# Patient Record
Sex: Female | Born: 1950
Health system: Southern US, Community
[De-identification: ages and names within clinical notes are randomized; demographics above are authoritative.]

## PROBLEM LIST (undated history)

## (undated) DIAGNOSIS — K219 Gastro-esophageal reflux disease without esophagitis: Secondary | ICD-10-CM

## (undated) DIAGNOSIS — J029 Acute pharyngitis, unspecified: Secondary | ICD-10-CM

## (undated) DIAGNOSIS — R05 Cough: Secondary | ICD-10-CM

## (undated) DIAGNOSIS — H269 Unspecified cataract: Secondary | ICD-10-CM

## (undated) DIAGNOSIS — M549 Dorsalgia, unspecified: Secondary | ICD-10-CM

## (undated) DIAGNOSIS — T7840XA Allergy, unspecified, initial encounter: Secondary | ICD-10-CM

## (undated) DIAGNOSIS — C50919 Malignant neoplasm of unspecified site of unspecified female breast: Secondary | ICD-10-CM

## (undated) DIAGNOSIS — R140 Abdominal distension (gaseous): Secondary | ICD-10-CM

## (undated) DIAGNOSIS — M5440 Lumbago with sciatica, unspecified side: Secondary | ICD-10-CM

## (undated) DIAGNOSIS — F419 Anxiety disorder, unspecified: Secondary | ICD-10-CM

## (undated) DIAGNOSIS — R109 Unspecified abdominal pain: Secondary | ICD-10-CM

## (undated) DIAGNOSIS — M1990 Unspecified osteoarthritis, unspecified site: Secondary | ICD-10-CM

## (undated) DIAGNOSIS — R059 Cough, unspecified: Secondary | ICD-10-CM

## (undated) DIAGNOSIS — I499 Cardiac arrhythmia, unspecified: Secondary | ICD-10-CM

## (undated) DIAGNOSIS — R599 Enlarged lymph nodes, unspecified: Secondary | ICD-10-CM

## (undated) DIAGNOSIS — E039 Hypothyroidism, unspecified: Secondary | ICD-10-CM

## (undated) DIAGNOSIS — R002 Palpitations: Secondary | ICD-10-CM

## (undated) DIAGNOSIS — R569 Unspecified convulsions: Secondary | ICD-10-CM

## (undated) DIAGNOSIS — R0981 Nasal congestion: Secondary | ICD-10-CM

## (undated) DIAGNOSIS — C801 Malignant (primary) neoplasm, unspecified: Secondary | ICD-10-CM

## (undated) HISTORY — PX: SPINE SURGERY: SHX786

## (undated) HISTORY — DX: Abdominal distension (gaseous): R14.0

## (undated) HISTORY — DX: Malignant neoplasm of unspecified site of unspecified female breast: C50.919

## (undated) HISTORY — DX: Palpitations: R00.2

## (undated) HISTORY — DX: Unspecified abdominal pain: R10.9

## (undated) HISTORY — DX: Nasal congestion: R09.81

## (undated) HISTORY — DX: Anxiety disorder, unspecified: F41.9

## (undated) HISTORY — DX: Acute pharyngitis, unspecified: J02.9

## (undated) HISTORY — PX: BREAST SURGERY: SHX581

## (undated) HISTORY — DX: Enlarged lymph nodes, unspecified: R59.9

## (undated) HISTORY — DX: Cough: R05

## (undated) HISTORY — PX: WISDOM TOOTH EXTRACTION: SHX21

## (undated) HISTORY — PX: EYE SURGERY: SHX253

## (undated) HISTORY — DX: Allergy, unspecified, initial encounter: T78.40XA

## (undated) HISTORY — DX: Cough, unspecified: R05.9

## (undated) HISTORY — PX: BACK SURGERY: SHX140

## (undated) HISTORY — DX: Unspecified cataract: H26.9

---

## 2001-06-15 ENCOUNTER — Ambulatory Visit (HOSPITAL_COMMUNITY): Admission: RE | Admit: 2001-06-15 | Discharge: 2001-06-15 | Payer: Self-pay | Admitting: Gastroenterology

## 2002-06-22 ENCOUNTER — Ambulatory Visit (HOSPITAL_COMMUNITY): Admission: RE | Admit: 2002-06-22 | Discharge: 2002-06-22 | Payer: Self-pay | Admitting: Chiropractor

## 2002-06-22 ENCOUNTER — Encounter: Payer: Self-pay | Admitting: Chiropractor

## 2004-01-31 ENCOUNTER — Emergency Department (HOSPITAL_COMMUNITY): Admission: EM | Admit: 2004-01-31 | Discharge: 2004-01-31 | Payer: Self-pay | Admitting: Emergency Medicine

## 2005-02-24 ENCOUNTER — Other Ambulatory Visit: Admission: RE | Admit: 2005-02-24 | Discharge: 2005-02-24 | Payer: Self-pay | Admitting: Obstetrics and Gynecology

## 2005-04-28 ENCOUNTER — Encounter: Admission: RE | Admit: 2005-04-28 | Discharge: 2005-04-28 | Payer: Self-pay | Admitting: Gastroenterology

## 2005-08-19 ENCOUNTER — Encounter: Admission: RE | Admit: 2005-08-19 | Discharge: 2005-08-19 | Payer: Self-pay | Admitting: Gastroenterology

## 2010-03-14 ENCOUNTER — Ambulatory Visit (HOSPITAL_COMMUNITY)
Admission: RE | Admit: 2010-03-14 | Discharge: 2010-03-14 | Payer: Self-pay | Source: Home / Self Care | Attending: Physical Medicine and Rehabilitation | Admitting: Physical Medicine and Rehabilitation

## 2010-04-13 ENCOUNTER — Encounter: Payer: Self-pay | Admitting: Physical Medicine and Rehabilitation

## 2010-08-08 NOTE — Procedures (Signed)
[place]  Patient:    Virginia [name], Virginia [name] Visit Number: [id number] MRN: [id number]          Service Type: END Location: ENDO Attending Physician:  Charna Elizabeth Dictated by:   Anselmo Rod, M.D. Proc. Date: 06/15/01 Admit Date:  06/15/2001   CC:         Tammy R. Collins [place], M.D., [facility]   Procedure Report  DATE OF BIRTH:  1950-05-13  PROCEDURE PERFORMED:  Colonoscopy.  ENDOSCOPIST:  Anselmo Rod, M.D.  INSTRUMENT USED:  Olympus video colonoscope.  INDICATIONS:  Blood in stool and change in bowel habits in a 61 year old white female.  Rule out colonic polyps, masses, hemorrhoids, etc.  PREPROCEDURE PREPARATION:  Informed consent was procured from the patient. The patient was fasted for 8 hours prior to the procedure and prepped with a bottle of magnesium citrate and a gallon of NuLytely the night prior to the procedure.  PREPROCEDURE PHYSICAL:  Patient has stable vital signs.  NECK: Supple.  CHEST:  Clear to auscultation. S1, S2 regular.  ABDOMEN:  Soft with normal bowel sounds.  DESCRIPTION OF PROCEDURE:  The patient was placed in the left lateral decubitus position and sedated with 80 mg of fentanyl and 7 mg of Versed intravenously.  Once the patient was adequately sedated and maintained on low-flow oxygen and continuous cardiac monitoring, the Olympus video colonoscope was advanced from the rectum to the cecum with slight difficulty. The patient had discomfort with passage of the scope at the hepatic flexure. This may be secondary to adhesions.  No masses or polyps were seen.  The entire colonic mucosa seemed healthy with a normal vascular pattern.  There was no evidence of diverticulosis.  The appendiceal orifice and the ileocecal valve were clearly visualized and photographed.  Small internal hemorrhoids were appreciated on retroflexion in the rectum.  IMPRESSION: 1. Healthy-appearing colonic  mucosa up to the cecum except for small    nonbleeding, internal hemorrhoids. 2. Somewhat tortuous colon with slight difficulty in the passage of the scope    at the hepatic flexure, question adhesions.  RECOMMENDATIONS: 1. A high-fiber diet has been recommended for the patient. 2. Outpatient follow-up is advised in the next four weeks. 3. Repeat colorectal cancer screening was recommended in the next 5-10 years    unless the patient were develop any abnormal symptoms in the interim. Dictated by:   Anselmo Rod, M.D. Attending Physician:  Charna Elizabeth DD:  06/15/01 TD:  06/16/01 Job: [id number]

## 2011-10-03 ENCOUNTER — Encounter (HOSPITAL_COMMUNITY): Admission: EM | Disposition: A | Discharge status: Home / Self Care | Payer: Self-pay | Source: Home / Self Care

## 2011-10-03 ENCOUNTER — Encounter (HOSPITAL_COMMUNITY): Payer: Self-pay | Admitting: Anesthesiology

## 2011-10-03 ENCOUNTER — Emergency Department (HOSPITAL_COMMUNITY): Payer: Medicaid Other | Admitting: Anesthesiology

## 2011-10-03 ENCOUNTER — Emergency Department (HOSPITAL_COMMUNITY): Payer: Medicaid Other

## 2011-10-03 ENCOUNTER — Encounter (HOSPITAL_COMMUNITY): Payer: Self-pay | Admitting: Emergency Medicine

## 2011-10-03 ENCOUNTER — Ambulatory Visit (HOSPITAL_COMMUNITY)
Admission: EM | Admit: 2011-10-03 | Discharge: 2011-10-05 | Disposition: A | Discharge status: Home / Self Care | Payer: Medicaid Other | Attending: General Surgery | Admitting: General Surgery

## 2011-10-03 DIAGNOSIS — R1031 Right lower quadrant pain: Secondary | ICD-10-CM | POA: Insufficient documentation

## 2011-10-03 DIAGNOSIS — K3580 Unspecified acute appendicitis: Secondary | ICD-10-CM

## 2011-10-03 HISTORY — PX: APPENDECTOMY: SHX54

## 2011-10-03 HISTORY — DX: Lumbago with sciatica, unspecified side: M54.40

## 2011-10-03 HISTORY — PX: LAPAROSCOPIC APPENDECTOMY: SHX408

## 2011-10-03 HISTORY — DX: Dorsalgia, unspecified: M54.9

## 2011-10-03 LAB — CBC WITH DIFFERENTIAL/PLATELET
Basophils Relative: 0 % (ref 0–1)
Eosinophils Absolute: 0 10*3/uL (ref 0.0–0.7)
Eosinophils Relative: 0 % (ref 0–5)
HCT: 39.8 % (ref 36.0–46.0)
Hemoglobin: 13.7 g/dL (ref 12.0–15.0)
Lymphocytes Relative: 9 % — ABNORMAL LOW (ref 12–46)
Lymphs Abs: 1 10*3/uL (ref 0.7–4.0)
MCH: 31.1 pg (ref 26.0–34.0)
MCHC: 34.4 g/dL (ref 30.0–36.0)
MCV: 90.2 fL (ref 78.0–100.0)
Monocytes Relative: 6 % (ref 3–12)
Neutrophils Relative %: 85 % — ABNORMAL HIGH (ref 43–77)
RBC: 4.41 MIL/uL (ref 3.87–5.11)
RDW: 12.3 % (ref 11.5–15.5)
WBC: 10.9 10*3/uL — ABNORMAL HIGH (ref 4.0–10.5)

## 2011-10-03 LAB — COMPREHENSIVE METABOLIC PANEL
ALT: 24 U/L (ref 0–35)
Alkaline Phosphatase: 115 U/L (ref 39–117)
BUN: 8 mg/dL (ref 6–23)
CO2: 24 mEq/L (ref 19–32)
Calcium: 10.1 mg/dL (ref 8.4–10.5)
Creatinine, Ser: 0.55 mg/dL (ref 0.50–1.10)
GFR calc Af Amer: >90 mL/min (ref >90)
GFR calc non Af Amer: >90 mL/min (ref >90)
Glucose, Bld: 116 mg/dL — ABNORMAL HIGH (ref 70–99)
Potassium: 3.6 mEq/L (ref 3.5–5.1)
Sodium: 138 mEq/L (ref 135–145)
Total Bilirubin: 0.4 mg/dL (ref 0.3–1.2)
Total Protein: 7.3 g/dL (ref 6.0–8.3)

## 2011-10-03 LAB — LIPASE, BLOOD: Lipase: 26 U/L (ref 11–59)

## 2011-10-03 SURGERY — APPENDECTOMY, LAPAROSCOPIC
Anesthesia: General | Site: Abdomen | Wound class: Contaminated

## 2011-10-03 MED ORDER — VECURONIUM BROMIDE 10 MG IV SOLR
INTRAVENOUS | Status: DC | PRN
  Administered 2011-10-03: 5 mg via INTRAVENOUS

## 2011-10-03 MED ORDER — BUPIVACAINE-EPINEPHRINE 0.25% -1:200000 IJ SOLN
INTRAMUSCULAR | Status: DC | PRN
  Administered 2011-10-03: 15 mL

## 2011-10-03 MED ORDER — MORPHINE SULFATE 4 MG/ML IJ SOLN
4.0000 mg | Freq: Once | INTRAMUSCULAR | Status: AC
  Administered 2011-10-03: 4 mg via INTRAVENOUS
  Filled 2011-10-03: qty 1

## 2011-10-03 MED ORDER — ONDANSETRON HCL 4 MG/2ML IJ SOLN
4.0000 mg | Freq: Once | INTRAMUSCULAR | Status: AC
  Administered 2011-10-03: 4 mg via INTRAVENOUS
  Filled 2011-10-03: qty 2

## 2011-10-03 MED ORDER — MIDAZOLAM HCL 5 MG/5ML IJ SOLN
INTRAMUSCULAR | Status: DC | PRN
  Administered 2011-10-03: 2 mg via INTRAVENOUS

## 2011-10-03 MED ORDER — ONDANSETRON HCL 4 MG/2ML IJ SOLN
INTRAMUSCULAR | Status: DC | PRN
  Administered 2011-10-03: 4 mg via INTRAVENOUS

## 2011-10-03 MED ORDER — ONDANSETRON HCL 4 MG/2ML IJ SOLN
4.0000 mg | Freq: Once | INTRAMUSCULAR | Status: AC
  Administered 2011-10-03: 4 mg via INTRAVENOUS

## 2011-10-03 MED ORDER — ACETAMINOPHEN 10 MG/ML IV SOLN
INTRAVENOUS | Status: DC | PRN
  Administered 2011-10-03: 1000 mg via INTRAVENOUS

## 2011-10-03 MED ORDER — KCL IN DEXTROSE-NACL 20-5-0.45 MEQ/L-%-% IV SOLN
INTRAVENOUS | Status: AC
  Filled 2011-10-03: qty 1000

## 2011-10-03 MED ORDER — SUCCINYLCHOLINE CHLORIDE 20 MG/ML IJ SOLN
INTRAMUSCULAR | Status: DC | PRN
  Administered 2011-10-03: 140 mg via INTRAVENOUS

## 2011-10-03 MED ORDER — FENTANYL CITRATE 0.05 MG/ML IJ SOLN
INTRAMUSCULAR | Status: DC | PRN
  Administered 2011-10-03: 50 ug via INTRAVENOUS
  Administered 2011-10-03 (×3): 100 ug via INTRAVENOUS

## 2011-10-03 MED ORDER — DROPERIDOL 2.5 MG/ML IJ SOLN
INTRAMUSCULAR | Status: DC | PRN
  Administered 2011-10-03: 0.625 mg via INTRAVENOUS

## 2011-10-03 MED ORDER — PROPOFOL 10 MG/ML IV EMUL
INTRAVENOUS | Status: DC | PRN
  Administered 2011-10-03: 120 mg via INTRAVENOUS

## 2011-10-03 MED ORDER — SODIUM CHLORIDE 0.9 % IR SOLN
Status: DC | PRN
  Administered 2011-10-03: 1000 mL

## 2011-10-03 MED ORDER — SODIUM CHLORIDE 0.9 % IV SOLN
INTRAVENOUS | Status: DC | PRN
  Administered 2011-10-03: 18:00:00 via INTRAVENOUS

## 2011-10-03 MED ORDER — MORPHINE SULFATE 2 MG/ML IJ SOLN: 2.0000 mg | INTRAMUSCULAR | Status: DC | PRN

## 2011-10-03 MED ORDER — ZOLPIDEM TARTRATE 5 MG PO TABS
5.0000 mg | ORAL_TABLET | Freq: Every day | ORAL | Status: DC
  Administered 2011-10-04 (×2): 5 mg via ORAL
  Filled 2011-10-03 (×2): qty 1

## 2011-10-03 MED ORDER — MOXIFLOXACIN HCL IN NACL 400 MG/250ML IV SOLN
400.0000 mg | Freq: Once | INTRAVENOUS | Status: AC
  Administered 2011-10-03: 400 mg via INTRAVENOUS
  Filled 2011-10-03: qty 250

## 2011-10-03 MED ORDER — HYDROMORPHONE HCL PF 1 MG/ML IJ SOLN
0.2500 mg | INTRAMUSCULAR | Status: DC | PRN
  Administered 2011-10-03 (×2): 0.5 mg via INTRAVENOUS

## 2011-10-03 MED ORDER — METOCLOPRAMIDE HCL 5 MG/ML IJ SOLN
INTRAMUSCULAR | Status: DC | PRN
  Administered 2011-10-03: 10 mg via INTRAVENOUS

## 2011-10-03 MED ORDER — SODIUM CHLORIDE 0.9 % IV SOLN
Freq: Once | INTRAVENOUS | Status: AC
  Administered 2011-10-03: 1000 mL via INTRAVENOUS

## 2011-10-03 MED ORDER — LIDOCAINE HCL (CARDIAC) 20 MG/ML IV SOLN
INTRAVENOUS | Status: DC | PRN
  Administered 2011-10-03: 100 mg via INTRAVENOUS

## 2011-10-03 MED ORDER — ONDANSETRON HCL 4 MG/2ML IJ SOLN: 4.0000 mg | Freq: Once | INTRAMUSCULAR | Status: DC | PRN

## 2011-10-03 MED ORDER — OXYCODONE-ACETAMINOPHEN 5-325 MG PO TABS
1.0000 | ORAL_TABLET | ORAL | Status: DC | PRN
  Administered 2011-10-04: 1 via ORAL
  Administered 2011-10-04: 2 via ORAL
  Administered 2011-10-04: 1 via ORAL
  Administered 2011-10-04 (×2): 2 via ORAL
  Administered 2011-10-04: 1 via ORAL
  Administered 2011-10-05: 2 via ORAL
  Filled 2011-10-03 (×2): qty 2
  Filled 2011-10-03: qty 1
  Filled 2011-10-03 (×3): qty 2

## 2011-10-03 MED ORDER — ONDANSETRON HCL 4 MG/2ML IJ SOLN
INTRAMUSCULAR | Status: AC
  Filled 2011-10-03: qty 2

## 2011-10-03 MED ORDER — DEXAMETHASONE SODIUM PHOSPHATE 4 MG/ML IJ SOLN
INTRAMUSCULAR | Status: DC | PRN
  Administered 2011-10-03: 4 mg via INTRAVENOUS

## 2011-10-03 MED ORDER — IOHEXOL 300 MG/ML  SOLN
100.0000 mL | Freq: Once | INTRAMUSCULAR | Status: AC | PRN
  Administered 2011-10-03: 100 mL via INTRAVENOUS

## 2011-10-03 MED ORDER — IOHEXOL 300 MG/ML  SOLN: 20.0000 mL | INTRAMUSCULAR | Status: DC

## 2011-10-03 MED ORDER — KCL IN DEXTROSE-NACL 20-5-0.45 MEQ/L-%-% IV SOLN
INTRAVENOUS | Status: DC
  Administered 2011-10-03 – 2011-10-04 (×3): via INTRAVENOUS
  Filled 2011-10-03 (×5): qty 1000

## 2011-10-03 MED ORDER — GLYCOPYRROLATE 0.2 MG/ML IJ SOLN
INTRAMUSCULAR | Status: DC | PRN
  Administered 2011-10-03: .6 mg via INTRAVENOUS

## 2011-10-03 MED ORDER — ONDANSETRON HCL 4 MG/2ML IJ SOLN: 4.0000 mg | Freq: Four times a day (QID) | INTRAMUSCULAR | Status: DC | PRN

## 2011-10-03 MED ORDER — ACETAMINOPHEN 325 MG PO TABS: 650.0000 mg | ORAL_TABLET | ORAL | Status: DC | PRN

## 2011-10-03 MED ORDER — HEPARIN SODIUM (PORCINE) 5000 UNIT/ML IJ SOLN
5000.0000 [IU] | Freq: Three times a day (TID) | INTRAMUSCULAR | Status: DC
  Administered 2011-10-04 – 2011-10-05 (×4): 5000 [IU] via SUBCUTANEOUS
  Filled 2011-10-03 (×7): qty 1

## 2011-10-03 MED ORDER — LACTATED RINGERS IV SOLN
INTRAVENOUS | Status: DC | PRN
  Administered 2011-10-03: 19:00:00 via INTRAVENOUS

## 2011-10-03 MED ORDER — NEOSTIGMINE METHYLSULFATE 1 MG/ML IJ SOLN
INTRAMUSCULAR | Status: DC | PRN
  Administered 2011-10-03: 5 mg via INTRAVENOUS

## 2011-10-03 MED ORDER — ONDANSETRON HCL 4 MG PO TABS: 4.0000 mg | ORAL_TABLET | Freq: Four times a day (QID) | ORAL | Status: DC | PRN

## 2011-10-03 SURGICAL SUPPLY — 49 items
ADH SKN CLS APL DERMABOND .7 (GAUZE/BANDAGES/DRESSINGS) ×1
ADH SKN CLS LQ APL DERMABOND (GAUZE/BANDAGES/DRESSINGS) ×1
APPLIER CLIP ROT 10 11.4 M/L (STAPLE)
APR CLP MED LRG 11.4X10 (STAPLE)
BAG SPEC RTRVL LRG 6X4 10 (ENDOMECHANICALS) ×1
BLADE SURG ROTATE 9660 (MISCELLANEOUS) IMPLANT
CANISTER SUCTION 2500CC (MISCELLANEOUS) ×2 IMPLANT
CHLORAPREP W/TINT 26ML (MISCELLANEOUS) ×2 IMPLANT
CLIP APPLIE ROT 10 11.4 M/L (STAPLE) IMPLANT
CLOTH BEACON ORANGE TIMEOUT ST (SAFETY) ×2 IMPLANT
COVER SURGICAL LIGHT HANDLE (MISCELLANEOUS) ×2 IMPLANT
CUTTER LINEAR ENDO 35 ETS (STAPLE) IMPLANT
CUTTER LINEAR ENDO 35 ETS TH (STAPLE) ×2 IMPLANT
DECANTER SPIKE VIAL GLASS SM (MISCELLANEOUS) ×2 IMPLANT
DERMABOND ADHESIVE PROPEN (GAUZE/BANDAGES/DRESSINGS) ×1
DERMABOND ADVANCED (GAUZE/BANDAGES/DRESSINGS) ×1
DERMABOND ADVANCED .7 DNX12 (GAUZE/BANDAGES/DRESSINGS) ×1 IMPLANT
DERMABOND ADVANCED .7 DNX6 (GAUZE/BANDAGES/DRESSINGS) ×1 IMPLANT
DRAPE UTILITY 15X26 W/TAPE STR (DRAPE) ×4 IMPLANT
ELECT REM PT RETURN 9FT ADLT (ELECTROSURGICAL) ×2
ELECTRODE REM PT RTRN 9FT ADLT (ELECTROSURGICAL) ×1 IMPLANT
ENDOLOOP SUT PDS II  0 18 (SUTURE)
ENDOLOOP SUT PDS II 0 18 (SUTURE) IMPLANT
GLOVE BIO SURGEON STRL SZ8 (GLOVE) ×2 IMPLANT
GLOVE BIOGEL PI IND STRL 8 (GLOVE) ×1 IMPLANT
GLOVE BIOGEL PI INDICATOR 8 (GLOVE) ×1
GOWN PREVENTION PLUS XLARGE (GOWN DISPOSABLE) ×2 IMPLANT
GOWN STRL NON-REIN LRG LVL3 (GOWN DISPOSABLE) ×4 IMPLANT
KIT BASIN OR (CUSTOM PROCEDURE TRAY) ×2 IMPLANT
KIT ROOM TURNOVER OR (KITS) ×2 IMPLANT
NS IRRIG 1000ML POUR BTL (IV SOLUTION) ×2 IMPLANT
PAD ARMBOARD 7.5X6 YLW CONV (MISCELLANEOUS) ×4 IMPLANT
POUCH SPECIMEN RETRIEVAL 10MM (ENDOMECHANICALS) ×2 IMPLANT
RELOAD /EVU35 (ENDOMECHANICALS) IMPLANT
RELOAD CUTTER ETS 35MM STAND (ENDOMECHANICALS) IMPLANT
SCALPEL HARMONIC ACE (MISCELLANEOUS) ×2 IMPLANT
SET IRRIG TUBING LAPAROSCOPIC (IRRIGATION / IRRIGATOR) ×2 IMPLANT
SPECIMEN JAR SMALL (MISCELLANEOUS) ×2 IMPLANT
SUT VIC AB 4-0 PS2 27 (SUTURE) ×2 IMPLANT
SWAB COLLECTION DEVICE MRSA (MISCELLANEOUS) IMPLANT
TOWEL OR 17X24 6PK STRL BLUE (TOWEL DISPOSABLE) ×2 IMPLANT
TOWEL OR 17X26 10 PK STRL BLUE (TOWEL DISPOSABLE) ×2 IMPLANT
TRAY FOLEY CATH 14FR (SET/KITS/TRAYS/PACK) ×2 IMPLANT
TRAY LAPAROSCOPIC (CUSTOM PROCEDURE TRAY) ×2 IMPLANT
TROCAR HASSON GELL 12X100 (TROCAR) ×2 IMPLANT
TROCAR Z-THREAD FIOS 12X100MM (TROCAR) ×2 IMPLANT
TROCAR Z-THREAD FIOS 5X100MM (TROCAR) ×2 IMPLANT
TUBE ANAEROBIC SPECIMEN COL (MISCELLANEOUS) IMPLANT
WATER STERILE IRR 1000ML POUR (IV SOLUTION) ×2 IMPLANT

## 2011-10-03 NOTE — H&P (Signed)
Virginia [name] is an 61 y.o. female.   Chief Complaint: Abdominal pain HPI: Patient developed some vague lower abdominal pain a couple days ago. Last night, however, it worsened significantly. It was across her midabdomen. She had associated nausea and vomiting. She came to the emergency department for evaluation. Since that time it is more localized to the right lower quadrant. Evaluation in the emergency department included CT scan of the abdomen and pelvis which reveals an abnormal appendix with possible mucocele and appendicoliths present. No evidence of perforation.Of note, patient sees Dr. Valerie Roys for primary care and regarding her complex environmental allergies.  Past Medical History  Diagnosis Date  . Back pain with radiation   . Low back pain with sciatica     Past surgical history: Cesarean section  History reviewed. No pertinent family history. Social History:  reports that she has never smoked. She does not have any smokeless tobacco history on file. She reports that she drinks alcohol. She reports that she does not use illicit drugs.  Allergies:  Allergies  Allergen Reactions  . Phenergan (Promethazine) Other (See Comments)    Thinks it was a seizure     (Not in a hospital admission)  Results for orders placed during the hospital encounter of 10/03/11 (from the past 48 hour(s))  CBC WITH DIFFERENTIAL     Status: Abnormal   Collection Time   10/03/11 12:05 PM      Component Value Range Comment   WBC 10.9 (*) 4.0 - 10.5 K/uL    RBC 4.41  3.87 - 5.11 MIL/uL    Hemoglobin 13.7  12.0 - 15.0 g/dL    HCT 40.9  81.1 - 91.4 %    MCV 90.2  78.0 - 100.0 fL    MCH 31.1  26.0 - 34.0 pg    MCHC 34.4  30.0 - 36.0 g/dL    RDW 78.2  95.6 - 21.3 %    Platelets 248  150 - 400 K/uL    Neutrophils Relative 85 (*) 43 - 77 %    Neutro Abs 9.3 (*) 1.7 - 7.7 K/uL    Lymphocytes Relative 9 (*) 12 - 46 %    Lymphs Abs 1.0  0.7 - 4.0 K/uL    Monocytes Relative 6  3 - 12 %    Monocytes Absolute 0.6  0.1 - 1.0 K/uL    Eosinophils Relative 0  0 - 5 %    Eosinophils Absolute 0.0  0.0 - 0.7 K/uL    Basophils Relative 0  0 - 1 %    Basophils Absolute 0.0  0.0 - 0.1 K/uL   COMPREHENSIVE METABOLIC PANEL     Status: Abnormal   Collection Time   10/03/11 12:05 PM      Component Value Range Comment   Sodium 138  135 - 145 mEq/L    Potassium 3.6  3.5 - 5.1 mEq/L    Chloride 101  96 - 112 mEq/L    CO2 24  19 - 32 mEq/L    Glucose, Bld 116 (*) 70 - 99 mg/dL    BUN 8  6 - 23 mg/dL    Creatinine, Ser 0.86  0.50 - 1.10 mg/dL    Calcium 57.8  8.4 - 10.5 mg/dL    Total Protein 7.3  6.0 - 8.3 g/dL    Albumin 4.1  3.5 - 5.2 g/dL    AST 25  0 - 37 U/L    ALT 24  0 - 35  U/L    Alkaline Phosphatase 115  39 - 117 U/L    Total Bilirubin 0.4  0.3 - 1.2 mg/dL    GFR calc non Af Amer >90  >90 mL/min    GFR calc Af Amer >90  >90 mL/min   LIPASE, BLOOD     Status: Normal   Collection Time   10/03/11 12:05 PM      Component Value Range Comment   Lipase 26  11 - 59 U/L    Ct Abdomen Pelvis W Contrast  10/03/2011  *RADIOLOGY REPORT*  Clinical Data: Abdominal pain.  Nausea and vomiting.  Diarrhea.  CT ABDOMEN AND PELVIS WITH CONTRAST  Technique:  Multidetector CT imaging of the abdomen and pelvis was performed following the standard protocol during bolus administration of intravenous contrast.  Contrast: OMNIPAQUE IOHEXOL 300 MG/ML  SOLN  Comparison: None.  Findings: Mild periportal edema is seen, however no liver masses are identified.  No evidence of ascites or splenomegaly.  Mild gallbladder wall thickening is seen likely related to periportal edema.  There is no evidence of gallbladder dilatation or pericholecystic inflammatory changes.  The pancreas, adrenal glands, and kidneys are normal in appearance. No evidence of hydronephrosis.  No soft tissue masses or lymphadenopathy identified within the abdomen or pelvis.  Uterus and adnexa are unremarkable in appearance.  Small amount  of free fluid is noted in the pelvic cul-de-sac, which is nonspecific.  The appendix is enlarged and contains multiple appendicoliths.  The appendix measures up to 14 mm in diameter although there is no significant inflammatory changes seen within the periappendiceal fat.  No other inflammatory process or abscess identified.  No evidence of bowel wall thickening or dilatation.  IMPRESSION:  1. Enlarged appendix with multiple appendicoliths, but no significant periappendiceal inflammatory change. Differential diagnosis includes early acute appendicitis and mucocele. Recommend correlation with clinical exam and surgical consultation. 2.  Mild nonspecific periportal edema and small amount of pelvic ascites.  This may be seen with hepatocellular disease, and correlation with liver function test is recommended.  Original Report Authenticated By: Danae Orleans, M.D.    Review of Systems  Constitutional: Positive for malaise/fatigue.  HENT: Negative.   Eyes: Negative.   Respiratory: Negative.   Cardiovascular: Negative.   Gastrointestinal: Positive for nausea, vomiting and abdominal pain. Negative for constipation and blood in stool.  Genitourinary: Negative.   Musculoskeletal: Positive for back pain.       Back pain is chronic and lower back surgery is planned for the future with Dr. Yetta Barre from neurosurgery  Skin: Negative.   Neurological: Negative.   Endo/Heme/Allergies:       Significant environmental allergies    Blood pressure 142/79, pulse 61, temperature 98.1 F (36.7 C), temperature source Oral, resp. rate 18, SpO2 99.00%. Physical Exam  Constitutional: She is oriented to person, place, and time. She appears well-developed and well-nourished. No distress.  HENT:  Head: Normocephalic and atraumatic.  Mouth/Throat: No oropharyngeal exudate.  Eyes: EOM are normal. Pupils are equal, round, and reactive to light. No scleral icterus.  Neck: Normal range of motion. No tracheal deviation present.   Cardiovascular: Normal rate, regular rhythm, normal heart sounds and intact distal pulses.   No murmur heard. Respiratory: Effort normal and breath sounds normal. No stridor. No respiratory distress. She has no wheezes. She has no rales.  GI: Soft. She exhibits distension. There is tenderness. There is no rebound and no guarding.       Tenderness right lower quadrant  without guarding, no masses, mild distention, positive Rosvig's sign  Musculoskeletal: Normal range of motion.       Lower back discomfort  Neurological: She is alert and oriented to person, place, and time.       Speech fluent, mood appropriate  Skin: Skin is warm and dry.     Assessment/Plan Right lower quadrant abdominal pain with abnormal appendix on CT scan. Plan laparoscopic appendectomy. We'll give IV antibiotics. Procedure, risks, and benefits were discussed in detail with the patient and her husband. I also discussed the fact that this mucin collection may represent a tumor Of the appendix. We will check in pathology. I answered their questions.  Duy Lemming E 10/03/2011, 5:05 PM

## 2011-10-03 NOTE — Anesthesia Procedure Notes (Signed)
Procedure Name: Intubation Date/Time: 10/03/2011 6:12 PM Performed by: Wray Kearns A Pre-anesthesia Checklist: Patient identified, Timeout performed, Suction available, Emergency Drugs available and Patient being monitored Patient Re-evaluated:Patient Re-evaluated prior to inductionOxygen Delivery Method: Circle system utilized Preoxygenation: Pre-oxygenation with 100% oxygen Intubation Type: IV induction, Rapid sequence and Cricoid Pressure applied Laryngoscope Size: Mac and 3 Grade View: Grade I Tube type: Oral Tube size: 7.5 mm Number of attempts: 1 Airway Equipment and Method: Stylet Placement Confirmation: ETT inserted through vocal cords under direct vision,  breath sounds checked- equal and bilateral and positive ETCO2 Secured at: 22 cm Tube secured with: Tape Dental Injury: Teeth and Oropharynx as per pre-operative assessment

## 2011-10-03 NOTE — Preoperative (Signed)
Beta Blockers   Reason not to administer Beta Blockers:Not Applicable 

## 2011-10-03 NOTE — Transfer of Care (Signed)
Immediate Anesthesia Transfer of Care Note  Patient: Virginia [name]  Procedure(s) Performed: Procedure(s) (LRB): APPENDECTOMY LAPAROSCOPIC (N/A)  Patient Location: PACU  Anesthesia Type: General  Level of Consciousness: oriented, sedated, patient cooperative and responds to stimulation  Airway & Oxygen Therapy: Patient Spontanous Breathing and Patient connected to nasal cannula oxygen  Post-op Assessment: Report given to PACU RN, Post -op Vital signs reviewed and stable, Patient moving all extremities and Patient moving all extremities X 4  Post vital signs: Reviewed and stable  Complications: No apparent anesthesia complications

## 2011-10-03 NOTE — ED Notes (Signed)
Pt c/o upper abdominal pain onset 0030 with N/V. Pt reports loose stools x 6 within 24 hours.

## 2011-10-03 NOTE — Anesthesia Preprocedure Evaluation (Signed)
Anesthesia Evaluation  Patient identified by MRN, date of birth, ID band Patient awake    Reviewed: H&P , NPO status , Patient's Chart, lab work & pertinent test results  Airway Mallampati: I TM Distance: >3 FB Neck ROM: Full    Dental   Pulmonary          Cardiovascular     Neuro/Psych    GI/Hepatic   Endo/Other    Renal/GU      Musculoskeletal   Abdominal   Peds  Hematology   Anesthesia Other Findings   Reproductive/Obstetrics                           Anesthesia Physical Anesthesia Plan  ASA: II  Anesthesia Plan: General   Post-op Pain Management:    Induction: Intravenous, Rapid sequence and Cricoid pressure planned  Airway Management Planned: Oral ETT  Additional Equipment:   Intra-op Plan:   Post-operative Plan: Extubation in OR  Informed Consent: I have reviewed the patients History and Physical, chart, labs and discussed the procedure including the risks, benefits and alternatives for the proposed anesthesia with the patient or authorized representative who has indicated his/her understanding and acceptance.     Plan Discussed with: CRNA and Surgeon  Anesthesia Plan Comments:         Anesthesia Quick Evaluation

## 2011-10-03 NOTE — ED Provider Notes (Signed)
History     CSN: [id number]  Arrival date & time 10/03/11  1134   First MD Initiated Contact with Patient 10/03/11 1146      Chief Complaint  Patient presents with  . Abdominal Pain  . Emesis    (Consider location/radiation/quality/duration/timing/severity/associated sxs/prior treatment) HPI Comments: Started late last night with pain in the upper abdomen along with n/v and loose stools.  She believes she may have eaten "a bad piece of protein".  She has had similar episodes in the past but this seems to be much worse than what she experienced before.  Patient is a 61 y.o. female presenting with abdominal pain. The history is provided by the patient.  Abdominal Pain The primary symptoms of the illness include nausea. The primary symptoms of the illness do not include dysuria. The onset of the illness was sudden. The problem has been rapidly worsening.  The patient has had a change in bowel habit. Significant associated medical issues do not include gallstones.    History reviewed. No pertinent past medical history.  History reviewed. No pertinent past surgical history.  History reviewed. No pertinent family history.  History  Substance Use Topics  . Smoking status: Never Smoker   . Smokeless tobacco: Not on file  . Alcohol Use: Yes    OB History    Grav Para Term Preterm Abortions TAB SAB Ect Mult Living                  Review of Systems  Gastrointestinal: Positive for nausea.  Genitourinary: Negative for dysuria.  All other systems reviewed and are negative.    Allergies  Phenergan  Home Medications   Current Outpatient Rx  Name Route Sig Dispense Refill  . OXYCODONE-ACETAMINOPHEN 5-325 MG PO TABS Oral Take 0.5 tablets by mouth every 6 (six) hours as needed. pain    . THYROID 120 MG PO TABS Oral Take 120 mg by mouth daily.    Marland Kitchen ZOLPIDEM TARTRATE 10 MG PO TABS Oral Take 5 mg by mouth at bedtime. sleep      BP 147/83  Pulse 70  Temp 98.1 F (36.7 C)  (Oral)  Resp 18  SpO2 100%  Physical Exam  Nursing note and vitals reviewed. Constitutional: She is oriented to person, place, and time. She appears well-developed and well-nourished. No distress.  HENT:  Head: Normocephalic and atraumatic.  Neck: Normal range of motion. Neck supple.  Cardiovascular: Normal rate and regular rhythm.   No murmur heard. Pulmonary/Chest: Effort normal and breath sounds normal. No respiratory distress.  Abdominal: Soft.       There is ttp in the upper right and left abdomen and epigastric areas.  There is no rebound or guarding.  Bowel sounds are present.  Musculoskeletal: Normal range of motion. She exhibits no edema.  Lymphadenopathy:    She has no cervical adenopathy.  Neurological: She is alert and oriented to person, place, and time.  Skin: Skin is warm and dry. She is not diaphoretic.    ED Course  Procedures (including critical care time)   Labs Reviewed  CBC WITH DIFFERENTIAL  COMPREHENSIVE METABOLIC PANEL  LIPASE, BLOOD   No results found.   No diagnosis found.   Date: 10/03/2011  Rate: 80's  Rhythm: normal sinus rhythm  QRS Axis: normal  Intervals: normal  ST/T Wave abnormalities: normal  Conduction Disutrbances:none  Narrative Interpretation:   Old EKG Reviewed: unchanged    MDM  The patient presents with abd pain, vomiting.  The workup reveals a mildly elevated wbc but no fever.  She is ttp in the RLQ, but appears most tender in the RUQ.  The ct reveals an enlarged appendix but no periappendiceal inflammation.  The question of early appendicitis versus mucocele has been raised.  As such, I have consulted Dr. Janee Morn from surgery who will see the patient.          Geoffery Lyons, MD 10/03/11 [phone]

## 2011-10-03 NOTE — Op Note (Signed)
10/03/2011  7:01 PM  PATIENT:  Virginia [name]  61 y.o. female  PRE-OPERATIVE DIAGNOSIS:  acute appendicitis  POST-OPERATIVE DIAGNOSIS:  acute appendicitis  PROCEDURE:  Procedure(s): APPENDECTOMY LAPAROSCOPIC  SURGEON:  Surgeon(s): Liz Malady, MD  PHYSICIAN ASSISTANT:   ASSISTANTS: none   ANESTHESIA:   general  EBL:     BLOOD ADMINISTERED:none  DRAINS: none   SPECIMEN:  excision  DISPOSITION OF SPECIMEN:  PATHOLOGY  COUNTS:  YES  DICTATION: .Dragon DictationPatient presented to the emergency department with generalized abdominal pain that has gradually localized to the right lower quadrant. CT scan demonstrated abnormal appendix. She is brought for emergency appendectomy. She was identified in the preop holding area. She received intravenous antibiotics. She was brought to the operating room. General endotracheal anesthesia was administered by the anesthesia staff. Her abdomen was prepped and draped in sterile fashion after nursing staff placed a Foley catheter. Time out procedure was done. Infraumbilical region was infiltrated with quarter percent Marcaine with epinephrine. Infraumbilical incision was made. Subcutaneous tissues were dissected down revealing the anterior fascia. This was divided sharply along the midline. 0 Vicryl pursestring suture was placed. Hassan trocar was inserted into the abdomen. Abdomen was insufflated with carbon dioxide in standard fashion. Laparoscopic exploration revealed a tense and distended appendix. Under direct vision, a 12 mm left lower quadrant and a 5 mm right mid abdomen port were placed. Local was used at these port sites. The mesoappendix was divided with the harmonic scalpel achieving excellent hemostasis. The base of the appendix was not inflamed. It was divided with Endo GIA with vascular load. There was good closure of the staple line. Appendix was placed in an Endo Catch bag and removed from the abdomen via the left lower quadrant  port site. Abdomen was copiously irrigated. One tiny spot of bleeding along the staple line was cauterized. There was no further bleeding. Mesoappendix was dry as well. Staple line remained intact. Irrigation fluid returned clear. Ports were removed under direct vision. Pneumoperitoneum was released. Informed local fascia was closed by tying the pursestring suture with care not to trap the intraconal contents. All 3 wounds were copiously irrigated. Skin of each was closed with 4-0 Vicryl subcuticular stitch followed by Dermabond. All counts were correct. Patient tolerated procedure well without apparent complication and was taken recovery in stable condition.  PATIENT DISPOSITION:  PACU - hemodynamically stable.   Delay start of Pharmacological VTE agent (>24hrs) due to surgical blood loss or risk of bleeding:  no  Violeta Gelinas, MD, MPH, FACS Pager: [phone]  7/13/20137:01 PM

## 2011-10-03 NOTE — Plan of Care (Signed)
Problem: Phase I Progression Outcomes Goal: Sutures/staples intact Outcome: Completed/Met Date Met:  10/03/11 Incisions closed with dermabond surgical skin glue

## 2011-10-04 ENCOUNTER — Encounter (HOSPITAL_COMMUNITY): Payer: Self-pay | Admitting: Anesthesiology

## 2011-10-04 NOTE — Anesthesia Postprocedure Evaluation (Signed)
Anesthesia Post Note  Patient: Virginia [name]  Procedure(s) Performed: Procedure(s) (LRB): APPENDECTOMY LAPAROSCOPIC (N/A)  Anesthesia type: general  Patient location: PACU  Post pain: Pain level controlled  Post assessment: Patient's Cardiovascular Status Stable  Last Vitals:  Filed Vitals:   10/04/11 1036  BP: 104/69  Pulse: 61  Temp: 36.9 C  Resp: 16    Post vital signs: Reviewed and stable  Level of consciousness: sedated  Complications: No apparent anesthesia complications

## 2011-10-04 NOTE — Progress Notes (Signed)
Patient ID: Virginia [name], female   DOB: 01-23-1951, 61 y.o.   MRN: [id number]  General Surgery - [facility] - Progress Note  POD# 1  Subjective: Patient complains of distension.  No nausea.  Limited ambulation.  Objective: Vital signs in last 24 hours: Temp:  [97.3 F (36.3 C)-98.2 F (36.8 C)] 98.2 F (36.8 C) (07/14 0542) Pulse Rate:  [52-73] 58  (07/14 0542) Resp:  [13-28] 16  (07/14 0542) BP: (90-147)/(56-95) 90/56 mmHg (07/14 0542) SpO2:  [90 %-100 %] 98 % (07/14 0542) Weight:  [139 lb 12.4 oz (63.4 kg)] 139 lb 12.4 oz (63.4 kg) (07/13 2015)    Intake/Output from previous day: 07/13 0701 - 07/14 0700 In: 1560 [P.O.:60; I.V.:1500] Out: 450 [Urine:400; Blood:50]  Exam: HEENT - clear, not icteric Neck - soft Chest - clear bilaterally Cor - RRR, no murmur Abd - moderate distension; few BS; wounds clear and dry Ext - no significant edema Neuro - grossly intact, no focal deficits  Lab Results:   Basename 10/03/11 1205  WBC 10.9*  HGB 13.7  HCT 39.8  PLT 248     Basename 10/03/11 1205  NA 138  K 3.6  CL 101  CO2 24  GLUCOSE 116*  BUN 8  CREATININE 0.55  CALCIUM 10.1    Studies/Results: Ct Abdomen Pelvis W Contrast  10/03/2011  *RADIOLOGY REPORT*  Clinical Data: Abdominal pain.  Nausea and vomiting.  Diarrhea.  CT ABDOMEN AND PELVIS WITH CONTRAST  Technique:  Multidetector CT imaging of the abdomen and pelvis was performed following the standard protocol during bolus administration of intravenous contrast.  Contrast: OMNIPAQUE IOHEXOL 300 MG/ML  SOLN  Comparison: None.  Findings: Mild periportal edema is seen, however no liver masses are identified.  No evidence of ascites or splenomegaly.  Mild gallbladder wall thickening is seen likely related to periportal edema.  There is no evidence of gallbladder dilatation or pericholecystic inflammatory changes.  The pancreas, adrenal glands, and kidneys are normal in appearance. No evidence of  hydronephrosis.  No soft tissue masses or lymphadenopathy identified within the abdomen or pelvis.  Uterus and adnexa are unremarkable in appearance.  Small amount of free fluid is noted in the pelvic cul-de-sac, which is nonspecific.  The appendix is enlarged and contains multiple appendicoliths.  The appendix measures up to 14 mm in diameter although there is no significant inflammatory changes seen within the periappendiceal fat.  No other inflammatory process or abscess identified.  No evidence of bowel wall thickening or dilatation.  IMPRESSION:  1. Enlarged appendix with multiple appendicoliths, but no significant periappendiceal inflammatory change. Differential diagnosis includes early acute appendicitis and mucocele. Recommend correlation with clinical exam and surgical consultation. 2.  Mild nonspecific periportal edema and small amount of pelvic ascites.  This may be seen with hepatocellular disease, and correlation with liver function test is recommended.  Original Report Authenticated By: Danae Orleans, M.D.    Assessment / Plan: 1.  Status post lap appendectomy  - advance diet  - encourage ambulation - limited mobility - walks with a cane  - likely home on Monday 7/15  Velora Heckler, MD, [facility] Office: [phone]  10/04/2011

## 2011-10-05 MED ORDER — OXYCODONE-ACETAMINOPHEN 5-325 MG PO TABS: 1.0000 | ORAL_TABLET | ORAL | Status: AC | PRN

## 2011-10-05 NOTE — Progress Notes (Signed)
Discharge instructions/Med Rec Sheet reviewed w/ pt. Pt expressed understanding and copies given w/ prescriptions. Pt d/c'd in stable condition via w/c, accompanied by discharge volunteers 

## 2011-10-05 NOTE — Progress Notes (Signed)
Clinical Social Work Department BRIEF PSYCHOSOCIAL ASSESSMENT 10/05/2011  Patient:  Virginia [name], Virginia [name]     Account Number:  [id number]     Admit date:  10/03/2011  Clinical Social Worker:  Dennison Bulla  Date/Time:  10/05/2011 10:45 AM  Referred by:  RN  Date Referred:  10/05/2011 Referred for  Other - See comment   Other Referral:   Medicaid   Interview type:  Patient Other interview type:   Husband involved    PSYCHOSOCIAL DATA Living Status:  FAMILY Admitted from facility:   Level of care:   Primary support name:  Onalee Hua Primary support relationship to patient:  SPOUSE Degree of support available:   Strong    CURRENT CONCERNS Current Concerns  Other - See comment   Other Concerns:   Medicaid    SOCIAL WORK ASSESSMENT / PLAN CSW received referral from RN reporting that patient had questions regarding Medicaid. CSW reviewed chart and met with patient at bedside. Husband was present and patient was agreeable to him being involved in assessment.    CSW introduced myself and explained role. CSW had previously contacted financial counselor who reported that patient had not made the $10,000 cut off for assistance through their department and asked CSW to complete referral. CSW provided patient with information regarding applying for Medicaid at [employer] ([facility]). CSW also provided patient with information regarding "information on paying your bill" in case she was not eligible for Medicaid. Patient reported that she had previously applied and only needed a stay at the hospital in order to become eligible. Patient reports no further needs at this time. CSW is signing off.   Assessment/plan status:  No Further Intervention Required Other assessment/ plan:   Information/referral to community resources:   DSS and flyer on paying bill    PATIENT'S/FAMILY'S RESPONSE TO PLAN OF CARE: Patient was alert and oriented. Patient and husband were engaged throughout  assessment and appreciative of CSW consult.

## 2011-10-05 NOTE — Discharge Summary (Signed)
Agree with above, passing flatus, tol diet, expected pain postop, will dc home with followup

## 2011-10-05 NOTE — Discharge Summary (Signed)
  Physician Discharge Summary  Patient ID: Virginia [name] MRN: [id number] DOB/AGE: 1950/12/29 61 y.o.  Admit date: 10/03/2011 Discharge date: 10/05/2011  Admitting Diagnosis: Acute appendicitis  Discharge Diagnosis Acute appendicitis  Consultants NONE  Procedures Laparoscopic appendectomy  Hospital Course: 61 yr old female admitted with abdominal pain and emesis.  Work up showed appendicitis.  Admitted, started on IV antibiotics and taken to the OR and underwent procedure listed above.  Tolerated this well with no apparent intraoperative complications.  Post-operative the patient was slow to mobilize due to pre exsisting problems with this therefore required an extra day in the hospital.  On post-op day #2, she was tolerating diet, ambulating at baseline, pain controlled, vitals good, and incisions c/d/i.  She was felt stable for discharge home.    Medication List  As of 10/05/2011  9:55 AM   TAKE these medications         ALFALFA PO   Take 1 tablet by mouth daily.      BION TEARS OP   Apply 1 drop to eye 3 (three) times daily as needed. Dry eyes      CALCIUM PO   Take 1 tablet by mouth daily.      Digestive Enzymes Caps   Take 1 capsule by mouth 3 (three) times daily with meals.      Estriol Micronized Powd   Place 1 application vaginally at bedtime. Estriol hrt 0.3mg /gm cream      Iodine (Kelp) 0.15 MG Tabs   Take 1 tablet by mouth daily.      MAGNESIUM PO   Take 1 tablet by mouth daily.      multivitamin with minerals Tabs   Take 1 tablet by mouth daily.      oxyCODONE-acetaminophen 5-325 MG per tablet   Commonly known as: PERCOCET   Take 1-2 tablets by mouth every 4 (four) hours as needed.      oxyCODONE-acetaminophen 5-325 MG per tablet   Commonly known as: PERCOCET   Take 0.5 tablets by mouth every 6 (six) hours as needed. pain      PROBIOTIC DAILY PO   Take 1 capsule by mouth daily.      PROGESTERONE EX   Apply topically.      ADRENAL PO   Take 1  tablet by mouth daily.      Silicone Liqd   Take 1 capsule by mouth 2 (two) times daily.      ST JOHNS WORT PO   Take 1 capsule by mouth daily.      thyroid 120 MG tablet   Commonly known as: ARMOUR   Take 120 mg by mouth daily.      VITAMIN D (CHOLECALCIFEROL) PO   Take 1 tablet by mouth daily.      zolpidem 10 MG tablet   Commonly known as: AMBIEN   Take 5 mg by mouth at bedtime. sleep             Follow-up Information    Follow up with [facility], MD. Schedule an appointment as soon as possible for a visit in 2 weeks. (Please call our office to schedule your follow up)    Contact information:   [facility] [address] [place] [address] [phone]          Signed: Denny [facility] [phone]  10/05/2011, 9:55 AM

## 2011-10-05 NOTE — Discharge Instructions (Signed)
[facility] °LAPAROSCOPIC SURGERY: POST OP INSTRUCTIONS °Always review your discharge instruction sheet given to you by the facility where your surgery was performed. °IF YOU HAVE DISABILITY OR FAMILY LEAVE FORMS, YOU MUST BRING THEM TO THE OFFICE FOR PROCESSING.   °DO NOT GIVE THEM TO YOUR DOCTOR. ° °1. A prescription for pain medication may be given to you upon discharge.  Take your pain medication as prescribed, if needed.  If narcotic pain medicine is not needed, then you may take acetaminophen (Tylenol) or ibuprofen (Advil) as needed. °2. Take your usually prescribed medications unless otherwise directed. °3. If you need a refill on your pain medication, please contact your pharmacy.  They will contact our office to request authorization. Prescriptions will not be filled after 5pm or on week-ends. °4. You should follow a light diet the first few days after arrival home, such as soup and crackers, etc.  Be sure to include lots of fluids daily. °5. Most patients will experience some swelling and bruising in the area of the incisions.  Ice packs will help.  Swelling and bruising can take several days to resolve.  °6. It is common to experience some constipation if taking pain medication after surgery.  Increasing fluid intake and taking a stool softener (such as Colace) will usually help or prevent this problem from occurring.  A mild laxative (Milk of Magnesia or Miralax) should be taken according to package instructions if there are no bowel movements after 48 hours. °7. Unless discharge instructions indicate otherwise, you may remove your bandages 24-48 hours after surgery, and you may shower at that time.  You may have steri-strips (small skin tapes) in place directly over the incision.  These strips should be left on the skin for 7-10 days.  If your surgeon used skin glue on the incision, you may shower in 24 hours.  The glue will flake off over the next 2-3 weeks.  Any sutures or  staples will be removed at the office during your follow-up visit. °8. ACTIVITIES:  You may resume regular (light) daily activities beginning the next day--such as daily self-care, walking, climbing stairs--gradually increasing activities as tolerated.  You may have sexual intercourse when it is comfortable.  Refrain from any heavy lifting or straining until approved by your doctor. °a. You may drive when you are no longer taking prescription pain medication, you can comfortably wear a seatbelt, and you can safely maneuver your car and apply brakes. °9. You should see your doctor in the office for a follow-up appointment approximately 2-3 weeks after your surgery.  Make sure that you call for this appointment within a day or two after you arrive home to insure a convenient appointment time. °10. OTHER INSTRUCTIONS:  °WHEN TO CALL YOUR DOCTOR: °1. Fever over 101.0 °2. Inability to urinate °3. Continued bleeding from incision. °4. Increased pain, redness, or drainage from the incision. °5. Increasing abdominal pain ° °The clinic staff is available to answer your questions during regular business hours.  Please don’t hesitate to call and ask to speak to one of the nurses for clinical concerns.  If you have a medical emergency, go to the nearest emergency room or call 911.  A surgeon from [facility] is always on call at the hospital. °[address], [address], [place], [place]  [address] ? [address] [address], [place], [place]   [address] °[phone] ? [phone] ? FAX [phone] °Web site: [email or web address] ° °

## 2011-10-06 ENCOUNTER — Encounter (HOSPITAL_COMMUNITY): Payer: Self-pay | Admitting: General Surgery

## 2011-10-14 ENCOUNTER — Encounter (INDEPENDENT_AMBULATORY_CARE_PROVIDER_SITE_OTHER): Payer: Self-pay | Admitting: General Surgery

## 2011-10-14 ENCOUNTER — Ambulatory Visit (INDEPENDENT_AMBULATORY_CARE_PROVIDER_SITE_OTHER): Payer: Self-pay | Admitting: General Surgery

## 2011-10-14 VITALS — BP 118/68 | HR 66 | Temp 96.9°F | Resp 14 | Ht 66.5 in | Wt 128.5 lb

## 2011-10-14 DIAGNOSIS — Z9889 Other specified postprocedural states: Secondary | ICD-10-CM

## 2011-10-14 DIAGNOSIS — Z9049 Acquired absence of other specified parts of digestive tract: Secondary | ICD-10-CM | POA: Insufficient documentation

## 2011-10-14 NOTE — Progress Notes (Signed)
Subjective:     Patient ID: Virginia [name], female   DOB: April 02, 1950, 61 y.o.   MRN: [id number]  HPI Patient is status post laparoscopic appendectomy. She is feeling very well. Bowel movements have been regular. No significant pain.  Review of Systems     Objective:   Physical Exam Abdomen is soft and nontender. All 3 incisions are healing well without signs of infection. No masses.    Assessment:     Doing well status post laparoscopic appendectomy.    Plan:       Avoid heavy lifting for 2 weeks after surgery. Patient is going on Medicaid to see him. She has a list of positions except Medicaid in town. I gave her some recommendations at her request. I will see her back as needed.

## 2011-11-26 ENCOUNTER — Other Ambulatory Visit: Payer: Self-pay | Admitting: Internal Medicine

## 2011-11-26 DIAGNOSIS — R160 Hepatomegaly, not elsewhere classified: Secondary | ICD-10-CM

## 2011-11-27 ENCOUNTER — Ambulatory Visit
Admission: RE | Admit: 2011-11-27 | Discharge: 2011-11-27 | Disposition: A | Discharge status: Home / Self Care | Payer: No Typology Code available for payment source | Source: Ambulatory Visit | Attending: Internal Medicine | Admitting: Internal Medicine

## 2011-11-27 DIAGNOSIS — R160 Hepatomegaly, not elsewhere classified: Secondary | ICD-10-CM

## 2012-01-13 ENCOUNTER — Other Ambulatory Visit: Payer: Self-pay | Admitting: Neurological Surgery

## 2012-01-13 DIAGNOSIS — M545 Low back pain, unspecified: Secondary | ICD-10-CM

## 2012-01-14 ENCOUNTER — Ambulatory Visit
Admission: RE | Admit: 2012-01-14 | Discharge: 2012-01-14 | Disposition: A | Discharge status: Home / Self Care | Payer: Medicaid Other | Source: Ambulatory Visit | Attending: Neurological Surgery | Admitting: Neurological Surgery

## 2012-01-14 DIAGNOSIS — M545 Low back pain, unspecified: Secondary | ICD-10-CM

## 2012-02-09 ENCOUNTER — Other Ambulatory Visit: Payer: Self-pay | Admitting: Neurological Surgery

## 2012-02-09 ENCOUNTER — Encounter (HOSPITAL_COMMUNITY)
Admission: RE | Admit: 2012-02-09 | Discharge: 2012-02-09 | Disposition: A | Discharge status: Home / Self Care | Payer: Medicaid Other | Source: Ambulatory Visit | Attending: Neurological Surgery | Admitting: Neurological Surgery

## 2012-02-09 ENCOUNTER — Encounter (HOSPITAL_COMMUNITY): Payer: Self-pay

## 2012-02-09 ENCOUNTER — Encounter (HOSPITAL_COMMUNITY): Payer: Self-pay | Admitting: Pharmacy Technician

## 2012-02-09 HISTORY — DX: Hypothyroidism, unspecified: E03.9

## 2012-02-09 HISTORY — DX: Cardiac arrhythmia, unspecified: I49.9

## 2012-02-09 HISTORY — DX: Unspecified convulsions: R56.9

## 2012-02-09 HISTORY — DX: Unspecified osteoarthritis, unspecified site: M19.90

## 2012-02-09 HISTORY — DX: Gastro-esophageal reflux disease without esophagitis: K21.9

## 2012-02-09 LAB — CBC
HCT: 40.5 % (ref 36.0–46.0)
MCH: 30.4 pg (ref 26.0–34.0)
MCHC: 33.8 g/dL (ref 30.0–36.0)
MCV: 90 fL (ref 78.0–100.0)
RDW: 12.3 % (ref 11.5–15.5)

## 2012-02-09 LAB — SURGICAL PCR SCREEN
MRSA, PCR: NEGATIVE
Staphylococcus aureus: NEGATIVE

## 2012-02-09 NOTE — Progress Notes (Addendum)
Mrs Enoch states that she occasional has an irregular heart beat and that she saw a cardiologist many years ago-"no idea who she saw or when."  I  spoke to [facility] they did not have any records , pt has just been seen there this year. [facility] will fax office notes for visits this year.  Pt has seen Dr Alessandra Bevels in the past, I faxed a request for any EKG to compare with the one done today.  Pt denies any chest pain or shortness or breath.   I left chart for Revonda Standard to view EKG.

## 2012-02-09 NOTE — Pre-Procedure Instructions (Signed)
21 Virginia [name]  02/09/2012   Your procedure is scheduled on: Thursday, November  21st.  Report to [facility] at :11:40 AM.  Call this number if you have problems the morning of surgery: [phone]   Remember                               Nothing to eat or drink after Midnight.      Take these medicines the morning of surgery with A SIP OF WATER: Thyroid (Armour).   May take Oxycodone-Acetaminophen (Percocet) if needed.    Do not wear jewelry, make-up or nail polish.  Do not wear lotions, powders, or perfumes. You may wear deodorant.  Do not shave 48 hours prior to surgery. Men may shave face and neck.  Do not bring valuables to the hospital.  Contacts, dentures or bridgework may not be worn into surgery.  Leave suitcase in the car. After surgery it may be brought to your room.  For patients admitted to the hospital, checkout time is 11:00 AM the day of discharge.   Patients discharged the day of surgery will not be allowed to drive home.  Name and phone number of your driver: NA   Special Instructions: Shower using CHG 2 nights before surgery and the night before surgery.  If you shower the day of surgery use CHG.  Use special wash - you have one bottle of CHG for all showers.  You should use approximately 1/3 of the bottle for each shower.   Please read over the following fact sheets that you were given: Pain Booklet, Coughing and Deep Breathing, Blood Transfusion Information and Surgical Site Infection Prevention

## 2012-02-10 MED ORDER — CEFAZOLIN SODIUM-DEXTROSE 2-3 GM-% IV SOLR
2.0000 g | INTRAVENOUS | Status: AC
  Administered 2012-02-11: 2 g via INTRAVENOUS
  Filled 2012-02-10: qty 50

## 2012-02-10 NOTE — Consult Note (Signed)
Anesthesia chart review: Patient is a 61 year old female posted for a 1 level posterior lumbar fusion by Dr. Yetta Barre on 02/11/2012. She is status post appendectomy on 10/03/2011. 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  PCP is Marletta Lor, ANP at [facility].  She has also seen Dr. Judie Petit in the past.   Labs done per anesthesia guidelines.  CBC WNL.  T&S done.  EKG on 02/09/12 showed sinus rhythm with first-degree AV block, cannot rule out anterior septal infarct, age undetermined. It was not felt significantly changed from her prior EKG on 10/03/2011.  She tolerated recent appendectomy, EKG is stable, and denied any CV symptoms at her PAT visit.  She will be evaluated by her assigned anesthesiologist on the day of surgery, but if no significant change in her status then anticipate she can proceed as planned.  Shonna Chock, PA-C

## 2012-02-11 ENCOUNTER — Inpatient Hospital Stay (HOSPITAL_COMMUNITY)
Admission: RE | Admit: 2012-02-11 | Discharge: 2012-02-13 | DRG: 460 | Disposition: A | Discharge status: Home / Self Care | Payer: Medicaid Other | Source: Ambulatory Visit | Attending: Neurological Surgery | Admitting: Neurological Surgery

## 2012-02-11 ENCOUNTER — Inpatient Hospital Stay (HOSPITAL_COMMUNITY): Payer: Medicaid Other

## 2012-02-11 ENCOUNTER — Encounter (HOSPITAL_COMMUNITY): Payer: Self-pay | Admitting: Vascular Surgery

## 2012-02-11 ENCOUNTER — Inpatient Hospital Stay (HOSPITAL_COMMUNITY): Payer: Medicaid Other | Admitting: Vascular Surgery

## 2012-02-11 ENCOUNTER — Encounter (HOSPITAL_COMMUNITY)
Admission: RE | Disposition: A | Discharge status: Home / Self Care | Payer: Self-pay | Source: Ambulatory Visit | Attending: Neurological Surgery

## 2012-02-11 ENCOUNTER — Encounter (HOSPITAL_COMMUNITY): Payer: Self-pay | Admitting: Neurological Surgery

## 2012-02-11 ENCOUNTER — Encounter (HOSPITAL_COMMUNITY): Payer: Self-pay | Admitting: Certified Registered Nurse Anesthetist

## 2012-02-11 DIAGNOSIS — R569 Unspecified convulsions: Secondary | ICD-10-CM | POA: Diagnosis present

## 2012-02-11 DIAGNOSIS — K219 Gastro-esophageal reflux disease without esophagitis: Secondary | ICD-10-CM | POA: Diagnosis present

## 2012-02-11 DIAGNOSIS — Z888 Allergy status to other drugs, medicaments and biological substances status: Secondary | ICD-10-CM

## 2012-02-11 DIAGNOSIS — I499 Cardiac arrhythmia, unspecified: Secondary | ICD-10-CM | POA: Diagnosis present

## 2012-02-11 DIAGNOSIS — R51 Headache: Secondary | ICD-10-CM | POA: Diagnosis present

## 2012-02-11 DIAGNOSIS — M4310 Spondylolisthesis, site unspecified: Secondary | ICD-10-CM | POA: Diagnosis present

## 2012-02-11 DIAGNOSIS — M48061 Spinal stenosis, lumbar region without neurogenic claudication: Principal | ICD-10-CM | POA: Diagnosis present

## 2012-02-11 DIAGNOSIS — Z981 Arthrodesis status: Secondary | ICD-10-CM

## 2012-02-11 DIAGNOSIS — M129 Arthropathy, unspecified: Secondary | ICD-10-CM | POA: Diagnosis present

## 2012-02-11 DIAGNOSIS — E039 Hypothyroidism, unspecified: Secondary | ICD-10-CM | POA: Diagnosis present

## 2012-02-11 DIAGNOSIS — IMO0001 Reserved for inherently not codable concepts without codable children: Secondary | ICD-10-CM | POA: Diagnosis present

## 2012-02-11 DIAGNOSIS — Z9089 Acquired absence of other organs: Secondary | ICD-10-CM

## 2012-02-11 DIAGNOSIS — Z79899 Other long term (current) drug therapy: Secondary | ICD-10-CM

## 2012-02-11 DIAGNOSIS — K449 Diaphragmatic hernia without obstruction or gangrene: Secondary | ICD-10-CM | POA: Diagnosis present

## 2012-02-11 LAB — BASIC METABOLIC PANEL
BUN: 12 mg/dL (ref 6–23)
CO2: 25 mEq/L (ref 19–32)
Calcium: 9.8 mg/dL (ref 8.4–10.5)
GFR calc non Af Amer: >90 mL/min (ref >90)
Glucose, Bld: 95 mg/dL (ref 70–99)

## 2012-02-11 LAB — CBC WITH DIFFERENTIAL/PLATELET
Basophils Relative: 1 % (ref 0–1)
Eosinophils Absolute: 0.1 10*3/uL (ref 0.0–0.7)
Eosinophils Relative: 2 % (ref 0–5)
Hemoglobin: 13.2 g/dL (ref 12.0–15.0)
MCH: 30 pg (ref 26.0–34.0)
MCHC: 34.1 g/dL (ref 30.0–36.0)
MCV: 88 fL (ref 78.0–100.0)
Monocytes Relative: 11 % (ref 3–12)
Neutrophils Relative %: 44 % (ref 43–77)
Platelets: 230 10*3/uL (ref 150–400)

## 2012-02-11 LAB — PROTIME-INR: INR: 1.08 (ref 0.00–1.49)

## 2012-02-11 SURGERY — POSTERIOR LUMBAR FUSION 1 LEVEL
Anesthesia: General | Site: Back | Wound class: Clean

## 2012-02-11 MED ORDER — PROPOFOL 10 MG/ML IV BOLUS
INTRAVENOUS | Status: DC | PRN
  Administered 2012-02-11: 200 mg via INTRAVENOUS

## 2012-02-11 MED ORDER — THYROID 120 MG PO TABS
120.0000 mg | ORAL_TABLET | Freq: Every day | ORAL | Status: DC
  Administered 2012-02-11 – 2012-02-12 (×2): 120 mg via ORAL
  Filled 2012-02-11 (×3): qty 1

## 2012-02-11 MED ORDER — SODIUM CHLORIDE 0.9 % IJ SOLN
3.0000 mL | Freq: Two times a day (BID) | INTRAMUSCULAR | Status: DC
  Administered 2012-02-12 (×2): 3 mL via INTRAVENOUS

## 2012-02-11 MED ORDER — THROMBIN 20000 UNITS EX SOLR
CUTANEOUS | Status: DC | PRN
  Administered 2012-02-11: 15:00:00 via TOPICAL

## 2012-02-11 MED ORDER — OXYCODONE-ACETAMINOPHEN 5-325 MG PO TABS
1.0000 | ORAL_TABLET | ORAL | Status: DC | PRN
  Administered 2012-02-11 – 2012-02-13 (×3): 2 via ORAL
  Filled 2012-02-11 (×3): qty 2

## 2012-02-11 MED ORDER — ZOLPIDEM TARTRATE 5 MG PO TABS: 5.0000 mg | ORAL_TABLET | Freq: Every evening | ORAL | Status: DC | PRN

## 2012-02-11 MED ORDER — ACETAMINOPHEN 650 MG RE SUPP: 650.0000 mg | RECTAL | Status: DC | PRN

## 2012-02-11 MED ORDER — PANTOPRAZOLE SODIUM 40 MG PO TBEC
40.0000 mg | DELAYED_RELEASE_TABLET | Freq: Every day | ORAL | Status: DC
  Administered 2012-02-11 – 2012-02-12 (×2): 40 mg via ORAL
  Filled 2012-02-11 (×2): qty 1

## 2012-02-11 MED ORDER — POTASSIUM CHLORIDE IN NACL 20-0.9 MEQ/L-% IV SOLN
INTRAVENOUS | Status: DC
  Administered 2012-02-11: 22:00:00 via INTRAVENOUS
  Filled 2012-02-11 (×4): qty 1000

## 2012-02-11 MED ORDER — MENTHOL 3 MG MT LOZG: 1.0000 | LOZENGE | OROMUCOSAL | Status: DC | PRN

## 2012-02-11 MED ORDER — SODIUM CHLORIDE 0.9 % IR SOLN
Status: DC | PRN
  Administered 2012-02-11: 15:00:00

## 2012-02-11 MED ORDER — ADULT MULTIVITAMIN W/MINERALS CH
1.0000 | ORAL_TABLET | Freq: Every day | ORAL | Status: DC
  Administered 2012-02-11 – 2012-02-12 (×2): 1 via ORAL
  Filled 2012-02-11 (×3): qty 1

## 2012-02-11 MED ORDER — DEXAMETHASONE SODIUM PHOSPHATE 10 MG/ML IJ SOLN
10.0000 mg | INTRAMUSCULAR | Status: AC
  Administered 2012-02-11: 10 mg via INTRAVENOUS
  Filled 2012-02-11: qty 1

## 2012-02-11 MED ORDER — ROCURONIUM BROMIDE 100 MG/10ML IV SOLN
INTRAVENOUS | Status: DC | PRN
  Administered 2012-02-11: 25 mg via INTRAVENOUS

## 2012-02-11 MED ORDER — SODIUM CHLORIDE 0.9 % IJ SOLN: 3.0000 mL | INTRAMUSCULAR | Status: DC | PRN

## 2012-02-11 MED ORDER — ONDANSETRON HCL 4 MG/2ML IJ SOLN: 4.0000 mg | Freq: Once | INTRAMUSCULAR | Status: DC | PRN

## 2012-02-11 MED ORDER — MORPHINE SULFATE 2 MG/ML IJ SOLN
1.0000 mg | INTRAMUSCULAR | Status: DC | PRN
  Administered 2012-02-11 – 2012-02-12 (×3): 2 mg via INTRAVENOUS
  Filled 2012-02-11 (×4): qty 1

## 2012-02-11 MED ORDER — BUPIVACAINE HCL (PF) 0.25 % IJ SOLN
INTRAMUSCULAR | Status: DC | PRN
  Administered 2012-02-11: 4 mL

## 2012-02-11 MED ORDER — CEFAZOLIN SODIUM 1-5 GM-% IV SOLN
1.0000 g | Freq: Three times a day (TID) | INTRAVENOUS | Status: AC
  Administered 2012-02-11 – 2012-02-12 (×2): 1 g via INTRAVENOUS
  Filled 2012-02-11 (×2): qty 50

## 2012-02-11 MED ORDER — FENTANYL CITRATE 0.05 MG/ML IJ SOLN
INTRAMUSCULAR | Status: DC | PRN
  Administered 2012-02-11: 50 ug via INTRAVENOUS
  Administered 2012-02-11: 150 ug via INTRAVENOUS
  Administered 2012-02-11: 50 ug via INTRAVENOUS

## 2012-02-11 MED ORDER — 0.9 % SODIUM CHLORIDE (POUR BTL) OPTIME
TOPICAL | Status: DC | PRN
  Administered 2012-02-11: 1000 mL

## 2012-02-11 MED ORDER — ACETAMINOPHEN 10 MG/ML IV SOLN
INTRAVENOUS | Status: AC
  Administered 2012-02-11: 1000 mg via INTRAVENOUS
  Filled 2012-02-11: qty 100

## 2012-02-11 MED ORDER — ACETAMINOPHEN 10 MG/ML IV SOLN
1000.0000 mg | Freq: Four times a day (QID) | INTRAVENOUS | Status: AC
  Administered 2012-02-11 – 2012-02-12 (×2): 1000 mg via INTRAVENOUS
  Filled 2012-02-11 (×4): qty 100

## 2012-02-11 MED ORDER — SODIUM CHLORIDE 0.9 % IV SOLN: 250.0000 mL | INTRAVENOUS | Status: DC

## 2012-02-11 MED ORDER — LIDOCAINE HCL (CARDIAC) 20 MG/ML IV SOLN
INTRAVENOUS | Status: DC | PRN
  Administered 2012-02-11: 100 mg via INTRAVENOUS

## 2012-02-11 MED ORDER — BACITRACIN 50000 UNITS IM SOLR
INTRAMUSCULAR | Status: AC
  Filled 2012-02-11: qty 1

## 2012-02-11 MED ORDER — SODIUM CHLORIDE 0.9 % IV SOLN
INTRAVENOUS | Status: AC
  Filled 2012-02-11: qty 500

## 2012-02-11 MED ORDER — HYDROMORPHONE HCL PF 1 MG/ML IJ SOLN
INTRAMUSCULAR | Status: AC
  Filled 2012-02-11: qty 1

## 2012-02-11 MED ORDER — METHOCARBAMOL 500 MG PO TABS
500.0000 mg | ORAL_TABLET | Freq: Four times a day (QID) | ORAL | Status: DC | PRN
  Administered 2012-02-12 – 2012-02-13 (×2): 500 mg via ORAL
  Filled 2012-02-11 (×3): qty 1

## 2012-02-11 MED ORDER — HYDROMORPHONE HCL PF 1 MG/ML IJ SOLN
0.2500 mg | INTRAMUSCULAR | Status: DC | PRN
  Administered 2012-02-11 (×4): 0.5 mg via INTRAVENOUS

## 2012-02-11 MED ORDER — OMEPRAZOLE MAGNESIUM 20 MG PO TBEC
20.0000 mg | DELAYED_RELEASE_TABLET | Freq: Every day | ORAL | Status: DC
  Filled 2012-02-11: qty 1

## 2012-02-11 MED ORDER — EPHEDRINE SULFATE 50 MG/ML IJ SOLN
INTRAMUSCULAR | Status: DC | PRN
  Administered 2012-02-11: 2.5 mg via INTRAVENOUS
  Administered 2012-02-11 (×3): 5 mg via INTRAVENOUS
  Administered 2012-02-11: 2.5 mg via INTRAVENOUS
  Administered 2012-02-11: 5 mg via INTRAVENOUS

## 2012-02-11 MED ORDER — ONDANSETRON HCL 4 MG/2ML IJ SOLN
INTRAMUSCULAR | Status: DC | PRN
  Administered 2012-02-11: 4 mg via INTRAVENOUS

## 2012-02-11 MED ORDER — SENNA 8.6 MG PO TABS
1.0000 | ORAL_TABLET | Freq: Two times a day (BID) | ORAL | Status: DC
  Administered 2012-02-11 – 2012-02-12 (×3): 8.6 mg via ORAL
  Filled 2012-02-11 (×6): qty 1

## 2012-02-11 MED ORDER — LACTATED RINGERS IV SOLN
INTRAVENOUS | Status: DC | PRN
  Administered 2012-02-11 (×2): via INTRAVENOUS

## 2012-02-11 MED ORDER — DEXTROSE 5 % IV SOLN
500.0000 mg | Freq: Four times a day (QID) | INTRAVENOUS | Status: DC | PRN
  Administered 2012-02-11: 500 mg via INTRAVENOUS
  Filled 2012-02-11: qty 5

## 2012-02-11 MED ORDER — HYDROMORPHONE HCL PF 1 MG/ML IJ SOLN
0.2500 mg | INTRAMUSCULAR | Status: DC | PRN
Start: 2012-02-11 — End: 2012-02-11

## 2012-02-11 MED ORDER — PHENOL 1.4 % MT LIQD: 1.0000 | OROMUCOSAL | Status: DC | PRN

## 2012-02-11 MED ORDER — GLYCOPYRROLATE 0.2 MG/ML IJ SOLN
INTRAMUSCULAR | Status: DC | PRN
  Administered 2012-02-11: 0.2 mg via INTRAVENOUS

## 2012-02-11 MED ORDER — DEXAMETHASONE SODIUM PHOSPHATE 4 MG/ML IJ SOLN
4.0000 mg | Freq: Four times a day (QID) | INTRAMUSCULAR | Status: DC
  Administered 2012-02-12 (×2): 4 mg via INTRAVENOUS
  Filled 2012-02-11 (×5): qty 1

## 2012-02-11 MED ORDER — DEXAMETHASONE 4 MG PO TABS
4.0000 mg | ORAL_TABLET | Freq: Four times a day (QID) | ORAL | Status: DC
  Administered 2012-02-11 – 2012-02-13 (×5): 4 mg via ORAL
  Filled 2012-02-11 (×11): qty 1

## 2012-02-11 MED ORDER — ACETAMINOPHEN 325 MG PO TABS: 650.0000 mg | ORAL_TABLET | ORAL | Status: DC | PRN

## 2012-02-11 MED ORDER — NEOSTIGMINE METHYLSULFATE 1 MG/ML IJ SOLN
INTRAMUSCULAR | Status: DC | PRN
  Administered 2012-02-11: 1 mg via INTRAVENOUS

## 2012-02-11 MED ORDER — ARTIFICIAL TEARS OP OINT
TOPICAL_OINTMENT | OPHTHALMIC | Status: DC | PRN
  Administered 2012-02-11: 1 via OPHTHALMIC

## 2012-02-11 MED ORDER — ONDANSETRON HCL 4 MG/2ML IJ SOLN
4.0000 mg | INTRAMUSCULAR | Status: DC | PRN
  Administered 2012-02-11 – 2012-02-12 (×3): 4 mg via INTRAVENOUS
  Filled 2012-02-11 (×4): qty 2

## 2012-02-11 SURGICAL SUPPLY — 71 items
5.5x30 MAS PLIF Screw (Screw) ×2 IMPLANT
APL SKNCLS STERI-STRIP NONHPOA (GAUZE/BANDAGES/DRESSINGS)
BAG DECANTER FOR FLEXI CONT (MISCELLANEOUS) ×2 IMPLANT
BENZOIN TINCTURE PRP APPL 2/3 (GAUZE/BANDAGES/DRESSINGS) IMPLANT
BLADE SURG ROTATE 9660 (MISCELLANEOUS) IMPLANT
BONE MATRIX OSTEOCEL PLUS 5CC (Bone Implant) ×1 IMPLANT
BUR MATCHSTICK NEURO 3.0 LAGG (BURR) ×2 IMPLANT
CAGE COROENT MP 8X23 (Cage) ×4 IMPLANT
CANISTER SUCTION 2500CC (MISCELLANEOUS) ×2 IMPLANT
CLIP NEUROVISION LG (CLIP) ×2 IMPLANT
CLOTH BEACON ORANGE TIMEOUT ST (SAFETY) ×2 IMPLANT
CONT SPEC 4OZ CLIKSEAL STRL BL (MISCELLANEOUS) ×4 IMPLANT
COVER BACK TABLE 24X17X13 BIG (DRAPES) IMPLANT
COVER TABLE BACK 60X90 (DRAPES) ×2 IMPLANT
DRAPE C-ARM 42X72 X-RAY (DRAPES) ×4 IMPLANT
DRAPE C-ARMOR (DRAPES) ×1 IMPLANT
DRAPE LAPAROTOMY 100X72X124 (DRAPES) ×2 IMPLANT
DRAPE POUCH INSTRU U-SHP 10X18 (DRAPES) ×2 IMPLANT
DRAPE SURG 17X23 STRL (DRAPES) ×2 IMPLANT
DRESSING TELFA 8X3 (GAUZE/BANDAGES/DRESSINGS) ×2 IMPLANT
DRSG OPSITE 4X5.5 SM (GAUZE/BANDAGES/DRESSINGS) ×3 IMPLANT
DURAPREP 26ML APPLICATOR (WOUND CARE) ×2 IMPLANT
ELECT REM PT RETURN 9FT ADLT (ELECTROSURGICAL) ×2
ELECTRODE REM PT RTRN 9FT ADLT (ELECTROSURGICAL) ×1 IMPLANT
EVACUATOR 1/8 PVC DRAIN (DRAIN) ×2 IMPLANT
GAUZE SPONGE 4X4 16PLY XRAY LF (GAUZE/BANDAGES/DRESSINGS) IMPLANT
GLOVE BIO SURGEON STRL SZ8 (GLOVE) ×4 IMPLANT
GLOVE BIO SURGEON STRL SZ8.5 (GLOVE) ×2 IMPLANT
GLOVE BIOGEL PI IND STRL 7.5 (GLOVE) IMPLANT
GLOVE BIOGEL PI IND STRL 8 (GLOVE) ×1 IMPLANT
GLOVE BIOGEL PI IND STRL 8.5 (GLOVE) ×1 IMPLANT
GLOVE BIOGEL PI INDICATOR 7.5 (GLOVE) ×2
GLOVE BIOGEL PI INDICATOR 8 (GLOVE) ×1
GLOVE BIOGEL PI INDICATOR 8.5 (GLOVE) ×1
GLOVE ECLIPSE 7.5 STRL STRAW (GLOVE) ×5 IMPLANT
GLOVE OPTIFIT SS 8.0 STRL (GLOVE) ×2 IMPLANT
GLOVE SURG SS PI 8.0 STRL IVOR (GLOVE) ×2 IMPLANT
GOWN BRE IMP SLV AUR LG STRL (GOWN DISPOSABLE) ×1 IMPLANT
GOWN BRE IMP SLV AUR XL STRL (GOWN DISPOSABLE) ×5 IMPLANT
GOWN STRL REIN 2XL LVL4 (GOWN DISPOSABLE) ×1 IMPLANT
HEMOSTAT POWDER KIT SURGIFOAM (HEMOSTASIS) IMPLANT
KIT BASIN OR (CUSTOM PROCEDURE TRAY) ×2 IMPLANT
KIT NDL NVM5 EMG ELECT (KITS) IMPLANT
KIT NEEDLE NVM5 EMG ELECT (KITS) ×1 IMPLANT
KIT NEEDLE NVM5 EMG ELECTRODE (KITS) ×1
KIT ROOM TURNOVER OR (KITS) ×2 IMPLANT
MILL MEDIUM DISP (BLADE) ×2 IMPLANT
NDL HYPO 25X1 1.5 SAFETY (NEEDLE) ×1 IMPLANT
NEEDLE HYPO 25X1 1.5 SAFETY (NEEDLE) ×2 IMPLANT
NS IRRIG 1000ML POUR BTL (IV SOLUTION) ×2 IMPLANT
PACK LAMINECTOMY NEURO (CUSTOM PROCEDURE TRAY) ×2 IMPLANT
PAD ARMBOARD 7.5X6 YLW CONV (MISCELLANEOUS) ×8 IMPLANT
ROD 35MM (Rod) ×1 IMPLANT
ROD 5.5X40MM (Rod) ×2 IMPLANT
SCREW LOCK (Screw) ×8 IMPLANT
SCREW LOCK FXNS SPNE MAS PL (Screw) IMPLANT
SCREW SHANK 5.0X30MM (Screw) ×2 IMPLANT
SCREW TULIP 5.5 (Screw) ×2 IMPLANT
SPONGE LAP 4X18 X RAY DECT (DISPOSABLE) IMPLANT
SPONGE SURGIFOAM ABS GEL 100 (HEMOSTASIS) ×2 IMPLANT
STRIP CLOSURE SKIN 1/2X4 (GAUZE/BANDAGES/DRESSINGS) ×2 IMPLANT
SUT VIC AB 0 CT1 18XCR BRD8 (SUTURE) ×1 IMPLANT
SUT VIC AB 0 CT1 8-18 (SUTURE) ×2
SUT VIC AB 2-0 CP2 18 (SUTURE) ×2 IMPLANT
SUT VIC AB 3-0 SH 8-18 (SUTURE) ×4 IMPLANT
SYR 20ML ECCENTRIC (SYRINGE) ×2 IMPLANT
TOWEL OR 17X24 6PK STRL BLUE (TOWEL DISPOSABLE) ×2 IMPLANT
TOWEL OR 17X26 10 PK STRL BLUE (TOWEL DISPOSABLE) ×2 IMPLANT
TRAP SPECIMEN MUCOUS 40CC (MISCELLANEOUS) ×1 IMPLANT
TRAY FOLEY CATH 14FRSI W/METER (CATHETERS) ×2 IMPLANT
WATER STERILE IRR 1000ML POUR (IV SOLUTION) ×2 IMPLANT

## 2012-02-11 NOTE — H&P (Signed)
Subjective: Patient is a 61 y.o. female admitted for PLIF L3-4. Onset of symptoms was a few years ago, gradually worsening since that time.  The pain is rated severe, and is located at the across the lower back and radiates to lower extremities. The pain is described as aching and stabbing and occurs intermittently. The symptoms have been progressive. Symptoms are exacerbated by exercise and standing. MRI or CT showed spondylolisthesis with stenosis L3-4.   Past Medical History  Diagnosis Date  . Back pain with radiation   . Low back pain with sciatica   . Palpitations   . Generalized headaches   . Lymph nodes enlarged   . Nasal congestion   . Sore throat   . Cough   . Abdominal pain   . Abdominal distension   . GERD (gastroesophageal reflux disease)   . Dysrhythmia   . Hypothyroidism   . Seizures     Phergan caused it  . Arthritis   . Fibromyalgia     "no longer"    Past Surgical History  Procedure Date  . Laparoscopic appendectomy 10/03/2011    Procedure: APPENDECTOMY LAPAROSCOPIC;  Surgeon: Liz Malady, MD;  Location: [facility];  Service: General;  Laterality: N/A;  . Appendectomy 10/03/11  . Cesarean section 09/09/81  . Wisdom tooth extraction 1973 - approximate    Prior to Admission medications   Medication Sig Start Date End Date Taking? Authorizing Provider  Artificial Tear Solution (BION TEARS OP) Apply 1 drop to eye 3 (three) times daily as needed. Dry eyes   Yes Historical Provider, MD  Ascorbic Acid (VITAMIN C) POWD Take 2.5 g by mouth 2 (two) times daily.   Yes Historical Provider, MD  Estriol Micronized POWD Place 0.3 g vaginally at bedtime. Estriol HRT 0.3mg /gm cream.   Yes Historical Provider, MD  omeprazole (PRILOSEC OTC) 20 MG tablet Take 20 mg by mouth daily.   Yes Historical Provider, MD  OVER THE COUNTER MEDICATION Apply 1 application topically every morning. Source Naturals Progesterone Cream.   Yes Historical Provider, MD  sodium chloride (OCEAN) 0.65 %  nasal spray Place 1 spray into the nose daily as needed. For congestion.   Yes Historical Provider, MD  Sodium Chloride-Sodium Bicarb (SINUS WASH NETI POT NA) Place 2 packets into the nose 2 (two) times daily.    Yes Historical Provider, MD  thyroid (ARMOUR) 120 MG tablet Take 120 mg by mouth daily.   Yes Historical Provider, MD  zolpidem (AMBIEN) 10 MG tablet Take 5 mg by mouth at bedtime. For sleep.   Yes Historical Provider, MD  CALCIUM PO Take 1-2 tablets by mouth 2 (two) times daily. Take 1 tablet every morning and take 2 tablets every evening.    Historical Provider, MD  Cholecalciferol (VITAMIN D PO) Take 4 drops by mouth daily.    Historical Provider, MD  Coenzyme Q10 (CO Q 10) 100 MG CAPS Take 100 mg by mouth daily.    Historical Provider, MD  Digestive Enzymes CAPS Take 1 capsule by mouth 3 (three) times daily with meals.    Historical Provider, MD  MAGNESIUM PO Take 1 tablet by mouth daily.    Historical Provider, MD  Misc Natural Products (ADRENAL PO) Take 1 tablet by mouth daily.    Historical Provider, MD  Multiple Vitamin (MULTIVITAMIN WITH MINERALS) TABS Take 1 tablet by mouth daily.    Historical Provider, MD  Omega-3 Fatty Acids (FISH OIL PO) Take 20 mLs by mouth 2 (two) times daily.  Historical Provider, MD  OVER THE COUNTER MEDICATION Take 1 tablet by mouth daily. Pau d'Arco Tablets.    Historical Provider, MD  Probiotic Product (PROBIOTIC DAILY PO) Take 1 capsule by mouth daily.    Historical Provider, MD  Silicone LIQD Take 3 drops by mouth 2 (two) times daily.     Historical Provider, MD  ST JOHNS WORT PO Take 0.5 Applicatorfuls by mouth daily.     Historical Provider, MD  Zinc 50 MG CAPS Take 50 mg by mouth daily.    Historical Provider, MD   Allergies  Allergen Reactions  . Other Nausea And Vomiting and Other (See Comments)    EGGS, Mozzarella Cheese.    Spices  Such as Cumin,  Termeric cause severe GERD.  Marland Kitchen Phenergan (Promethazine) Other (See Comments)    Thinks  it was a seizure    History  Substance Use Topics  . Smoking status: Never Smoker   . Smokeless tobacco: Never Used  . Alcohol Use: No    Family History  Problem Relation Age of Onset  . Emphysema Father      Review of Systems  Positive ROS: neg  All other systems have been reviewed and were otherwise negative with the exception of those mentioned in the HPI and as above.  Objective: Vital signs in last 24 hours:    General Appearance: Alert, cooperative, no distress, appears stated age Head: Normocephalic, without obvious abnormality, atraumatic Eyes: PERRL, conjunctiva/corneas clear, EOM's intact      Neck: Supple Back: Symmetric, no curvature, ROM normal, no CVA tenderness Lungs:  respirations unlabored Heart: Regular rate and rhythm Abdomen: Soft, non-tender Extremities: Extremities normal, atraumatic, no cyanosis or edema Pulses: 2+ and symmetric all extremities Skin: Skin color, texture, turgor normal, no rashes or lesions  NEUROLOGIC:   Mental status: Alert and oriented x4,  no aphasia, good attention span, fund of knowledge, and memory Motor Exam - grossly normal Sensory Exam - grossly normal Reflexes: 1+ Coordination - grossly normal Gait - grossly normal Balance - grossly normal Cranial Nerves: I: smell Not tested  II: visual acuity  OS: nl    OD: nl  II: visual fields Full to confrontation  II: pupils Equal, round, reactive to light  III,VII: ptosis None  III,IV,VI: extraocular muscles  Full ROM  V: mastication Normal  V: facial light touch sensation  Normal  V,VII: corneal reflex  Present  VII: facial muscle function - upper  Normal  VII: facial muscle function - lower Normal  VIII: hearing Not tested  IX: soft palate elevation  Normal  IX,X: gag reflex Present  XI: trapezius strength  5/5  XI: sternocleidomastoid strength 5/5  XI: neck flexion strength  5/5  XII: tongue strength  Normal    Data Review Lab Results  Component Value Date    WBC 5.5 02/09/2012   HGB 13.7 02/09/2012   HCT 40.5 02/09/2012   MCV 90.0 02/09/2012   PLT 259 02/09/2012   Lab Results  Component Value Date   NA 138 10/03/2011   K 3.6 10/03/2011   CL 101 10/03/2011   CO2 24 10/03/2011   BUN 8 10/03/2011   CREATININE 0.55 10/03/2011   GLUCOSE 116* 10/03/2011   No results found for this basename: INR, PROTIME    Assessment/Plan: Patient admitted for PLIF L3-4 for spondylolisthesis with stenosis causing back and leg pain. Patient has failed conservative therapy.  I explained the condition and procedure to the patient and answered any questions.  Patient  wishes to proceed with procedure as planned. Understands risks/ benefits and typical outcomes of procedure.   Arshia Spellman S 02/11/2012 10:56 AM

## 2012-02-11 NOTE — Preoperative (Signed)
Beta Blockers   Reason not to administer Beta Blockers:Not Applicable 

## 2012-02-11 NOTE — Anesthesia Preprocedure Evaluation (Addendum)
Anesthesia Evaluation  Patient identified by MRN, date of birth, ID band Patient awake    Reviewed: Allergy & Precautions, H&P , NPO status , Patient's Chart, lab work & pertinent test results  Airway Mallampati: I TM Distance: >3 FB Neck ROM: full    Dental  (+) Teeth Intact   Pulmonary  breath sounds clear to auscultation        Cardiovascular + dysrhythmias Rhythm:regular Rate:Normal     Neuro/Psych  Headaches, Seizures -,   Neuromuscular disease    GI/Hepatic hiatal hernia, GERD-  Medicated,  Endo/Other  Hypothyroidism   Renal/GU      Musculoskeletal  (+) Fibromyalgia -  Abdominal   Peds  Hematology   Anesthesia Other Findings   Reproductive/Obstetrics                         Anesthesia Physical Anesthesia Plan  ASA: II  Anesthesia Plan: General   Post-op Pain Management:    Induction: Intravenous  Airway Management Planned: Oral ETT  Additional Equipment:   Intra-op Plan:   Post-operative Plan: Extubation in OR  Informed Consent: I have reviewed the patients History and Physical, chart, labs and discussed the procedure including the risks, benefits and alternatives for the proposed anesthesia with the patient or authorized representative who has indicated his/her understanding and acceptance.   Dental advisory given  Plan Discussed with: CRNA and Anesthesiologist  Anesthesia Plan Comments:         Anesthesia Quick Evaluation

## 2012-02-11 NOTE — Transfer of Care (Signed)
Immediate Anesthesia Transfer of Care Note  Patient: Virginia [name]  Procedure(s) Performed: Procedure(s) (LRB) with comments: POSTERIOR LUMBAR FUSION 1 LEVEL (N/A) - Posterior Lumbar Three-Four Interbody and Fusion  Patient Location: PACU  Anesthesia Type:General  Level of Consciousness: awake, alert  and oriented  Airway & Oxygen Therapy: Patient Spontanous Breathing and Patient connected to nasal cannula oxygen  Post-op Assessment: Report given to PACU RN, Post -op Vital signs reviewed and stable and Patient moving all extremities X 4  Post vital signs: Reviewed and stable  Complications: No apparent anesthesia complications

## 2012-02-11 NOTE — Op Note (Signed)
02/11/2012  4:55 PM  PATIENT:  Virginia [name]  61 y.o. female  PRE-OPERATIVE DIAGNOSIS:  Spondylolisthesis L3-4 with spinal stenosis, back and leg pain  POST-OPERATIVE DIAGNOSIS:  Same  PROCEDURE:   1. Decompressive Gill-type lumbar laminectomy L3-4 requiring more work than would be required of the typical PLIF procedure in order to adequately decompress the neural elements.  2. Posterior lumbar interbody fusion L3-4 using a PEEK interbody cages packed with morcellized allograft and autograft.  3. Posterior fixation L3-4 using Nuvasive cortical pedicle screws.    SURGEON:  Marikay Alar, MD  ASSISTANTS: Dr. Lovell Sheehan  ANESTHESIA:  General  EBL: 250 ml  Total I/O In: 1000 [I.V.:1000] Out: 350 [Urine:100; Blood:250]  BLOOD ADMINISTERED:none  DRAINS: Hemovac   INDICATION FOR PROCEDURE: This patient had long history of back and leg pain. MRI showed a spondylolisthesis at L3-4 with spinal stenosis. She tried medical management well over a year without relief. Recommended a decompression and instrumented fusion. Patient understood the risks, benefits, and alternatives and potential outcomes and wished to proceed.  PROCEDURE DETAILS:  The patient was brought to the operating room. After induction of generalized endotracheal anesthesia the patient was rolled into the prone position on chest rolls and all pressure points were padded. She was hooked to EMG monitoring which was used throughout the case. The patient's lumbar region was cleaned and then prepped with DuraPrep and draped in the usual sterile fashion. Anesthesia was injected and then a dorsal midline incision was made and carried down to the lumbosacral fascia. The fascia was opened and the paraspinous musculature was taken down in a subperiosteal fashion to expose L3-4. Intraoperative fluoroscopy confirmed my level, and then I used AP and lateral fluoroscopy to localize the pedicle screw entry zones at L3. I used the hand  drill and the 50 tap while I monitored with the EMG to drill an upper and outward direction into the L3 pedicles and then placed 50 by 30 mm cortical pedicle screws. I then turned my attention to the decompression and the spinous process was removed and complete lumbar laminectomies, hemi- facetectomies, and foraminotomies were performed at L3-4. The yellow ligament was removed to expose the underlying dura and nerve roots, and generous foraminotomies were performed to adequately decompress the neural elements. Once the decompression was complete, I turned my attention to the posterior lower lumbar interbody fusion. The epidural venous vasculature was coagulated and cut sharply. Disc space was incised and the initial discectomy was performed with pituitary rongeurs. The disc space was distracted with sequential distractors to a height of  9 mm. We then used a series of scrapers and shavers to prepare the endplates for fusion. The midline was prepared with Epstein curettes. Once the complete discectomy was finished, we packed an appropriate sized peek interbody cage (8 mm lordotic) with local autograft and morcellized allograft, gently retracted the nerve root, and tapped the cage into position at L3-4 bilaterally. The midline was packed with morselized autograft and allograft. We then turned our attention to the posterior fixation at L4. The pedicle screw entry zones were identified utilizing surface landmarks and AP and lateral fluoroscopy. We probed each pedicle with the pedicle probe and tapped each pedicle with the appropriate tap. We palpated with a ball probe to assure no break in the cortex. We then placed 50 by 30mm screws into the pedicles bilaterally at L4. We then placed lordotic rods into the multiaxial screw heads of the pedicle screws and locked these in position with the  locking caps and anti-torque device.  We then checked our construct with AP and lateral fluoroscopy. Irrigated with copious amounts  of bacitracin-containing saline solution. Placed a medium Hemovac drain through separate stab incision. Inspected the nerve roots once again to assure adequate decompression, lined to the dura with Gelfoam, and closed the muscle and the fascia with 0 Vicryl. Closed the subcutaneous tissues with 2-0 Vicryl and subcuticular tissues with 3-0 Vicryl. The skin was closed with benzoin and Steri-Strips. Dressing was then applied, the patient was awakened from general anesthesia and transported to the recovery room in stable condition. At the end of the procedure all sponge, needle and instrument counts were correct.   PLAN OF CARE: Admit to inpatient   PATIENT DISPOSITION:  PACU - hemodynamically stable.   Delay start of Pharmacological VTE agent (>24hrs) due to surgical blood loss or risk of bleeding:  yes

## 2012-02-11 NOTE — Anesthesia Procedure Notes (Signed)
Procedure Name: Intubation Date/Time: 02/11/2012 1:56 PM Performed by: Sharlene Dory E Pre-anesthesia Checklist: Patient identified, Emergency Drugs available, Suction available, Patient being monitored and Timeout performed Patient Re-evaluated:Patient Re-evaluated prior to inductionOxygen Delivery Method: Circle system utilized Preoxygenation: Pre-oxygenation with 100% oxygen Intubation Type: IV induction Ventilation: Mask ventilation without difficulty Laryngoscope Size: Mac and 3 Grade View: Grade I Tube type: Oral Tube size: 7.0 mm Number of attempts: 1 Airway Equipment and Method: Stylet Placement Confirmation: ETT inserted through vocal cords under direct vision,  positive ETCO2 and breath sounds checked- equal and bilateral Secured at: 21 cm Tube secured with: Tape Dental Injury: Teeth and Oropharynx as per pre-operative assessment

## 2012-02-11 NOTE — Anesthesia Postprocedure Evaluation (Signed)
  Anesthesia Post-op Note  Patient: Virginia [name]  Procedure(s) Performed: Procedure(s) (LRB) with comments: POSTERIOR LUMBAR FUSION 1 LEVEL (N/A) - Posterior Lumbar Three-Four Interbody and Fusion  Patient Location: PACU  Anesthesia Type:General  Level of Consciousness: awake, alert , oriented, sedated and patient cooperative  Airway and Oxygen Therapy: Patient Spontanous Breathing  Post-op Pain: mild  Post-op Assessment: Post-op Vital signs reviewed, Patient's Cardiovascular Status Stable, Respiratory Function Stable, Patent Airway, No signs of Nausea or vomiting and Pain level controlled  Post-op Vital Signs: stable  Complications: No apparent anesthesia complications

## 2012-02-12 NOTE — Progress Notes (Signed)
Patient ID: Virginia [name], female   DOB: [birth year], 61 y.o.   MRN: [id number] Patient looks quite good postoperatively. Has had some nausea and vomiting through the night. He is not having much pain in her back or in her legs. In fact she has no leg pain or numbness tingling or weakness. He is sitting up and she is trying some clear liquids now. We will mobilize her today. Good strength in lower extremities. Belly is soft.

## 2012-02-12 NOTE — Progress Notes (Signed)
Patient remain very nauseous during the beginning of the shift and vomited 4 times. Zofran did help the patient. Patient is on clear liquid diet for now.

## 2012-02-12 NOTE — Evaluation (Signed)
Occupational Therapy Evaluation Patient Details Name: Virginia [name] MRN: [id number] DOB: 02/01/1951 Today's Date: 02/12/2012 Time: 4098-1191 OT Time Calculation (min): 52 min  OT Assessment / Plan / Recommendation Clinical Impression  This 61 yo female s/p back fusion surgery presents to acute OT with problems below. Will benefit from acute OT without need for follow up.    OT Assessment  Patient needs continued OT Services    Follow Up Recommendations  No OT follow up    Barriers to Discharge None    Equipment Recommendations  3 in 1 bedside comode (question seat for shower)       Frequency  Min 3X/week    Precautions / Restrictions Precautions Precautions: Back Required Braces or Orthoses: Spinal Brace Spinal Brace: Applied in sitting position Restrictions Weight Bearing Restrictions: No   Pertinent Vitals/Pain 6/10 back and sharp pain at times right under her left shoulder blade (applied heat under shoulder blade)    ADL  Eating/Feeding: Simulated;Independent Where Assessed - Eating/Feeding: Chair Grooming: Simulated;Set up Where Assessed - Grooming: Unsupported sitting Upper Body Bathing: Simulated;Set up Where Assessed - Upper Body Bathing: Unsupported sitting Lower Body Bathing: Simulated;Moderate assistance Where Assessed - Lower Body Bathing: Supported sit to stand Upper Body Dressing: Simulated;Set up Where Assessed - Upper Body Dressing: Unsupported sitting Lower Body Dressing: Simulated;Moderate assistance Where Assessed - Lower Body Dressing: Supported sit to stand Toilet Transfer: Mining engineer Method: Sit to Barista:  (Bed to recliner) Toileting - Clothing Manipulation and Hygiene: Simulated;Modified independent (with minguard A standing) Where Assessed - Toileting Clothing Manipulation and Hygiene: Standing Equipment Used:  (None, only bed to recliner due to no brace at  present) Transfers/Ambulation Related to ADLs: Min guard A sit to stand and stand to sit with min A for bed to recliner next to bed. ADL Comments: Brace arrived at end of session, [employer] rep applied it in recliner. Back to bed and went over how to doff and it as well as how to align it up when putting it back on.    OT Diagnosis: Generalized weakness;Acute pain  OT Problem List: Decreased strength;Decreased activity tolerance;Impaired balance (sitting and/or standing);Decreased knowledge of precautions;Decreased knowledge of use of DME or AE;Pain OT Treatment Interventions: Self-care/ADL training;DME and/or AE instruction;Balance training;Patient/family education   OT Goals Acute Rehab OT Goals OT Goal Formulation: With patient Time For Goal Achievement: 02/19/12 Potential to Achieve Goals: Good ADL Goals Pt Will Perform Grooming: with modified independence;Unsupported;Standing at sink (2 tasks) ADL Goal: Grooming - Progress: Goal set today Pt Will Perform Lower Body Bathing: with modified independence;Unsupported;Standing at sink;Sitting at sink;with adaptive equipment ADL Goal: Lower Body Bathing - Progress: Goal set today Pt Will Perform Lower Body Dressing: with modified independence;Sit to stand from chair;Sit to stand from bed;Unsupported;with adaptive equipment ADL Goal: Lower Body Dressing - Progress: Goal set today Pt Will Transfer to Toilet: with modified independence;Ambulation;with DME;3-in-1 ADL Goal: Toilet Transfer - Progress: Goal set today Pt Will Perform Toileting - Clothing Manipulation: Independently;Standing ADL Goal: Toileting - Clothing Manipulation - Progress: Goal set today Pt Will Perform Toileting - Hygiene: with modified independence;Sit to stand from 3-in-1/toilet ADL Goal: Toileting - Hygiene - Progress: Goal set today Pt Will Perform Tub/Shower Transfer: Shower transfer;Ambulation;with DME;Shower seat with back ADL Goal: Web designer - Progress: Goal  set today Miscellaneous OT Goals Miscellaneous OT Goal #1: Pt will be able to state and follow 3/3 back precautions independently. OT Goal: Miscellaneous Goal #1 - Progress: Goal set  today Miscellaneous OT Goal #2: Pt will be able to get in/OOB with HOB flat and no rail at Mod I level. OT Goal: Miscellaneous Goal #2 - Progress: Goal set today Miscellaneous OT Goal #3: Pt will be able to don/doff brace independently. OT Goal: Miscellaneous Goal #3 - Progress: Goal set today  Visit Information  Last OT Received On: 02/12/12 Assistance Needed: +1    Subjective Data  Subjective: I feel a little woozy while sitting EOB and in recliner (pt's vitals taken and encouarged to do ankle pumps and deep breathing   Prior Functioning     Home Living Lives With: Spouse Available Help at Discharge: Family;Available PRN/intermittently Type of Home: House Home Access: Stairs to enter [employer] of Steps: 2 Entrance Stairs-Rails:  (wraught iron pole) Home Layout: One level Bathroom Shower/Tub: Forensic scientist: Standard Bathroom Accessibility: Yes How Accessible: Accessible via walker Home Adaptive Equipment: Straight cane Prior Function Level of Independence: Independent Able to Take Stairs?: Yes Driving: Yes Communication Communication: No difficulties Dominant Hand: Right            Cognition  Overall Cognitive Status: Appears within functional limits for tasks assessed/performed Arousal/Alertness: Awake/alert Orientation Level: Appears intact for tasks assessed Behavior During Session: [facility] for tasks performed    Extremity/Trunk Assessment Right Upper Extremity Assessment RUE ROM/Strength/Tone: Within functional levels Left Upper Extremity Assessment LUE ROM/Strength/Tone: Within functional levels     Mobility Bed Mobility Bed Mobility: Rolling Left;Left Sidelying to Sit;Sitting - Scoot to Edge of Bed;Sit to Sidelying Left Rolling Left:  5: Supervision;With rail Left Sidelying to Sit: 5: Supervision;HOB flat;With rails Sitting - Scoot to Edge of Bed: 6: Modified independent (Device/Increase time) Sit to Sidelying Left: 5: Supervision;HOB flat Transfers Transfers: Sit to Stand;Stand to Sit Sit to Stand: 4: Min guard;With upper extremity assist;From bed Stand to Sit: 4: Min guard;With upper extremity assist;With armrests;To chair/3-in-1 Details for Transfer Assistance: No VCs need for hand placement              End of Session OT - End of Session Equipment Utilized During Treatment:  (None, only bed to recliner) Activity Tolerance:  (limited by feeling woozy and pain under left shoulder blade) Patient left: in bed;with call bell/phone within reach;with family/visitor present;with bed alarm set (husband) Nurse Communication:  (How pt felt when while she was sitting up)       Evette Georges [phone] 02/12/2012, 11:36 AM

## 2012-02-12 NOTE — Progress Notes (Signed)
INITIAL ADULT NUTRITION ASSESSMENT Date: 02/12/2012   Time: 1:00 PM Reason for Assessment: MST  INTERVENTION: 1. Educated pt on frequent small meals high in calories/protein to prevent further weight loss.  2. Supplement diet as appropriate once advanced   DOCUMENTATION CODES Per approved criteria  -Not Applicable    ASSESSMENT: Female 61 y.o.  Dx: PLIF L3-4  Hx:  Past Medical History  Diagnosis Date  . Back pain with radiation   . Low back pain with sciatica   . Palpitations   . Generalized headaches   . Lymph nodes enlarged   . Nasal congestion   . Sore throat   . Cough   . Abdominal pain   . Abdominal distension   . GERD (gastroesophageal reflux disease)   . Dysrhythmia   . Hypothyroidism   . Seizures     Phergan caused it  . Arthritis   . Fibromyalgia     "no longer"   Past Surgical History  Procedure Date  . Laparoscopic appendectomy 10/03/2011    Procedure: APPENDECTOMY LAPAROSCOPIC;  Surgeon: Liz Malady, MD;  Location: [facility] OR;  Service: General;  Laterality: N/A;  . Appendectomy 10/03/11  . Cesarean section 09/09/81  . Wisdom tooth extraction 1973 - approximate   Related Meds:  Scheduled Meds:   . [COMPLETED] acetaminophen      . acetaminophen  1,000 mg Intravenous Q6H  . [EXPIRED] bacitracin      . [COMPLETED]  ceFAZolin (ANCEF) IV  1 g Intravenous Q8H  . [COMPLETED]  ceFAZolin (ANCEF) IV  2 g Intravenous On Call to OR  . [COMPLETED] dexamethasone  10 mg Intravenous To OR  . dexamethasone  4 mg Intravenous Q6H   Or  . dexamethasone  4 mg Oral Q6H  . [EXPIRED] HYDROmorphone      . [EXPIRED] HYDROmorphone      . multivitamin with minerals  1 tablet Oral Daily  . pantoprazole  40 mg Oral Daily  . senna  1 tablet Oral BID  . [EXPIRED] sodium chloride      . sodium chloride  3 mL Intravenous Q12H  . thyroid  120 mg Oral Daily  . [DISCONTINUED] omeprazole  20 mg Oral Daily   Continuous Infusions:   . sodium chloride    . 0.9 % NaCl with  KCl 20 mEq / L 75 mL/hr at 02/11/12 2200   PRN Meds:.acetaminophen, acetaminophen, menthol-cetylpyridinium, methocarbamol (ROBAXIN) IV, methocarbamol, morphine injection, ondansetron (ZOFRAN) IV, oxyCODONE-acetaminophen, phenol, sodium chloride, zolpidem, [DISCONTINUED] 0.9 % irrigation (POUR BTL), [DISCONTINUED] bacitracin irrigation, [DISCONTINUED] bupivacaine, [DISCONTINUED]  HYDROmorphone (DILAUDID) injection, [DISCONTINUED]  HYDROmorphone (DILAUDID) injection [DISCONTINUED] ondansetron (ZOFRAN) IV, [DISCONTINUED] ondansetron (ZOFRAN) IV, [DISCONTINUED] Surgifoam 100 with Thrombin 20,000 units (20 ml) topical solution  Ht: 5\' 6"  (167.6 cm)  Wt: 126 lb (57.153 kg)  Ideal Wt: 61.3 kg % Ideal Wt: 93%  Usual Wt:  Wt Readings from Last 10 Encounters:  02/12/12 126 lb (57.153 kg)  02/12/12 126 lb (57.153 kg)  02/09/12 125 lb 3.2 oz (56.79 kg)  10/14/11 128 lb 8 oz (58.287 kg)  10/03/11 139 lb 12.4 oz (63.4 kg)  10/03/11 139 lb 12.4 oz (63.4 kg)    % Usual Wt: 93%  Body mass index is 20.34 kg/(m^2).  Food/Nutrition Related Hx: decreased intake at home  Labs:  CMP     Component Value Date/Time   NA 139 02/11/2012 1200   K 3.7 02/11/2012 1200   CL 106 02/11/2012 1200   CO2 25 02/11/2012 1200  GLUCOSE 95 02/11/2012 1200   BUN 12 02/11/2012 1200   CREATININE 0.57 02/11/2012 1200   CALCIUM 9.8 02/11/2012 1200   PROT 7.3 10/03/2011 1205   ALBUMIN 4.1 10/03/2011 1205   AST 25 10/03/2011 1205   ALT 24 10/03/2011 1205   ALKPHOS 115 10/03/2011 1205   BILITOT 0.4 10/03/2011 1205   GFRNONAA >90 02/11/2012 1200   GFRAA >90 02/11/2012 1200    Intake/Output Summary (Last 24 hours) at 02/12/12 1307 Last data filed at 02/12/12 0630  Gross per 24 hour  Intake 2343.5 ml  Output    510 ml  Net 1833.5 ml   Last BM: 11/20  Diet Order: Clear Liquid  Supplements/Tube Feeding: none  IVF:    sodium chloride   0.9 % NaCl with KCl 20 mEq / L Last Rate: 75 mL/hr at 02/11/12 2200   Pt  reports that she has had 7% weight loss x 4 month. She states that weight loss has been due to surgery in 09/2011 and then having a hiatal hernia. Pt reports intolerance to large meals and sometimes has pain after eating. Reviewed 24 hr recall with pt who is eating a healthy, varied diet but smaller portions resulting in weight loss.   Estimated Nutritional Needs:   Kcal:  1500-1700 Protein:  70-80 grams  Fluid:  >1.5 L/day  NUTRITION DIAGNOSIS: Unintentional weight loss related to pain and nausea as evidenced by 7% weight loss x 4 months.   MONITORING/EVALUATION(Goals): Goal: Pt to meet >/= 90% of their estimated nutrition needs. Monitor: diet tolerance, PO intake, weight  EDUCATION NEEDS: -Education needs addressed   Kendell Bane RD, LDN, CNSC [phone] Pager [phone] After Hours Pager  02/12/2012, 1:00 PM

## 2012-02-12 NOTE — Evaluation (Signed)
Physical Therapy Evaluation Patient Details Name: Virginia [name] MRN: [id number] DOB: 10-Mar-1951 Today's Date: 02/12/2012 Time: 6440-3474 PT Time Calculation (min): 22 min  PT Assessment / Plan / Recommendation Clinical Impression  Pt s/p lumbar fusion.  Pt doing well with mobility and should be able to return home with husband.  Encouraged pt to amb with family or staff later this PM.    PT Assessment  Patient needs continued PT services    Follow Up Recommendations  No PT follow up    Does the patient have the potential to tolerate intense rehabilitation      Barriers to Discharge        Equipment Recommendations  3 in 1 bedside comode    Recommendations for Other Services     Frequency Min 5X/week    Precautions / Restrictions Precautions Precautions: Back Required Braces or Orthoses: Spinal Brace Spinal Brace: Applied in sitting position Restrictions Weight Bearing Restrictions: No   Pertinent Vitals/Pain Incisional pain.      Mobility  Bed Mobility Bed Mobility: Rolling Left;Left Sidelying to Sit;Sitting - Scoot to Edge of Bed;Sit to Sidelying Left Rolling Left: 5: Supervision;With rail Left Sidelying to Sit: 5: Supervision;With rails Sitting - Scoot to Edge of Bed: 6: Modified independent (Device/Increase time) Sit to Sidelying Left: 5: Supervision;With rail Transfers Sit to Stand: 5: Supervision;With upper extremity assist;From bed;From toilet Stand to Sit: 5: Supervision;With upper extremity assist;To bed;To chair/3-in-1 Details for Transfer Assistance: No VCs need for hand placement Ambulation/Gait Ambulation/Gait Assistance: 5: Supervision Ambulation Distance (Feet): 200 Feet Assistive device: Straight cane Gait Pattern: Step-through pattern;Decreased stride length    Shoulder Instructions     Exercises     PT Diagnosis: Difficulty walking;Acute pain  PT Problem List: Decreased mobility;Decreased activity tolerance PT Treatment  Interventions: DME instruction;Gait training;Stair training;Functional mobility training;Patient/family education;Therapeutic activities   PT Goals Acute Rehab PT Goals PT Goal Formulation: With patient Time For Goal Achievement: 02/19/12 Potential to Achieve Goals: Good Pt will go Supine/Side to Sit: with modified independence PT Goal: Supine/Side to Sit - Progress: Goal set today Pt will go Sit to Supine/Side: with modified independence PT Goal: Sit to Supine/Side - Progress: Goal set today Pt will go Sit to Stand: with modified independence PT Goal: Sit to Stand - Progress: Goal set today Pt will go Stand to Sit: with modified independence PT Goal: Stand to Sit - Progress: Goal set today Pt will Ambulate: >150 feet;with modified independence;with least restrictive assistive device PT Goal: Ambulate - Progress: Goal set today Pt will Go Up / Down Stairs: 1-2 stairs;with min assist PT Goal: Up/Down Stairs - Progress: Goal set today  Visit Information  Last PT Received On: 02/12/12 Assistance Needed: +1    Subjective Data  Subjective: Pt states she is feeling better. Patient Stated Goal: Return home   Prior Functioning  Home Living Lives With: Spouse Available Help at Discharge: Family;Available PRN/intermittently Type of Home: House Home Access: Stairs to enter [employer] of Steps: 2 Entrance Stairs-Rails:  (wraught iron pole) Home Layout: One level Bathroom Shower/Tub: Forensic scientist: Standard Bathroom Accessibility: Yes How Accessible: Accessible via walker Home Adaptive Equipment: Straight cane Prior Function Level of Independence: Independent Able to Take Stairs?: Yes Driving: Yes Communication Communication: No difficulties Dominant Hand: Right    Cognition  Overall Cognitive Status: Appears within functional limits for tasks assessed/performed Arousal/Alertness: Awake/alert Orientation Level: Appears intact for tasks  assessed Behavior During Session: [facility] for tasks performed    Extremity/Trunk Assessment  Right Upper Extremity Assessment RUE ROM/Strength/Tone: Within functional levels Left Upper Extremity Assessment LUE ROM/Strength/Tone: Within functional levels Right Lower Extremity Assessment RLE ROM/Strength/Tone: WFL for tasks assessed Left Lower Extremity Assessment LLE ROM/Strength/Tone: WFL for tasks assessed   Balance Static Standing Balance Static Standing - Balance Support: No upper extremity supported;During functional activity Static Standing - Level of Assistance: 5: Stand by assistance  End of Session PT - End of Session Activity Tolerance: Patient tolerated treatment well Patient left: in bed;with call bell/phone within reach Nurse Communication: Mobility status  GP     [facility] 02/12/2012, 2:26 PM  [employer] PT [phone]

## 2012-02-13 MED ORDER — METHOCARBAMOL 500 MG PO TABS: 500.0000 mg | ORAL_TABLET | Freq: Four times a day (QID) | ORAL | Status: DC | PRN

## 2012-02-13 MED ORDER — OXYCODONE-ACETAMINOPHEN 5-325 MG PO TABS: 1.0000 | ORAL_TABLET | Freq: Four times a day (QID) | ORAL | Status: DC | PRN

## 2012-02-13 NOTE — Discharge Summary (Signed)
Physician Discharge Summary  Patient ID: Virginia [name] MRN: [id number] DOB/AGE: 1950/04/01 61 y.o.  Admit date: 02/11/2012 Discharge date: 02/13/2012  Admission Diagnoses: lumbar stenosis/ spondylolisthesis    Discharge Diagnoses: same   Discharged Condition: good  Hospital Course: The patient was admitted on 02/11/2012 and taken to the operating room where the patient underwent PLIF L3-4. The patient tolerated the procedure well and was taken to the recovery room and then to the floor in stable condition. The hospital course was routine. There were no complications. The wound remained clean dry and intact. Pt had appropriate back soreness. No complaints of leg pain or new N/T/W. The patient remained afebrile with stable vital signs, and tolerated a regular diet. The patient continued to increase activities, and pain was well controlled with oral pain medications.   Consults: None  Significant Diagnostic Studies:  Results for orders placed during the hospital encounter of 02/11/12  BASIC METABOLIC PANEL      Component Value Range   Sodium 139  135 - 145 mEq/L   Potassium 3.7  3.5 - 5.1 mEq/L   Chloride 106  96 - 112 mEq/L   CO2 25  19 - 32 mEq/L   Glucose, Bld 95  70 - 99 mg/dL   BUN 12  6 - 23 mg/dL   Creatinine, Ser 4.09  0.50 - 1.10 mg/dL   Calcium 9.8  8.4 - 81.1 mg/dL   GFR calc non Af Amer >90  >90 mL/min   GFR calc Af Amer >90  >90 mL/min  CBC WITH DIFFERENTIAL      Component Value Range   WBC 3.7 (*) 4.0 - 10.5 K/uL   RBC 4.40  3.87 - 5.11 MIL/uL   Hemoglobin 13.2  12.0 - 15.0 g/dL   HCT 91.4  78.2 - 95.6 %   MCV 88.0  78.0 - 100.0 fL   MCH 30.0  26.0 - 34.0 pg   MCHC 34.1  30.0 - 36.0 g/dL   RDW 21.3  08.6 - 57.8 %   Platelets 230  150 - 400 K/uL   Neutrophils Relative 44  43 - 77 %   Neutro Abs 1.6 (*) 1.7 - 7.7 K/uL   Lymphocytes Relative 42  12 - 46 %   Lymphs Abs 1.6  0.7 - 4.0 K/uL   Monocytes Relative 11  3 - 12 %   Monocytes Absolute 0.4   0.1 - 1.0 K/uL   Eosinophils Relative 2  0 - 5 %   Eosinophils Absolute 0.1  0.0 - 0.7 K/uL   Basophils Relative 1  0 - 1 %   Basophils Absolute 0.0  0.0 - 0.1 K/uL  PROTIME-INR      Component Value Range   Prothrombin Time 13.9  11.6 - 15.2 seconds   INR 1.08  0.00 - 1.49    Chest 2 View  02/11/2012  *RADIOLOGY REPORT*  Clinical Data: Gastroesophageal reflux disease.  CHEST - 2 VIEW  Comparison: None.  Findings: Cardiomediastinal silhouette appears normal.  No acute pulmonary disease is noted.  Bony thorax is intact.  IMPRESSION: No acute cardiopulmonary abnormality seen.   Original Report Authenticated By: Lupita Raider.,  M.D.    Dg Lumbar Spine 2-3 Views  02/11/2012  *RADIOLOGY REPORT*  Clinical Data: Back pain  LUMBAR SPINE - 2-3 VIEW,DG C-ARM 61-120 MIN  Comparison: Multiple priors.  Findings: C-arm films document L3-4 PLIF.  No adverse features.  IMPRESSION: As above.   Original Report Authenticated By: Jonny Ruiz  Curnes, M.D.    Mr Lumbar Spine Wo Contrast  01/14/2012  *RADIOLOGY REPORT*  Clinical Data: Low back pain.  Left leg and hip pain and numbness  MRI LUMBAR SPINE WITHOUT CONTRAST  Technique:  Multiplanar and multiecho pulse sequences of the lumbar spine were obtained without intravenous contrast.  Comparison: Lumbar MRI 03/14/2010  Findings: Conus medullaris is normal and terminates at L1. Negative for lumbar fracture or mass lesion.  L1-2:  Negative  L2-3:  Right paracentral disc protrusion is slightly larger than on the prior study.  There is mild lateral recess stenosis on the right and mild facet degeneration.  L3-4:  Progressive disc degeneration and disc space narrowing. There remains 6 mm of anterior slip, as noted previously.  Diffuse disc protrusion has improved since the prior study.  There is moderate facet and ligamentum flavum hypertrophy.  There is moderate spinal stenosis which has improved since the prior study. No evidence of discectomy at this level.  Mild foraminal  narrowing bilaterally.  L4-5:  Disc degeneration with diffuse disc bulging and facet degeneration.  This is unchanged from the  prior study.  L5-S1:  Mild disc degeneration.  Small annular tear centrally without disc protrusion.  This is unchanged.  IMPRESSION: At L2-3, there is a right-sided disc protrusion which has increased slightly in the interval.  At L3-4 there is improvement in spinal stenosis and disc protrusion.  There remains moderate stenosis.  There is progressive disc degeneration at this level.   Original Report Authenticated By: Camelia Phenes, M.D.    Dg C-arm 61-120 Min  02/11/2012  *RADIOLOGY REPORT*  Clinical Data: Back pain  LUMBAR SPINE - 2-3 VIEW,DG C-ARM 61-120 MIN  Comparison: Multiple priors.  Findings: C-arm films document L3-4 PLIF.  No adverse features.  IMPRESSION: As above.   Original Report Authenticated By: Davonna Belling, M.D.     Antibiotics:  Anti-infectives     Start     Dose/Rate Route Frequency Ordered Stop   02/11/12 1945   ceFAZolin (ANCEF) IVPB 1 g/50 mL premix        1 g 100 mL/hr over 30 Minutes Intravenous Every 8 hours 02/11/12 1933 02/12/12 0424   02/11/12 1440   bacitracin 50,000 Units in sodium chloride irrigation 0.9 % 500 mL irrigation  Status:  Discontinued          As needed 02/11/12 1509 02/11/12 1653   02/11/12 1426   bacitracin 40981 UNITS injection  Status:  Discontinued     Comments: DAY, DORY: cabinet override         02/11/12 1426 02/11/12 1427   02/11/12 1331   bacitracin 19147 UNITS injection     Comments: DAY, DORY: cabinet override         02/11/12 1331 02/12/12 0144   02/11/12 0600   ceFAZolin (ANCEF) IVPB 2 g/50 mL premix        2 g 100 mL/hr over 30 Minutes Intravenous On call to O.R. 02/10/12 1452 02/11/12 1415          Discharge Exam: Blood pressure 91/55, pulse 66, temperature 98 F (36.7 C), temperature source Oral, resp. rate 16, height 5\' 6"  (1.676 m), weight 57.153 kg (126 lb), SpO2 99.00%. Neurologic: Grossly  normal Incision CDI  Discharge Medications:     Medication List     As of 02/13/2012  8:42 AM    TAKE these medications         ADRENAL PO   Take 1 tablet by mouth  daily.      BION TEARS OP   Apply 1 drop to eye 3 (three) times daily as needed. Dry eyes      CALCIUM PO   Take 1-2 tablets by mouth 2 (two) times daily. Take 1 tablet every morning and take 2 tablets every evening.      Co Q 10 100 MG Caps   Take 100 mg by mouth daily.      Digestive Enzymes Caps   Take 1 capsule by mouth 3 (three) times daily with meals.      Estriol Micronized Powd   Place 0.3 g vaginally at bedtime. Estriol HRT 0.3mg /gm cream.      FISH OIL PO   Take 20 mLs by mouth 2 (two) times daily.      MAGNESIUM PO   Take 1 tablet by mouth daily.      methocarbamol 500 MG tablet   Commonly known as: ROBAXIN   Take 1 tablet (500 mg total) by mouth every 6 (six) hours as needed.      multivitamin with minerals Tabs   Take 1 tablet by mouth daily.      omeprazole 20 MG tablet   Commonly known as: PRILOSEC OTC   Take 20 mg by mouth daily.      OVER THE COUNTER MEDICATION   Apply 1 application topically every morning. Source Naturals Progesterone Cream.      OVER THE COUNTER MEDICATION   Take 1 tablet by mouth daily. [place] Tablets.      oxyCODONE-acetaminophen 5-325 MG per tablet   Commonly known as: PERCOCET/ROXICET   Take 1-2 tablets by mouth every 6 (six) hours as needed for pain.      PROBIOTIC DAILY PO   Take 1 capsule by mouth daily.      Silicone Liqd   Take 3 drops by mouth 2 (two) times daily.      SINUS WASH NETI POT NA   Place 2 packets into the nose 2 (two) times daily.      sodium chloride 0.65 % nasal spray   Commonly known as: OCEAN   Place 1 spray into the nose daily as needed. For congestion.      ST JOHNS WORT PO   Take 0.5 Applicatorfuls by mouth daily.      thyroid 120 MG tablet   Commonly known as: ARMOUR   Take 120 mg by mouth daily.      Vitamin C  Powd   Take 2.5 g by mouth 2 (two) times daily.      VITAMIN D PO   Take 4 drops by mouth daily.      Zinc 50 MG Caps   Take 50 mg by mouth daily.      zolpidem 10 MG tablet   Commonly known as: AMBIEN   Take 5 mg by mouth at bedtime. For sleep.        Disposition: home  Final Dx: PLIF L3-4      Discharge Orders    Future Orders Please Complete By Expires   Diet - low sodium heart healthy      Increase activity slowly      No wound care      Call MD for:  temperature >100.4      Call MD for:  persistant nausea and vomiting      Call MD for:  severe uncontrolled pain      Call MD for:  redness, tenderness, or signs of  infection (pain, swelling, redness, odor or green/yellow discharge around incision site)      Call MD for:  difficulty breathing, headache or visual disturbances      Discharge instructions      Comments:   No bending, twisting, or lifting, take it easy, walk twice per day      Follow-up Information    Follow up with Stephaney Steven S, MD. Schedule an appointment as soon as possible for a visit in 2 weeks.   Contact information:   [address] [place] [address] [phone]           Signed: Tia Alert 02/13/2012, 8:42 AM

## 2012-02-13 NOTE — Progress Notes (Signed)
Physical Therapy Treatment Patient Details Name: Virginia [name] MRN: [id number] DOB: 02/01/1951 Today's Date: 02/13/2012 Time: 4098-1191 PT Time Calculation (min): 16 min  PT Assessment / Plan / Recommendation Comments on Treatment Session  Denies concerns with going home. Educated pt on using cane as she fatigues for increased support specifically when out in the community. Good knowledge of and adherence to 3/3 back precautions.     Follow Up Recommendations  No PT follow up     Does the patient have the potential to tolerate intense rehabilitation     Barriers to Discharge        Equipment Recommendations  None recommended by PT    Recommendations for Other Services    Frequency     Plan Discharge plan remains appropriate;Frequency remains appropriate    Precautions / Restrictions Precautions Precautions: Back Precaution Booklet Issued: Yes (comment) Required Braces or Orthoses: Spinal Brace Spinal Brace: Lumbar corset;Applied in sitting position Restrictions Weight Bearing Restrictions: No   Pertinent Vitals/Pain Reports very low  Pain, some tingling in her back and minimal in her feet    Mobility  Bed Mobility Bed Mobility: Sit to Sidelying Left;Rolling Right Rolling Right: 6: Modified independent (Device/Increase time) Sit to Sidelying Left: 6: Modified independent (Device/Increase time) Transfers Transfers: Stand to Sit Stand to Sit: 6: Modified independent (Device/Increase time);With upper extremity assist;To bed Details for Transfer Assistance: good tall posture Ambulation/Gait Ambulation/Gait Assistance: 5: Supervision Ambulation Distance (Feet): 300 Feet Assistive device: None Ambulation/Gait Assistance Details: cautious stiff posture, good avoidance of twisting noted; reinforced back precautions and possible use of straight cane in the community for support as she fatigues Gait Pattern: Narrow base of support Stairs: Yes Stairs Assistance: 4:  Min assist Stairs Assistance Details (indicate cue type and reason): hand held assist to ascend and descend steps with sight instability noted needing minA to correct; pt performed 2 steps x2 for practice Stair Management Technique: Forwards Number of Stairs: 4            PT Goals Acute Rehab PT Goals PT Goal: Sit to Supine/Side - Progress: Met PT Goal: Stand to Sit - Progress: Met PT Goal: Ambulate - Progress: Progressing toward goal PT Goal: Up/Down Stairs - Progress: Met  Visit Information  Last PT Received On: 02/13/12 Assistance Needed: +1    Subjective Data  Subjective: My pain is actually really low.    Cognition  Overall Cognitive Status: Appears within functional limits for tasks assessed/performed Arousal/Alertness: Awake/alert Orientation Level: Appears intact for tasks assessed Behavior During Session: [facility] for tasks performed    Balance     End of Session PT - End of Session Equipment Utilized During Treatment: Gait belt Activity Tolerance: Patient tolerated treatment well Patient left: in bed;with call bell/phone within reach Nurse Communication: Mobility status   GP     [name] 02/13/2012, 9:57 AM

## 2012-02-13 NOTE — Progress Notes (Signed)
Occupational Therapy Treatment Patient Details Name: Virginia [name] MRN: [id number] DOB: Dec 17, 1950 Today's Date: 02/13/2012 Time: 4098-1191 OT Time Calculation (min): 14 min  OT Assessment / Plan / Recommendation Comments on Treatment Session This 61 yo s/p back fusion surgery presents to acute OT with all education completed. No further needs, will sign off.    Follow Up Recommendations  No OT follow up       Equipment Recommendations  3 in 1 bedside comode;Tub/shower seat (tub shower seat with back)       Frequency Min 3X/week   Plan Discharge plan remains appropriate    Precautions / Restrictions Precautions Precautions: Back Precaution Booklet Issued: Yes (comment) Required Braces or Orthoses: Spinal Brace Spinal Brace: Lumbar corset;Applied in sitting position Restrictions Weight Bearing Restrictions: No   Pertinent Vitals/Pain 1/10 back    ADL  Lower Body Bathing: Simulated;Modified independent (Can cross one leg over the other to get to feet) Where Assessed - Lower Body Bathing: Unsupported sitting Lower Body Dressing: Simulated (Can cross one leg over the other to get to feet) Where Assessed - Lower Body Dressing: Unsupported sitting Tub/Shower Transfer: Performed;Modified independent Tub/Shower Transfer Method: Ecologist with back Equipment Used: Back brace Transfers/Ambulation Related to ADLs: Independent ADL Comments: Pt says he has no issues with applying her brace. Pt able to state 3/3 back precautions.      OT Goals ADL Goals ADL Goal: Lower Body Bathing - Progress: Met ADL Goal: Lower Body Dressing - Progress: Met ADL Goal: Tub/Shower Transfer - Progress: Met Miscellaneous OT Goals OT Goal: Miscellaneous Goal #1 - Progress: Met OT Goal: Miscellaneous Goal #3 - Progress: Met  Visit Information  Last OT Received On: 02/13/12 Assistance Needed: +1    Subjective Data  Subjective: I feel better  today, yesterday was a little rough      Cognition  Overall Cognitive Status: Appears within functional limits for tasks assessed/performed Arousal/Alertness: Awake/alert Orientation Level: Appears intact for tasks assessed Behavior During Session: [facility] for tasks performed    Mobility   Bed Mobility Bed Mobility: Sit to Sidelying Left;Rolling Right Rolling Right: 6: Modified independent (Device/Increase time) Sit to Sidelying Left: 6: Modified independent (Device/Increase time) Transfers Stand to Sit: 6: Modified independent (Device/Increase time);With upper extremity assist;To bed Details for Transfer Assistance: good tall posture             End of Session OT - End of Session Equipment Utilized During Treatment: Back brace Activity Tolerance: Patient tolerated treatment well Patient left:  (With PT going back to gym to do stairs) Nurse Communication:  (Ok'd by Dr. Yetta Barre to shower, pt needs order for tub seat)    Evette Georges [phone] 02/13/2012, 9:59 AM

## 2012-02-13 NOTE — Progress Notes (Signed)
Spoke to MD Yetta Barre over telephone who says he will call in robaxin Rx to the [facility] on [address]. Pt has physical script for percocet  Virginia [name], Virginia [name]   All DME equipment delivered to room Virginia [name], Virginia [name]

## 2012-03-29 DIAGNOSIS — M4310 Spondylolisthesis, site unspecified: Secondary | ICD-10-CM | POA: Diagnosis not present

## 2012-05-30 DIAGNOSIS — M4310 Spondylolisthesis, site unspecified: Secondary | ICD-10-CM | POA: Diagnosis not present

## 2012-08-29 DIAGNOSIS — M545 Low back pain, unspecified: Secondary | ICD-10-CM | POA: Diagnosis not present

## 2012-12-22 DIAGNOSIS — E039 Hypothyroidism, unspecified: Secondary | ICD-10-CM | POA: Diagnosis not present

## 2012-12-22 DIAGNOSIS — N952 Postmenopausal atrophic vaginitis: Secondary | ICD-10-CM | POA: Diagnosis not present

## 2012-12-22 DIAGNOSIS — E279 Disorder of adrenal gland, unspecified: Secondary | ICD-10-CM | POA: Diagnosis not present

## 2013-06-22 DIAGNOSIS — E559 Vitamin D deficiency, unspecified: Secondary | ICD-10-CM | POA: Diagnosis not present

## 2013-06-22 DIAGNOSIS — E509 Vitamin A deficiency, unspecified: Secondary | ICD-10-CM | POA: Diagnosis not present

## 2013-06-22 DIAGNOSIS — E639 Nutritional deficiency, unspecified: Secondary | ICD-10-CM | POA: Diagnosis not present

## 2013-06-22 DIAGNOSIS — E039 Hypothyroidism, unspecified: Secondary | ICD-10-CM | POA: Diagnosis not present

## 2013-06-22 DIAGNOSIS — N951 Menopausal and female climacteric states: Secondary | ICD-10-CM | POA: Diagnosis not present

## 2013-06-22 DIAGNOSIS — R5383 Other fatigue: Secondary | ICD-10-CM | POA: Diagnosis not present

## 2013-06-22 DIAGNOSIS — N760 Acute vaginitis: Secondary | ICD-10-CM | POA: Diagnosis not present

## 2013-06-22 DIAGNOSIS — R5381 Other malaise: Secondary | ICD-10-CM | POA: Diagnosis not present

## 2013-06-22 DIAGNOSIS — R7989 Other specified abnormal findings of blood chemistry: Secondary | ICD-10-CM | POA: Diagnosis not present

## 2013-11-09 DIAGNOSIS — E509 Vitamin A deficiency, unspecified: Secondary | ICD-10-CM | POA: Diagnosis not present

## 2013-11-09 DIAGNOSIS — N3000 Acute cystitis without hematuria: Secondary | ICD-10-CM | POA: Diagnosis not present

## 2013-11-09 DIAGNOSIS — M545 Low back pain, unspecified: Secondary | ICD-10-CM | POA: Diagnosis not present

## 2013-11-09 DIAGNOSIS — E279 Disorder of adrenal gland, unspecified: Secondary | ICD-10-CM | POA: Diagnosis not present

## 2013-11-09 DIAGNOSIS — E559 Vitamin D deficiency, unspecified: Secondary | ICD-10-CM | POA: Diagnosis not present

## 2013-11-09 DIAGNOSIS — R5381 Other malaise: Secondary | ICD-10-CM | POA: Diagnosis not present

## 2013-12-15 DIAGNOSIS — N3020 Other chronic cystitis without hematuria: Secondary | ICD-10-CM | POA: Diagnosis not present

## 2014-05-03 DIAGNOSIS — E509 Vitamin A deficiency, unspecified: Secondary | ICD-10-CM | POA: Diagnosis not present

## 2014-05-03 DIAGNOSIS — E559 Vitamin D deficiency, unspecified: Secondary | ICD-10-CM | POA: Diagnosis not present

## 2014-05-03 DIAGNOSIS — Z91018 Allergy to other foods: Secondary | ICD-10-CM | POA: Diagnosis not present

## 2014-07-25 DIAGNOSIS — J029 Acute pharyngitis, unspecified: Secondary | ICD-10-CM | POA: Diagnosis not present

## 2014-07-25 DIAGNOSIS — J309 Allergic rhinitis, unspecified: Secondary | ICD-10-CM | POA: Diagnosis not present

## 2014-08-18 IMAGING — US US ABDOMEN COMPLETE
1 series · 14 of 25 positions shown · non-contrast
Comparison: CT abdomen pelvis 10/03/2011 and ultrasound abdomen
08/19/2005.

CLINICAL DATA: Hepatomegaly.

COMPLETE ABDOMINAL ULTRASOUND

[Series 1: us abdomen complete · 0.20mm/px · 14 of 75 slices shown]
[im 1/75]
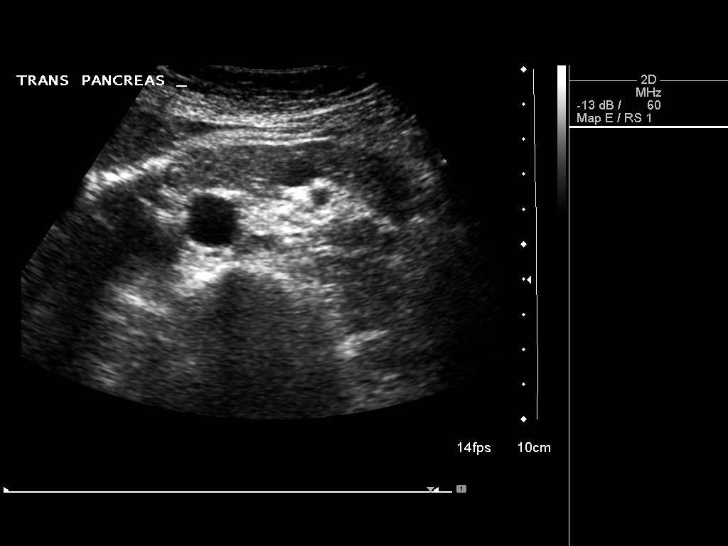
[im 7/75]
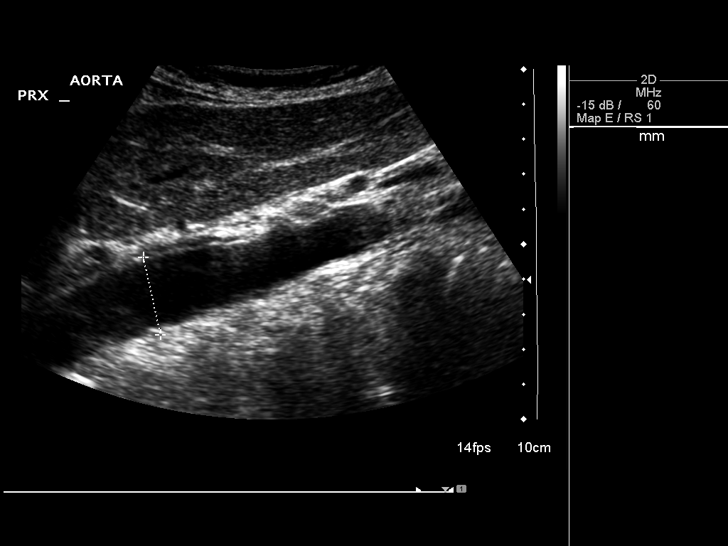
[im 13/75]
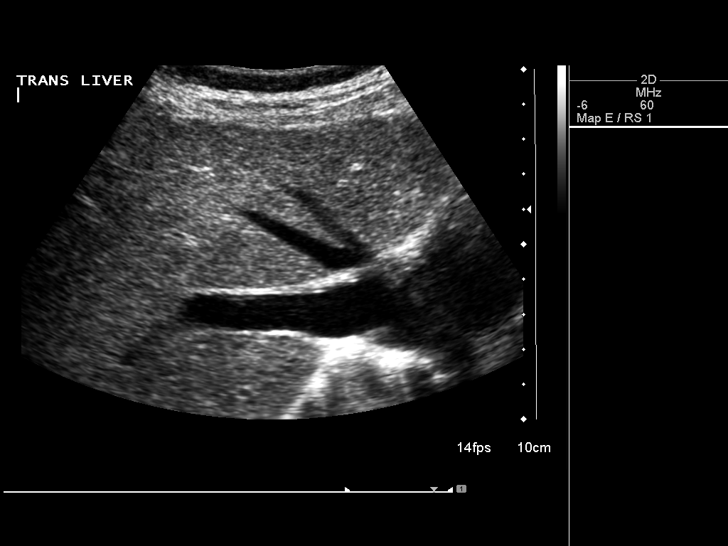
[im 19/75]
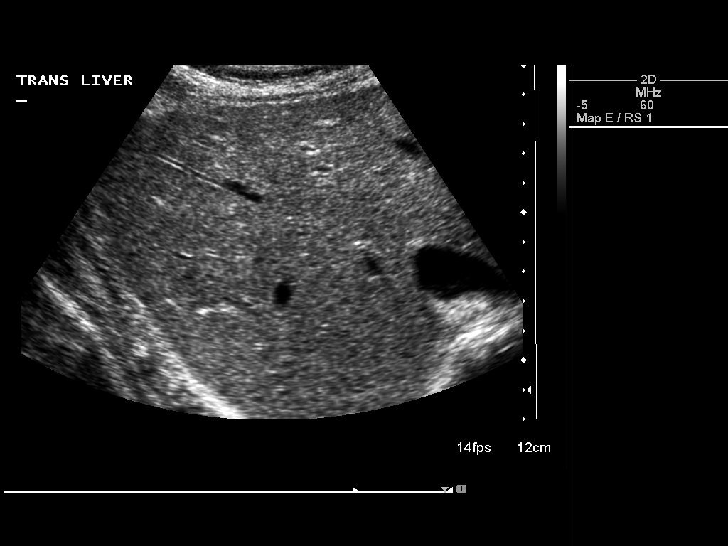
[im 25/75]
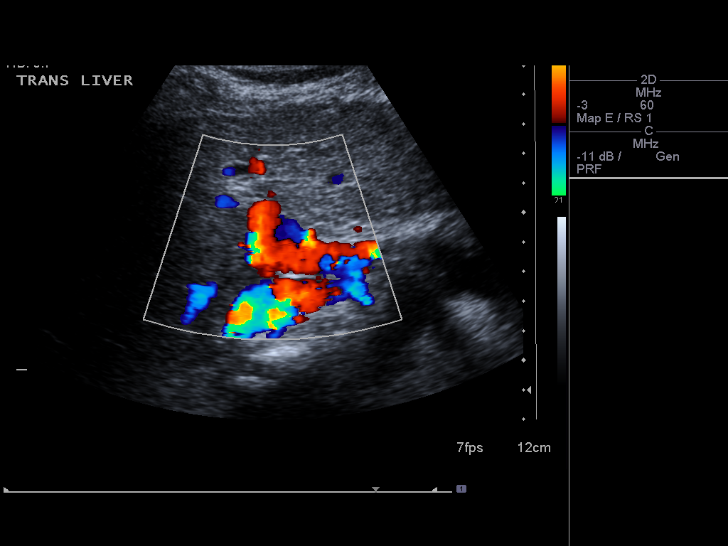
[im 28/75]
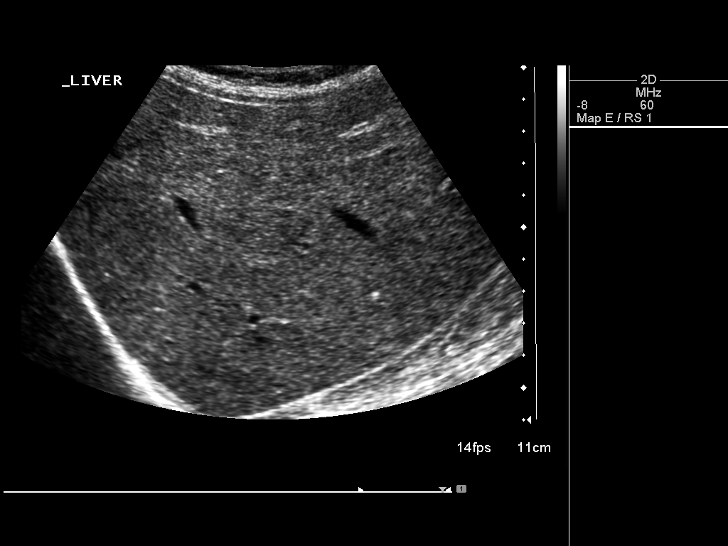
[im 34/75]
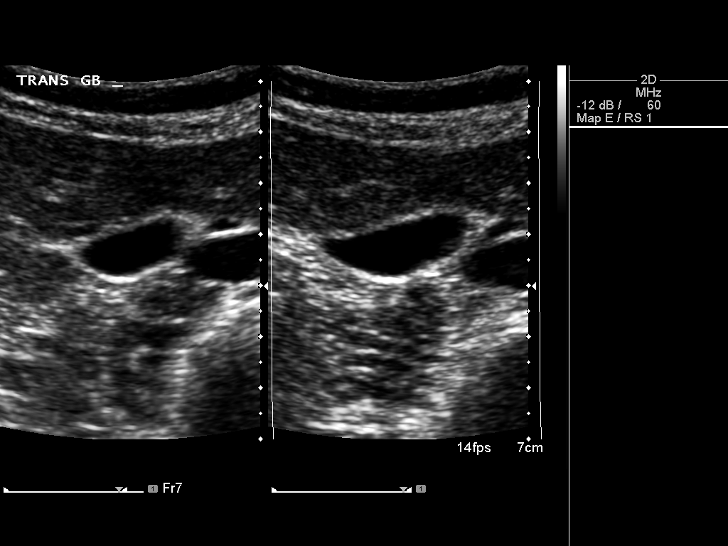
[im 41/75]
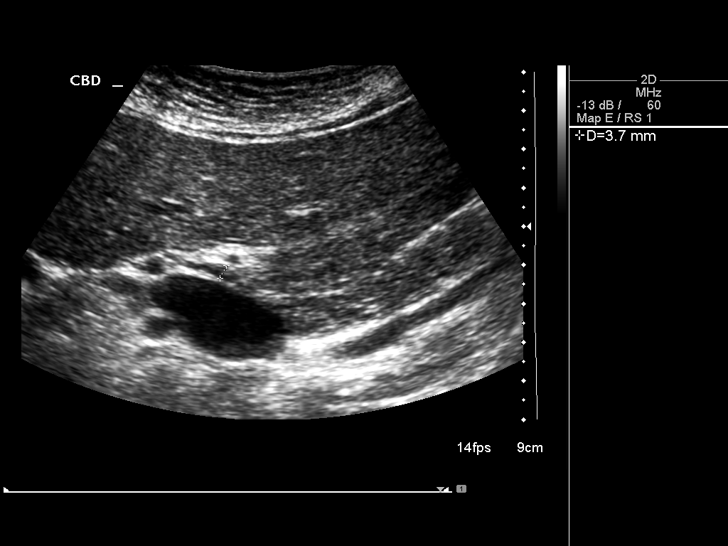
[im 47/75]
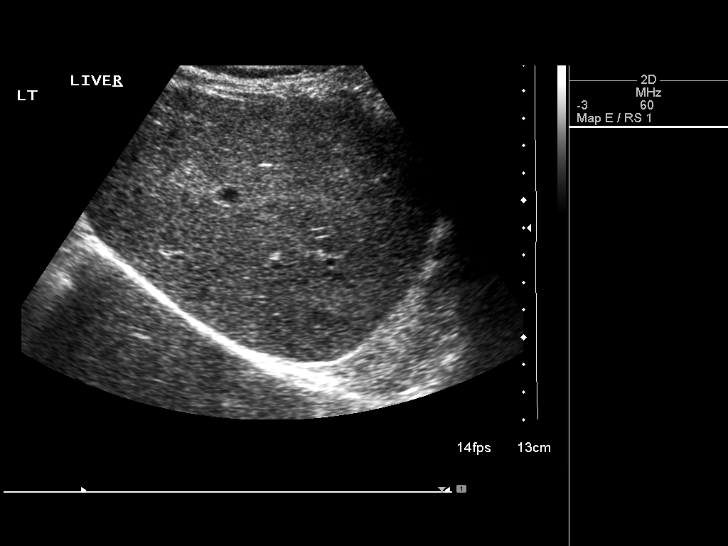
[im 50/75]
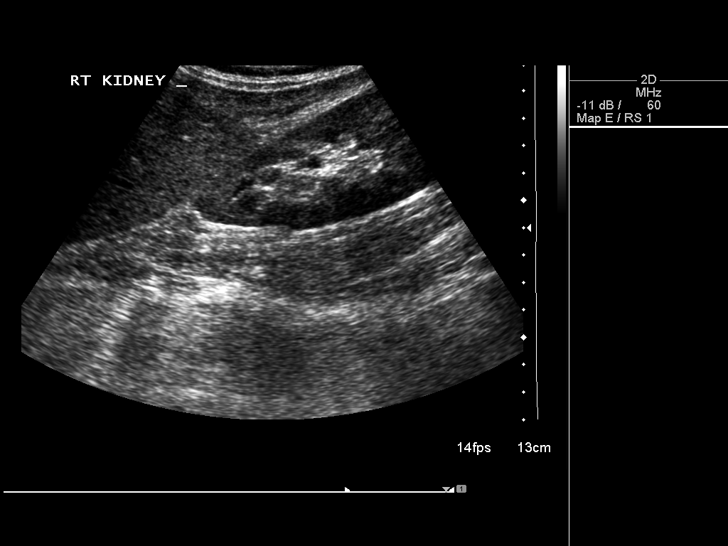
[im 56/75]
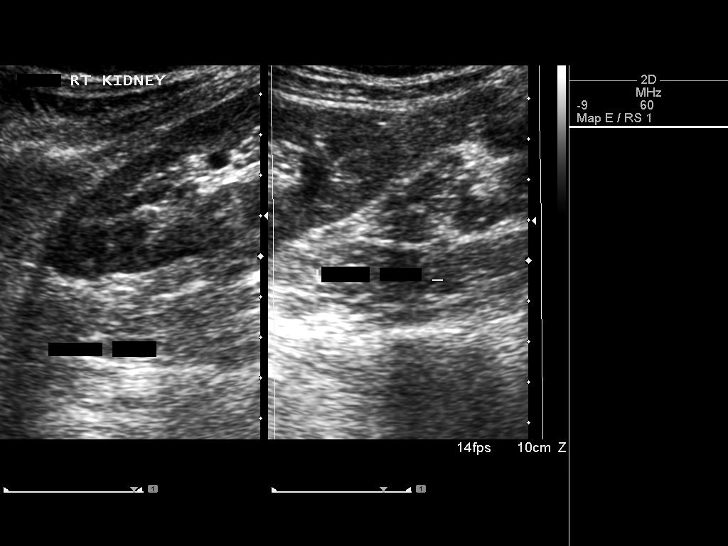
[im 62/75]
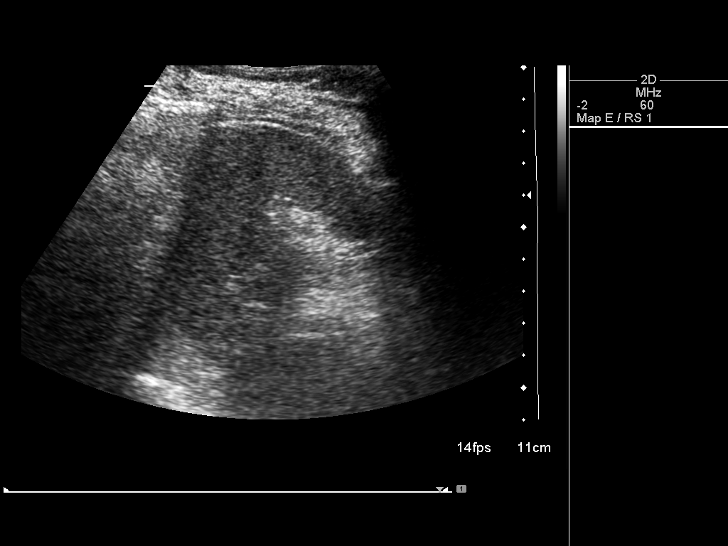
[im 68/75]
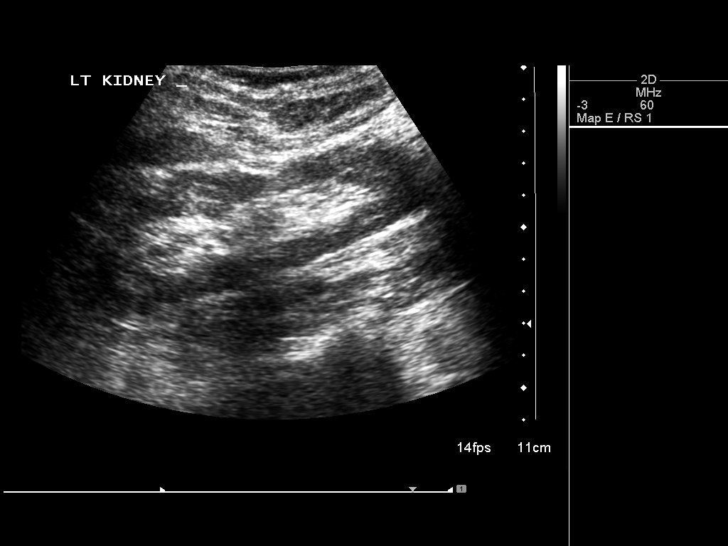
[im 75/75]
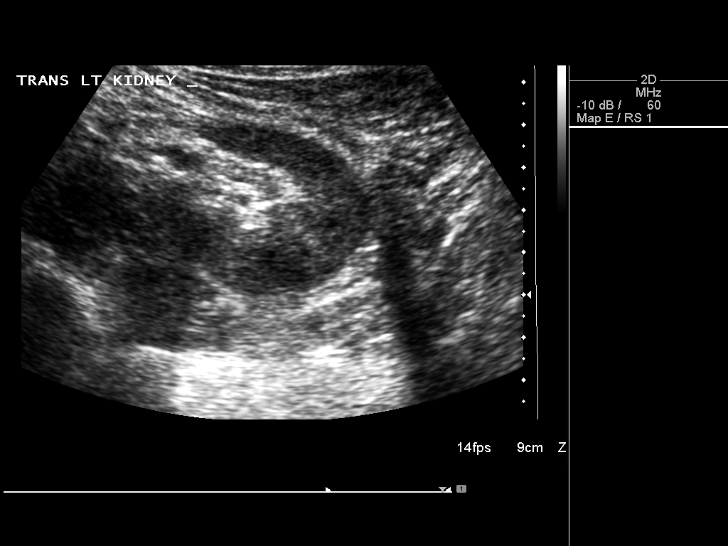

[14 of 25 positions shown; findings below may reference images not displayed]

FINDINGS: Gallbladder:  No gallstones, gallbladder wall thickening, or
pericholecystic fluid.

Common bile duct:  Measures 4 mm, within normal limits.

Liver:  No focal lesion identified.  Within normal limits in
parenchymal echogenicity.

IVC:  Appears normal.

Pancreas:  No focal abnormality seen.

Spleen:  Measures 8.2 cm, negative.

Right Kidney:  Measures 12.3 cm.  Parenchymal echogenicity is
normal.  No hydronephrosis.

Left Kidney:  Measures 11.6 cm.  Parenchymal echogenicity is
normal.  No hydronephrosis.

Abdominal aorta:  No aneurysm identified.
IMPRESSION: Negative abdominal ultrasound.

## 2014-09-18 DIAGNOSIS — E639 Nutritional deficiency, unspecified: Secondary | ICD-10-CM | POA: Diagnosis not present

## 2014-09-18 DIAGNOSIS — E559 Vitamin D deficiency, unspecified: Secondary | ICD-10-CM | POA: Diagnosis not present

## 2014-09-18 DIAGNOSIS — R5383 Other fatigue: Secondary | ICD-10-CM | POA: Diagnosis not present

## 2014-11-22 DIAGNOSIS — C6932 Malignant neoplasm of left choroid: Secondary | ICD-10-CM | POA: Diagnosis not present

## 2014-11-22 DIAGNOSIS — H43813 Vitreous degeneration, bilateral: Secondary | ICD-10-CM | POA: Diagnosis not present

## 2014-11-27 DIAGNOSIS — M549 Dorsalgia, unspecified: Secondary | ICD-10-CM | POA: Diagnosis not present

## 2014-11-27 DIAGNOSIS — M5430 Sciatica, unspecified side: Secondary | ICD-10-CM | POA: Diagnosis not present

## 2014-11-27 DIAGNOSIS — H35362 Drusen (degenerative) of macula, left eye: Secondary | ICD-10-CM | POA: Diagnosis not present

## 2014-11-27 DIAGNOSIS — Z83518 Family history of other specified eye disorder: Secondary | ICD-10-CM | POA: Diagnosis not present

## 2014-11-27 DIAGNOSIS — D3132 Benign neoplasm of left choroid: Secondary | ICD-10-CM | POA: Diagnosis not present

## 2014-11-27 DIAGNOSIS — R6889 Other general symptoms and signs: Secondary | ICD-10-CM | POA: Diagnosis not present

## 2014-11-27 DIAGNOSIS — Z8719 Personal history of other diseases of the digestive system: Secondary | ICD-10-CM | POA: Diagnosis not present

## 2014-11-27 DIAGNOSIS — R450 Nervousness: Secondary | ICD-10-CM | POA: Diagnosis not present

## 2014-11-27 DIAGNOSIS — R5381 Other malaise: Secondary | ICD-10-CM | POA: Diagnosis not present

## 2014-11-27 DIAGNOSIS — R002 Palpitations: Secondary | ICD-10-CM | POA: Diagnosis not present

## 2014-11-27 DIAGNOSIS — H2513 Age-related nuclear cataract, bilateral: Secondary | ICD-10-CM | POA: Diagnosis not present

## 2014-11-27 DIAGNOSIS — Z8249 Family history of ischemic heart disease and other diseases of the circulatory system: Secondary | ICD-10-CM | POA: Diagnosis not present

## 2014-11-27 DIAGNOSIS — M810 Age-related osteoporosis without current pathological fracture: Secondary | ICD-10-CM | POA: Diagnosis not present

## 2014-11-27 DIAGNOSIS — G4700 Insomnia, unspecified: Secondary | ICD-10-CM | POA: Diagnosis not present

## 2014-11-27 DIAGNOSIS — R062 Wheezing: Secondary | ICD-10-CM | POA: Diagnosis not present

## 2014-11-27 DIAGNOSIS — E039 Hypothyroidism, unspecified: Secondary | ICD-10-CM | POA: Diagnosis not present

## 2014-11-27 DIAGNOSIS — Z79899 Other long term (current) drug therapy: Secondary | ICD-10-CM | POA: Diagnosis not present

## 2014-11-27 DIAGNOSIS — Z823 Family history of stroke: Secondary | ICD-10-CM | POA: Diagnosis not present

## 2014-11-27 DIAGNOSIS — J029 Acute pharyngitis, unspecified: Secondary | ICD-10-CM | POA: Diagnosis not present

## 2014-11-27 DIAGNOSIS — R51 Headache: Secondary | ICD-10-CM | POA: Diagnosis not present

## 2014-11-27 DIAGNOSIS — Z888 Allergy status to other drugs, medicaments and biological substances status: Secondary | ICD-10-CM | POA: Diagnosis not present

## 2014-11-27 DIAGNOSIS — L299 Pruritus, unspecified: Secondary | ICD-10-CM | POA: Diagnosis not present

## 2015-02-26 DIAGNOSIS — M5430 Sciatica, unspecified side: Secondary | ICD-10-CM | POA: Diagnosis not present

## 2015-02-26 DIAGNOSIS — H35362 Drusen (degenerative) of macula, left eye: Secondary | ICD-10-CM | POA: Diagnosis not present

## 2015-02-26 DIAGNOSIS — M2550 Pain in unspecified joint: Secondary | ICD-10-CM | POA: Diagnosis not present

## 2015-02-26 DIAGNOSIS — H43392 Other vitreous opacities, left eye: Secondary | ICD-10-CM | POA: Diagnosis not present

## 2015-02-26 DIAGNOSIS — M549 Dorsalgia, unspecified: Secondary | ICD-10-CM | POA: Diagnosis not present

## 2015-02-26 DIAGNOSIS — K929 Disease of digestive system, unspecified: Secondary | ICD-10-CM | POA: Diagnosis not present

## 2015-02-26 DIAGNOSIS — H538 Other visual disturbances: Secondary | ICD-10-CM | POA: Diagnosis not present

## 2015-02-26 DIAGNOSIS — E039 Hypothyroidism, unspecified: Secondary | ICD-10-CM | POA: Diagnosis not present

## 2015-02-26 DIAGNOSIS — D3132 Benign neoplasm of left choroid: Secondary | ICD-10-CM | POA: Diagnosis not present

## 2015-02-26 DIAGNOSIS — M810 Age-related osteoporosis without current pathological fracture: Secondary | ICD-10-CM | POA: Diagnosis not present

## 2015-02-26 DIAGNOSIS — H2513 Age-related nuclear cataract, bilateral: Secondary | ICD-10-CM | POA: Diagnosis not present

## 2015-02-26 DIAGNOSIS — M542 Cervicalgia: Secondary | ICD-10-CM | POA: Diagnosis not present

## 2015-03-13 DIAGNOSIS — N952 Postmenopausal atrophic vaginitis: Secondary | ICD-10-CM | POA: Diagnosis not present

## 2015-03-13 DIAGNOSIS — E039 Hypothyroidism, unspecified: Secondary | ICD-10-CM | POA: Diagnosis not present

## 2015-03-13 DIAGNOSIS — E559 Vitamin D deficiency, unspecified: Secondary | ICD-10-CM | POA: Diagnosis not present

## 2015-03-13 DIAGNOSIS — E639 Nutritional deficiency, unspecified: Secondary | ICD-10-CM | POA: Diagnosis not present

## 2015-03-13 DIAGNOSIS — R5383 Other fatigue: Secondary | ICD-10-CM | POA: Diagnosis not present

## 2015-03-13 DIAGNOSIS — E611 Iron deficiency: Secondary | ICD-10-CM | POA: Diagnosis not present

## 2015-03-13 DIAGNOSIS — E509 Vitamin A deficiency, unspecified: Secondary | ICD-10-CM | POA: Diagnosis not present

## 2015-03-14 ENCOUNTER — Other Ambulatory Visit: Payer: Self-pay | Admitting: Internal Medicine

## 2015-03-14 DIAGNOSIS — N63 Unspecified lump in unspecified breast: Secondary | ICD-10-CM

## 2015-03-21 ENCOUNTER — Ambulatory Visit
Admission: RE | Admit: 2015-03-21 | Discharge: 2015-03-21 | Disposition: A | Discharge status: Home / Self Care | Payer: Medicare Other | Source: Ambulatory Visit | Attending: Internal Medicine | Admitting: Internal Medicine

## 2015-03-21 ENCOUNTER — Other Ambulatory Visit: Payer: Self-pay | Admitting: Internal Medicine

## 2015-03-21 DIAGNOSIS — N63 Unspecified lump in unspecified breast: Secondary | ICD-10-CM

## 2015-03-26 ENCOUNTER — Other Ambulatory Visit: Payer: Self-pay | Admitting: Internal Medicine

## 2015-03-26 DIAGNOSIS — N63 Unspecified lump in unspecified breast: Secondary | ICD-10-CM

## 2015-03-29 ENCOUNTER — Ambulatory Visit
Admission: RE | Admit: 2015-03-29 | Discharge: 2015-03-29 | Disposition: A | Discharge status: Home / Self Care | Payer: Medicare Other | Source: Ambulatory Visit | Attending: Internal Medicine | Admitting: Internal Medicine

## 2015-03-29 DIAGNOSIS — N63 Unspecified lump in unspecified breast: Secondary | ICD-10-CM

## 2015-03-29 DIAGNOSIS — C50411 Malignant neoplasm of upper-outer quadrant of right female breast: Secondary | ICD-10-CM | POA: Diagnosis not present

## 2015-04-01 ENCOUNTER — Other Ambulatory Visit: Payer: Self-pay | Admitting: General Surgery

## 2015-04-01 DIAGNOSIS — C50211 Malignant neoplasm of upper-inner quadrant of right female breast: Secondary | ICD-10-CM

## 2015-04-04 ENCOUNTER — Telehealth: Payer: Self-pay | Admitting: Hematology and Oncology

## 2015-04-04 NOTE — Telephone Encounter (Signed)
new breast appt-s/w patient and gave np appt for 01/23 @ 1 w/Dr. Lindi Adie Referring Dr. Barry Dienes  Referral information scanned under media tab

## 2015-04-15 ENCOUNTER — Encounter: Payer: Self-pay | Admitting: *Deleted

## 2015-04-15 ENCOUNTER — Encounter: Payer: Self-pay | Admitting: Hematology and Oncology

## 2015-04-15 ENCOUNTER — Ambulatory Visit (HOSPITAL_BASED_OUTPATIENT_CLINIC_OR_DEPARTMENT_OTHER): Payer: Medicare Other | Admitting: Hematology and Oncology

## 2015-04-15 VITALS — BP 106/66 | HR 74 | Temp 97.9°F | Resp 18 | Ht 65.0 in | Wt <= 1120 oz

## 2015-04-15 DIAGNOSIS — C50411 Malignant neoplasm of upper-outer quadrant of right female breast: Secondary | ICD-10-CM | POA: Insufficient documentation

## 2015-04-15 NOTE — Assessment & Plan Note (Signed)
Right breast biopsy 03/27/2015: Invasive ductal carcinoma, grade 1, ER 0%, PR 0%, HER-2 positive ratio 9.96, Ki-67 20%; T1cN0 stage IA clinical stage Mammogram and ultrasound 03/21/2015: Right breast mass. Irregularly shaped upper central posterior third measuring 1.1 x 1.1 x 1 cm  Pathology and radiology counseling: Discussed with the patient, the details of pathology including the type of breast cancer,the clinical staging, the significance of ER, PR and HER-2/neu receptors and the implications for treatment. After reviewing the pathology in detail, we proceeded to discuss the different treatment options between surgery, radiation, chemotherapy, antiestrogen therapies.  Recommendation based a multidisciplinary tumor board: 1. Breast conserving surgery followed by 2. Adjuvant chemotherapy with TCH followed by Herceptin maintenance for 1 year 3. Followed by adjuvant radiation therapy  Patient will undergo port placement and echocardiogram and chemotherapy education once we have the final plan. Return to clinic after surgery to finalize a treatment plan.

## 2015-04-15 NOTE — Progress Notes (Signed)
[facility] CONSULT NOTE  Patient Care Team: Lollie Sails, MD as PCP - General (Internal Medicine)  CHIEF COMPLAINTS/PURPOSE OF CONSULTATION:  Newly diagnosed breast cancer  HISTORY OF PRESENTING ILLNESS:  Virginia [name] 65 y.o. female is here because of recent diagnosis of right breast cancer. Patient felt a palpable lump in October 2016. She brought it to the attention of her primary care physician community obtain a mammogram end of December 2016. She subsequently underwent a biopsy on 03/29/2015. By mammogram and ultrasound the tumor was 1.1 cm in size. Biopsy revealed that this was invasive ductal carcinoma that was ER PR negative HER-2 positive with a Ki-67 20% and grade 1. She was present for the multidisciplinary tumor board and she is here today to discuss the overall treatment plan.   I reviewed her records extensively and collaborated the history with the patient.  SUMMARY OF ONCOLOGIC HISTORY:   Breast cancer of upper-outer quadrant of right female breast ([place])   03/21/2015 Mammogram Right breast mass. Irregularly shaped upper central posterior third measuring 1.1 x 1.1 x 1 cm   03/29/2015 Initial Diagnosis Right breast biopsy: Invasive ductal carcinoma, grade 1, ER 0%, PR 0%, HER-2 positive ratio 9.96, Ki-67 20%; T1cN0 stage IA clinical stage   MEDICAL HISTORY:  Past Medical History  Diagnosis Date  . Back pain with radiation   . Low back pain with sciatica   . Palpitations   . Generalized headaches   . Lymph nodes enlarged   . Nasal congestion   . Sore throat   . Cough   . Abdominal pain   . Abdominal distension   . GERD (gastroesophageal reflux disease)   . Dysrhythmia   . Hypothyroidism   . Seizures     Phergan caused it  . Arthritis   . Fibromyalgia     "no longer"    SURGICAL HISTORY: Past Surgical History  Procedure Laterality Date  . Laparoscopic appendectomy  10/03/2011    Procedure: APPENDECTOMY LAPAROSCOPIC;  Surgeon:  Zenovia Jarred, MD;  Location: [facility];  Service: General;  Laterality: N/A;  . Appendectomy  10/03/11  . Cesarean section  09/09/81  . Wisdom tooth extraction  1973 - approximate    SOCIAL HISTORY: Social History   Social History  . Marital Status: Married    Spouse Name: N/A  . Number of Children: N/A  . Years of Education: N/A   Occupational History  . Not on file.   Social History Main Topics  . Smoking status: Never Smoker   . Smokeless tobacco: Never Used  . Alcohol Use: No  . Drug Use: No  . Sexual Activity: Not on file   Other Topics Concern  . Not on file   Social History Narrative  . No narrative on file    FAMILY HISTORY: Family History  Problem Relation Age of Onset  . Emphysema Father     ALLERGIES:  is allergic to other and phenergan.  MEDICATIONS:  Current Outpatient Prescriptions  Medication Sig Dispense Refill  . Artificial Tear Solution (BION TEARS OP) Apply 1 drop to eye 3 (three) times daily as needed. Dry eyes    . Ascorbic Acid (VITAMIN C) POWD Take 2.5 g by mouth 2 (two) times daily.    Marland Kitchen CALCIUM PO Take 1-2 tablets by mouth 2 (two) times daily. Take 1 tablet every morning and take 2 tablets every evening.    . Cholecalciferol (VITAMIN D PO) Take 4 drops by mouth daily.    Marland Kitchen  Coenzyme Q10 (CO Q 10) 100 MG CAPS Take 100 mg by mouth daily.    . Digestive Enzymes CAPS Take 1 capsule by mouth 3 (three) times daily with meals.    . Estriol Micronized POWD Place 0.3 g vaginally at bedtime. Estriol HRT 0.35m/gm cream.    . MAGNESIUM PO Take 1 tablet by mouth daily.    . methocarbamol (ROBAXIN) 500 MG tablet Take 1 tablet (500 mg total) by mouth every 6 (six) hours as needed. 60 tablet 1  . Misc Natural Products (ADRENAL PO) Take 1 tablet by mouth daily.    . Multiple Vitamin (MULTIVITAMIN WITH MINERALS) TABS Take 1 tablet by mouth daily.    . Omega-3 Fatty Acids (FISH OIL PO) Take 20 mLs by mouth 2 (two) times daily.    .Marland Kitchenomeprazole (PRILOSEC OTC)  20 MG tablet Take 20 mg by mouth daily.    .Marland KitchenOVER THE COUNTER MEDICATION Apply 1 application topically every morning. Source Naturals Progesterone Cream.    . OVER THE COUNTER MEDICATION Take 1 tablet by mouth daily. [place] Tablets.    .Marland KitchenoxyCODONE-acetaminophen (PERCOCET/ROXICET) 5-325 MG per tablet Take 1-2 tablets by mouth every 6 (six) hours as needed for pain. 90 tablet 0  . Probiotic Product (PROBIOTIC DAILY PO) Take 1 capsule by mouth daily.    . Silicone LIQD Take 3 drops by mouth 2 (two) times daily.     . sodium chloride (OCEAN) 0.65 % nasal spray Place 1 spray into the nose daily as needed. For congestion.    . Sodium Chloride-Sodium Bicarb (SINUS WASH NETI POT NA) Place 2 packets into the nose 2 (two) times daily.     . ST JOHNS WORT PO Take 0.5 Applicatorfuls by mouth daily.     .Marland Kitchenthyroid (ARMOUR) 120 MG tablet Take 120 mg by mouth daily.    . Zinc 50 MG CAPS Take 50 mg by mouth daily.    .Marland Kitchenzolpidem (AMBIEN) 10 MG tablet Take 5 mg by mouth at bedtime. For sleep.     No current facility-administered medications for this visit.    REVIEW OF SYSTEMS:   Constitutional: Denies fevers, chills or abnormal night sweats Eyes: Denies blurriness of vision, double vision or watery eyes Ears, nose, mouth, throat, and face: Denies mucositis or sore throat Respiratory: Denies cough, dyspnea or wheezes Cardiovascular: Denies palpitation, chest discomfort or lower extremity swelling Gastrointestinal:  Denies nausea, heartburn or change in bowel habits Skin: Denies abnormal skin rashes Lymphatics: Denies new lymphadenopathy or easy bruising Neurological:Denies numbness, tingling or new weaknesses Behavioral/Psych: Mood is stable, no new changes  Breast:palpable lump in the right breast   All other systems were reviewed with the patient and are negative.  PHYSICAL EXAMINATION: ECOG PERFORMANCE STATUS: 0 - Asymptomatic  Filed Vitals:   04/15/15 1301  BP: 106/66  Pulse: 74  Temp: 97.9  F (36.6 C)  Resp: 18   Filed Weights   04/15/15 1301  Weight: 17 lb 8 oz (7.938 kg)    GENERAL:alert, no distress and comfortable SKIN: skin color, texture, turgor are normal, no rashes or significant lesions EYES: normal, conjunctiva are pink and non-injected, sclera clear OROPHARYNX:no exudate, no erythema and lips, buccal mucosa, and tongue normal  NECK: supple, thyroid normal size, non-tender, without nodularity LYMPH:  no palpable lymphadenopathy in the cervical, axillary or inguinal LUNGS: clear to auscultation and percussion with normal breathing effort HEART: regular rate & rhythm and no murmurs and no lower extremity edema ABDOMEN:abdomen soft,  non-tender and normal bowel sounds Musculoskeletal:no cyanosis of digits and no clubbing  PSYCH: alert & oriented x 3 with fluent speech NEURO: no focal motor/sensory deficits BREAST: right breast lump 2 cm in size No palpable axillary or supraclavicular lymphadenopathy (exam performed in the presence of a chaperone)   LABORATORY DATA:  I have reviewed the data as listed Lab Results  Component Value Date   WBC 3.7* 02/11/2012   HGB 13.2 02/11/2012   HCT 38.7 02/11/2012   MCV 88.0 02/11/2012   PLT 230 02/11/2012   Lab Results  Component Value Date   NA 139 02/11/2012   K 3.7 02/11/2012   CL 106 02/11/2012   CO2 25 02/11/2012    RADIOGRAPHIC STUDIES: I have personally reviewed the radiological reports and agreed with the findings in the report.  ASSESSMENT AND PLAN:  Breast cancer of upper-outer quadrant of right female breast ([place]) Right breast biopsy 03/27/2015: Invasive ductal carcinoma, grade 1, ER 0%, PR 0%, HER-2 positive ratio 9.96, Ki-67 20%; T1cN0 stage IA clinical stage Mammogram and ultrasound 03/21/2015: Right breast mass. Irregularly shaped upper central posterior third measuring 1.1 x 1.1 x 1 cm  Pathology and radiology counseling: Discussed with the patient, the details of pathology including the type of  breast cancer,the clinical staging, the significance of ER, PR and HER-2/neu receptors and the implications for treatment. After reviewing the pathology in detail, we proceeded to discuss the different treatment options between surgery, radiation, chemotherapy, antiestrogen therapies.  Recommendation based a multidisciplinary tumor board: 1. Breast conserving surgery followed by 2. Adjuvant chemotherapy with TCH followed by Herceptin maintenance for 1 year 3. Followed by adjuvant radiation therapy  Patient will undergo port placement and echocardiogram and chemotherapy education once we have the final plan. Return to clinic after surgery to finalize a treatment plan.   All questions were answered.      Rulon Eisenmenger, MD 04/15/2015

## 2015-04-15 NOTE — Progress Notes (Signed)
Note created by Dr. Gudena during office visit, copy to patient,original to scan. 

## 2015-04-15 NOTE — Addendum Note (Signed)
Addended by: Renford Dills on: 04/15/2015 02:31 PM   Modules accepted: Orders, Medications

## 2015-04-17 ENCOUNTER — Ambulatory Visit: Payer: Medicare Other

## 2015-04-17 ENCOUNTER — Ambulatory Visit: Payer: Medicare Other | Admitting: Radiation Oncology

## 2015-04-18 ENCOUNTER — Other Ambulatory Visit: Payer: Self-pay | Admitting: General Surgery

## 2015-04-18 NOTE — Progress Notes (Signed)
Location of Breast Cancer: Right Breast  Histology per Pathology Report:  03/29/15 Diagnosis Breast, right, needle core biopsy, right breast upper outer at 12:30 - INVASIVE DUCTAL CARCINOMA.   Receptor Status: ER(NEG), PR (NEG), Her2-neu (POS)  Did patient present with symptoms or was this found on screening mammography?: She felt a lump in October.  Past/Anticipated interventions by surgeon, if any: To have Lumpectomy scheduled with Dr. Barry Dienes. She is scheduled to have her sentinal node injection today at 2:00.  Past/Anticipated interventions by medical oncology, if any: She saw Dr. Lindi Adie on 04/15/15 and he recommends: Recommendation based a multidisciplinary tumor board: 1. Breast conserving surgery followed by 2. Adjuvant chemotherapy with TCH followed by Herceptin maintenance for 1 year 3. Followed by adjuvant radiation therapy   Lymphedema issues, if any:    Pain issues, if any:    SAFETY ISSUES:  Prior radiation?   Pacemaker/ICD?   Possible current pregnancy? No  Is the patient on methotrexate?  Current Complaints / other details:   Age of menarche: 40 years Age of menopause: 69-51 Contraceptive history: Oral contraceptives Gravida: 2 Maternal age: 67-30 Para: 2    Fadi Menter, Stephani Police, RN 04/18/2015,2:28 PM

## 2015-04-19 ENCOUNTER — Encounter (HOSPITAL_BASED_OUTPATIENT_CLINIC_OR_DEPARTMENT_OTHER): Payer: Self-pay | Admitting: *Deleted

## 2015-04-22 ENCOUNTER — Other Ambulatory Visit: Payer: Self-pay | Admitting: *Deleted

## 2015-04-24 ENCOUNTER — Ambulatory Visit (HOSPITAL_BASED_OUTPATIENT_CLINIC_OR_DEPARTMENT_OTHER): Payer: Medicare Other | Admitting: Anesthesiology

## 2015-04-24 ENCOUNTER — Ambulatory Visit: Payer: Medicare Other

## 2015-04-24 ENCOUNTER — Encounter (HOSPITAL_BASED_OUTPATIENT_CLINIC_OR_DEPARTMENT_OTHER)
Admission: RE | Disposition: A | Discharge status: Home / Self Care | Payer: Self-pay | Source: Ambulatory Visit | Attending: General Surgery

## 2015-04-24 ENCOUNTER — Ambulatory Visit
Admission: RE | Admit: 2015-04-24 | Discharge: 2015-04-24 | Disposition: A | Discharge status: Home / Self Care | Payer: Medicare Other | Source: Ambulatory Visit | Attending: Radiation Oncology | Admitting: Radiation Oncology

## 2015-04-24 ENCOUNTER — Ambulatory Visit (HOSPITAL_BASED_OUTPATIENT_CLINIC_OR_DEPARTMENT_OTHER)
Admission: RE | Admit: 2015-04-24 | Discharge: 2015-04-24 | Disposition: A | Discharge status: Home / Self Care | Payer: Medicare Other | Source: Ambulatory Visit | Attending: General Surgery | Admitting: General Surgery

## 2015-04-24 ENCOUNTER — Ambulatory Visit (HOSPITAL_COMMUNITY)
Admission: RE | Admit: 2015-04-24 | Discharge: 2015-04-24 | Disposition: A | Discharge status: Home / Self Care | Payer: Medicare Other | Source: Ambulatory Visit | Attending: General Surgery | Admitting: General Surgery

## 2015-04-24 ENCOUNTER — Encounter (HOSPITAL_BASED_OUTPATIENT_CLINIC_OR_DEPARTMENT_OTHER): Payer: Self-pay | Admitting: *Deleted

## 2015-04-24 ENCOUNTER — Inpatient Hospital Stay: Admission: RE | Admit: 2015-04-24 | Payer: Medicare Other | Source: Ambulatory Visit

## 2015-04-24 DIAGNOSIS — G8918 Other acute postprocedural pain: Secondary | ICD-10-CM | POA: Diagnosis not present

## 2015-04-24 DIAGNOSIS — Z79899 Other long term (current) drug therapy: Secondary | ICD-10-CM | POA: Diagnosis not present

## 2015-04-24 DIAGNOSIS — Z171 Estrogen receptor negative status [ER-]: Secondary | ICD-10-CM | POA: Insufficient documentation

## 2015-04-24 DIAGNOSIS — Z79890 Hormone replacement therapy (postmenopausal): Secondary | ICD-10-CM | POA: Diagnosis not present

## 2015-04-24 DIAGNOSIS — G40909 Epilepsy, unspecified, not intractable, without status epilepticus: Secondary | ICD-10-CM | POA: Diagnosis not present

## 2015-04-24 DIAGNOSIS — C50211 Malignant neoplasm of upper-inner quadrant of right female breast: Secondary | ICD-10-CM

## 2015-04-24 DIAGNOSIS — R079 Chest pain, unspecified: Secondary | ICD-10-CM | POA: Diagnosis not present

## 2015-04-24 DIAGNOSIS — C50411 Malignant neoplasm of upper-outer quadrant of right female breast: Secondary | ICD-10-CM | POA: Insufficient documentation

## 2015-04-24 DIAGNOSIS — E079 Disorder of thyroid, unspecified: Secondary | ICD-10-CM | POA: Diagnosis not present

## 2015-04-24 DIAGNOSIS — K219 Gastro-esophageal reflux disease without esophagitis: Secondary | ICD-10-CM | POA: Diagnosis not present

## 2015-04-24 DIAGNOSIS — C50911 Malignant neoplasm of unspecified site of right female breast: Secondary | ICD-10-CM | POA: Diagnosis present

## 2015-04-24 DIAGNOSIS — N649 Disorder of breast, unspecified: Secondary | ICD-10-CM | POA: Diagnosis present

## 2015-04-24 HISTORY — PX: BREAST LUMPECTOMY WITH SENTINEL LYMPH NODE BIOPSY: SHX5597

## 2015-04-24 HISTORY — DX: Malignant (primary) neoplasm, unspecified: C80.1

## 2015-04-24 LAB — CBC
HCT: 29.2 % — ABNORMAL LOW (ref 36.0–46.0)
Hemoglobin: 9.9 g/dL — ABNORMAL LOW (ref 12.0–15.0)
MCH: 31.1 pg (ref 26.0–34.0)
MCHC: 33.9 g/dL (ref 30.0–36.0)
MCV: 91.8 fL (ref 78.0–100.0)
PLATELETS: 161 10*3/uL (ref 150–400)
RBC: 3.18 MIL/uL — ABNORMAL LOW (ref 3.87–5.11)
RDW: 12.6 % (ref 11.5–15.5)
WBC: 3 10*3/uL — AB (ref 4.0–10.5)

## 2015-04-24 LAB — COMPREHENSIVE METABOLIC PANEL
ALBUMIN: 2.7 g/dL — AB (ref 3.5–5.0)
ALT: 21 U/L (ref 14–54)
ANION GAP: 11 (ref 5–15)
AST: 21 U/L (ref 15–41)
Alkaline Phosphatase: 60 U/L (ref 38–126)
BUN: 10 mg/dL (ref 6–20)
CHLORIDE: 107 mmol/L (ref 101–111)
CO2: 21 mmol/L — ABNORMAL LOW (ref 22–32)
Calcium: 8.6 mg/dL — ABNORMAL LOW (ref 8.9–10.3)
Creatinine, Ser: 0.59 mg/dL (ref 0.44–1.00)
GFR calc Af Amer: >60 mL/min (ref >60)
GLUCOSE: 87 mg/dL (ref 65–99)
POTASSIUM: 4.1 mmol/L (ref 3.5–5.1)
Sodium: 139 mmol/L (ref 135–145)
TOTAL PROTEIN: 4.3 g/dL — AB (ref 6.5–8.1)
Total Bilirubin: 0.2 mg/dL — ABNORMAL LOW (ref 0.3–1.2)

## 2015-04-24 SURGERY — BREAST LUMPECTOMY WITH SENTINEL LYMPH NODE BX
Anesthesia: General | Site: Breast | Laterality: Right

## 2015-04-24 MED ORDER — GLYCOPYRROLATE 0.2 MG/ML IJ SOLN: 0.2000 mg | Freq: Once | INTRAMUSCULAR | Status: DC | PRN

## 2015-04-24 MED ORDER — HYDROMORPHONE HCL 1 MG/ML IJ SOLN
0.2500 mg | INTRAMUSCULAR | Status: DC | PRN
  Administered 2015-04-24 (×4): 0.5 mg via INTRAVENOUS

## 2015-04-24 MED ORDER — SCOPOLAMINE 1 MG/3DAYS TD PT72
MEDICATED_PATCH | TRANSDERMAL | Status: AC
  Filled 2015-04-24: qty 1

## 2015-04-24 MED ORDER — SCOPOLAMINE 1 MG/3DAYS TD PT72
1.0000 | MEDICATED_PATCH | Freq: Once | TRANSDERMAL | Status: DC
  Administered 2015-04-24: 1.5 mg via TRANSDERMAL

## 2015-04-24 MED ORDER — 0.9 % SODIUM CHLORIDE (POUR BTL) OPTIME
TOPICAL | Status: DC | PRN
  Administered 2015-04-24: 400 mL

## 2015-04-24 MED ORDER — THYROID 120 MG PO TABS: 120.0000 mg | ORAL_TABLET | Freq: Every day | ORAL | Status: DC

## 2015-04-24 MED ORDER — HYDROCODONE-ACETAMINOPHEN 5-325 MG PO TABS: 1.0000 | ORAL_TABLET | ORAL | Status: DC | PRN

## 2015-04-24 MED ORDER — CEFAZOLIN SODIUM-DEXTROSE 2-3 GM-% IV SOLR
2.0000 g | INTRAVENOUS | Status: AC
  Administered 2015-04-24: 2 g via INTRAVENOUS

## 2015-04-24 MED ORDER — LACTATED RINGERS IV SOLN: INTRAVENOUS | Status: DC

## 2015-04-24 MED ORDER — LACTATED RINGERS IV SOLN
INTRAVENOUS | Status: DC
  Administered 2015-04-24 (×3): via INTRAVENOUS

## 2015-04-24 MED ORDER — KETOROLAC TROMETHAMINE 30 MG/ML IJ SOLN
INTRAMUSCULAR | Status: AC
  Filled 2015-04-24: qty 1

## 2015-04-24 MED ORDER — DEXAMETHASONE SODIUM PHOSPHATE 4 MG/ML IJ SOLN
INTRAMUSCULAR | Status: DC | PRN
  Administered 2015-04-24: 10 mg via INTRAVENOUS

## 2015-04-24 MED ORDER — ONDANSETRON HCL 4 MG/2ML IJ SOLN
INTRAMUSCULAR | Status: AC
  Filled 2015-04-24: qty 2

## 2015-04-24 MED ORDER — HYDROMORPHONE HCL 1 MG/ML IJ SOLN
INTRAMUSCULAR | Status: AC
  Filled 2015-04-24: qty 1

## 2015-04-24 MED ORDER — PROPOFOL 10 MG/ML IV BOLUS
INTRAVENOUS | Status: DC | PRN
  Administered 2015-04-24: 150 mg via INTRAVENOUS

## 2015-04-24 MED ORDER — FENTANYL CITRATE (PF) 100 MCG/2ML IJ SOLN
INTRAMUSCULAR | Status: AC
  Filled 2015-04-24: qty 2

## 2015-04-24 MED ORDER — OXYCODONE HCL 5 MG PO TABS: 5.0000 mg | ORAL_TABLET | Freq: Once | ORAL | Status: DC | PRN

## 2015-04-24 MED ORDER — DEXAMETHASONE SODIUM PHOSPHATE 10 MG/ML IJ SOLN
INTRAMUSCULAR | Status: AC
  Filled 2015-04-24: qty 1

## 2015-04-24 MED ORDER — METHYLENE BLUE 1 % INJ SOLN
INTRAMUSCULAR | Status: DC | PRN
  Administered 2015-04-24: 1 mL via INTRADERMAL

## 2015-04-24 MED ORDER — SCOPOLAMINE 1 MG/3DAYS TD PT72: 1.0000 | MEDICATED_PATCH | Freq: Once | TRANSDERMAL | Status: DC | PRN

## 2015-04-24 MED ORDER — SODIUM CHLORIDE 0.9 % IJ SOLN
INTRAMUSCULAR | Status: DC | PRN
  Administered 2015-04-24: 4 mL via INTRAVENOUS

## 2015-04-24 MED ORDER — MIDAZOLAM HCL 2 MG/2ML IJ SOLN
INTRAMUSCULAR | Status: AC
  Filled 2015-04-24: qty 2

## 2015-04-24 MED ORDER — FENTANYL CITRATE (PF) 100 MCG/2ML IJ SOLN
INTRAMUSCULAR | Status: AC
Start: 2015-04-24 — End: 2015-04-24
  Filled 2015-04-24: qty 2

## 2015-04-24 MED ORDER — MIDAZOLAM HCL 2 MG/2ML IJ SOLN
1.0000 mg | INTRAMUSCULAR | Status: DC | PRN
  Administered 2015-04-24 (×3): 1 mg via INTRAVENOUS

## 2015-04-24 MED ORDER — ONDANSETRON HCL 4 MG/2ML IJ SOLN
INTRAMUSCULAR | Status: DC | PRN
  Administered 2015-04-24: 4 mg via INTRAVENOUS

## 2015-04-24 MED ORDER — FENTANYL CITRATE (PF) 100 MCG/2ML IJ SOLN
50.0000 ug | INTRAMUSCULAR | Status: AC | PRN
  Administered 2015-04-24 (×2): 50 ug via INTRAVENOUS
  Administered 2015-04-24: 100 ug via INTRAVENOUS

## 2015-04-24 MED ORDER — TECHNETIUM TC 99M SULFUR COLLOID FILTERED
1.0000 | Freq: Once | INTRAVENOUS | Status: AC | PRN
  Administered 2015-04-24: 1 via INTRADERMAL

## 2015-04-24 MED ORDER — KETOROLAC TROMETHAMINE 30 MG/ML IJ SOLN
30.0000 mg | Freq: Once | INTRAMUSCULAR | Status: AC
  Administered 2015-04-24: 30 mg via INTRAVENOUS

## 2015-04-24 MED ORDER — MEPERIDINE HCL 25 MG/ML IJ SOLN: 6.2500 mg | INTRAMUSCULAR | Status: DC | PRN

## 2015-04-24 MED ORDER — BUPIVACAINE HCL (PF) 0.25 % IJ SOLN
INTRAMUSCULAR | Status: DC | PRN
  Administered 2015-04-24: 7 mL

## 2015-04-24 MED ORDER — LIDOCAINE HCL (CARDIAC) 20 MG/ML IV SOLN
INTRAVENOUS | Status: DC | PRN
  Administered 2015-04-24: 60 mg via INTRAVENOUS

## 2015-04-24 MED ORDER — LIDOCAINE-EPINEPHRINE (PF) 1 %-1:200000 IJ SOLN
INTRAMUSCULAR | Status: DC | PRN
  Administered 2015-04-24: 7 mL

## 2015-04-24 MED ORDER — LIDOCAINE HCL (CARDIAC) 20 MG/ML IV SOLN
INTRAVENOUS | Status: AC
  Filled 2015-04-24: qty 5

## 2015-04-24 MED ORDER — BUPIVACAINE-EPINEPHRINE (PF) 0.5% -1:200000 IJ SOLN
INTRAMUSCULAR | Status: DC | PRN
  Administered 2015-04-24: 25 mL via PERINEURAL

## 2015-04-24 MED ORDER — SODIUM CHLORIDE 0.9 % IJ SOLN
INTRAMUSCULAR | Status: AC
  Filled 2015-04-24: qty 10

## 2015-04-24 MED ORDER — ZOLPIDEM TARTRATE 5 MG PO TABS: 5.0000 mg | ORAL_TABLET | Freq: Every day | ORAL | Status: DC

## 2015-04-24 MED ORDER — OXYCODONE HCL 5 MG/5ML PO SOLN: 5.0000 mg | Freq: Once | ORAL | Status: DC | PRN

## 2015-04-24 MED ORDER — CEFAZOLIN SODIUM-DEXTROSE 2-3 GM-% IV SOLR
INTRAVENOUS | Status: AC
  Filled 2015-04-24: qty 50

## 2015-04-24 SURGICAL SUPPLY — 59 items
ADH SKN CLS APL DERMABOND .7 (GAUZE/BANDAGES/DRESSINGS) ×1
BINDER BREAST LRG (GAUZE/BANDAGES/DRESSINGS) IMPLANT
BINDER BREAST MEDIUM (GAUZE/BANDAGES/DRESSINGS) ×2 IMPLANT
BINDER BREAST XLRG (GAUZE/BANDAGES/DRESSINGS) IMPLANT
BINDER BREAST XXLRG (GAUZE/BANDAGES/DRESSINGS) IMPLANT
BLADE HEX COATED 2.75 (ELECTRODE) ×2 IMPLANT
BLADE SURG 10 STRL SS (BLADE) ×2 IMPLANT
BLADE SURG 15 STRL LF DISP TIS (BLADE) ×1 IMPLANT
BLADE SURG 15 STRL SS (BLADE) ×2
BNDG GAUZE ELAST 4 BULKY (GAUZE/BANDAGES/DRESSINGS) ×2 IMPLANT
CANISTER SUCT 1200ML W/VALVE (MISCELLANEOUS) ×2 IMPLANT
CHLORAPREP W/TINT 26ML (MISCELLANEOUS) ×2 IMPLANT
CLIP TI LARGE 6 (CLIP) ×2 IMPLANT
CLIP TI MEDIUM 6 (CLIP) ×3 IMPLANT
CLIP TI WIDE RED SMALL 6 (CLIP) IMPLANT
COVER MAYO STAND STRL (DRAPES) ×2 IMPLANT
COVER PROBE W GEL 5X96 (DRAPES) ×2 IMPLANT
DECANTER SPIKE VIAL GLASS SM (MISCELLANEOUS) IMPLANT
DERMABOND ADVANCED (GAUZE/BANDAGES/DRESSINGS) ×1
DERMABOND ADVANCED .7 DNX12 (GAUZE/BANDAGES/DRESSINGS) ×1 IMPLANT
DEVICE DUBIN W/COMP PLATE 8390 (MISCELLANEOUS) IMPLANT
DRAPE UTILITY XL STRL (DRAPES) ×2 IMPLANT
DRSG PAD ABDOMINAL 8X10 ST (GAUZE/BANDAGES/DRESSINGS) ×2 IMPLANT
ELECT REM PT RETURN 9FT ADLT (ELECTROSURGICAL) ×2
ELECTRODE REM PT RTRN 9FT ADLT (ELECTROSURGICAL) ×1 IMPLANT
GAUZE SPONGE 4X4 12PLY STRL (GAUZE/BANDAGES/DRESSINGS) ×4 IMPLANT
GLOVE BIO SURGEON STRL SZ 6 (GLOVE) ×2 IMPLANT
GLOVE BIOGEL PI IND STRL 6.5 (GLOVE) ×1 IMPLANT
GLOVE BIOGEL PI INDICATOR 6.5 (GLOVE) ×1
GOWN STRL REUS W/ TWL LRG LVL3 (GOWN DISPOSABLE) ×1 IMPLANT
GOWN STRL REUS W/TWL 2XL LVL3 (GOWN DISPOSABLE) ×2 IMPLANT
GOWN STRL REUS W/TWL LRG LVL3 (GOWN DISPOSABLE) ×2
ILLUMINATOR WAVEGUIDE N/F (MISCELLANEOUS) IMPLANT
KIT MARKER MARGIN INK (KITS) IMPLANT
LIGHT WAVEGUIDE WIDE FLAT (MISCELLANEOUS) ×2 IMPLANT
NDL SAFETY ECLIPSE 18X1.5 (NEEDLE) ×1 IMPLANT
NEEDLE HYPO 18GX1.5 SHARP (NEEDLE) ×2
NEEDLE HYPO 25X1 1.5 SAFETY (NEEDLE) ×4 IMPLANT
NS IRRIG 1000ML POUR BTL (IV SOLUTION) ×2 IMPLANT
PACK BASIN DAY SURGERY FS (CUSTOM PROCEDURE TRAY) ×2 IMPLANT
PACK UNIVERSAL I (CUSTOM PROCEDURE TRAY) ×2 IMPLANT
PENCIL BUTTON HOLSTER BLD 10FT (ELECTRODE) ×2 IMPLANT
SLEEVE SCD COMPRESS KNEE MED (MISCELLANEOUS) IMPLANT
SPONGE LAP 18X18 X RAY DECT (DISPOSABLE) ×2 IMPLANT
SPONGE LAP 4X18 X RAY DECT (DISPOSABLE) IMPLANT
STOCKINETTE IMPERVIOUS LG (DRAPES) ×2 IMPLANT
STRIP CLOSURE SKIN 1/2X4 (GAUZE/BANDAGES/DRESSINGS) ×2 IMPLANT
SUT MON AB 4-0 PC3 18 (SUTURE) ×2 IMPLANT
SUT SILK 2 0 SH (SUTURE) IMPLANT
SUT VIC AB 2-0 SH 27 (SUTURE)
SUT VIC AB 2-0 SH 27XBRD (SUTURE) IMPLANT
SUT VIC AB 3-0 SH 27 (SUTURE) ×4
SUT VIC AB 3-0 SH 27X BRD (SUTURE) ×2 IMPLANT
SYR BULB 3OZ (MISCELLANEOUS) IMPLANT
SYR CONTROL 10ML LL (SYRINGE) ×4 IMPLANT
TOWEL OR 17X24 6PK STRL BLUE (TOWEL DISPOSABLE) ×2 IMPLANT
TOWEL OR NON WOVEN STRL DISP B (DISPOSABLE) ×2 IMPLANT
TUBE CONNECTING 20X1/4 (TUBING) ×2 IMPLANT
YANKAUER SUCT BULB TIP NO VENT (SUCTIONS) ×2 IMPLANT

## 2015-04-24 NOTE — H&P (Signed)
Virginia [name] 04/01/2015 3:33 PM Location: [facility] Patient #: [id number] DOB: [birth year] Married / Language: Virginia [name] / Race: White Female   History of Present Illness [name] MD; 04/01/2015 4:11 PM) Patient words: Right breast cancer.  The patient is a 65 year old female who presents with breast cancer. The patient is a 65 year old female who is referred for consultation by Dr. Sharol Roussel for a new right breast cancer. The patient has had a biopsy in this exact location in 2010. She noted a change in her right breast contour in the mirror. She brought this to the attention of her primary care physician who sent her for imaging. Imaging was concerning for a 1.1 cm angulated firm mass at the 12:30 location. A biopsy was recommended and was performed 3 days ago. This demonstrated a new grade 1 invasive ductal carcinoma. The prognostic profile has not been completed yet and is pending.  She has no family history of malignancy. Menarche was at age 6. She is a G2 P2 with her first child at age 27-30. She is on disability due to a back injury. She is status post back surgery and has chronic sciatica.   Other Problems Virginia [name], [place]; 04/01/2015 3:35 PM) Anxiety Disorder Arthritis Back Pain Breast Cancer Depression General anesthesia - complications Lump In Breast Other disease, cancer, significant illness Seizure Disorder Thyroid Disease  Past Surgical History Virginia [name], [place]; 04/01/2015 3:35 PM) Appendectomy Cesarean Section - 1 Spinal Surgery - Lower Back  Diagnostic Studies History Virginia [name], CMA; 04/01/2015 3:35 PM) Colonoscopy >10 years ago Mammogram within last year Pap Smear 1-5 years ago  Allergies Virginia [name], CMA; 04/01/2015 3:37 PM) Phenergan *ANTIHISTAMINES* Nausea, Vomiting.  Medication History Virginia [name], CMA; 04/01/2015 3:55 PM) Replens (Vaginal qhs) Active. Fish Oil-Cholecalciferol (Oral daily)  Active. Vitamin D3 (2000UNIT Tablet, Oral daily) Active. Vitamin C (5000 mg Oral bid) Active. Vitamin A (25000UNIT Capsule, 4 on Sundays and 2 eod Oral) Active. Ambien (10MG  Tablet, 1/2 tablet Oral bedtime) Active. Calcium 600 (1500 (600 Ca)MG Tablet, Oral daily) Active. Armour Thyroid (120MG  Tablet, Oral daily) Active. Digestive Enzymes (3 per meal Oral) Active. Potassium (99MG  Tablet, 2-3 q wk Oral) Active. Melatonin (3MG  Tablet, 1/2 tablet Oral qhs) Active. Progesterone (1/4 tsp qhs) Active. Estriol Active. Medications Reconciled Magnesium Glycinate Active. Magnesium Glycinate Plus (110MG  Capsule, 6 tablets Oral daily) Active. Collagen Hydrolysate (1 tbsp daily) Active. Choline Citrate (650MG  Tablet, Oral) Active. (Pt takes in juice form. 1/2 tsp w/ magnesium)  Social History Virginia [name], [place]; 04/01/2015 3:35 PM) Alcohol use Occasional alcohol use. Caffeine use Coffee, Tea. Illicit drug use Remotely quit drug use. Tobacco use Never smoker.  Family History Virginia [name], [place]; 04/01/2015 3:35 PM) Alcohol Abuse Brother, Daughter, Father, Sister, Son. Arthritis Father, Mother. Depression Father. Heart Disease Father. Heart disease in female family member before age 52 Hypertension Brother, Daughter, Father, Sister. Migraine Headache Daughter. Respiratory Condition Father.  Pregnancy / Birth History Virginia [name], [place]; 04/01/2015 3:35 PM) Age at menarche 83 years. Age of menopause 66-50 Contraceptive History Oral contraceptives. Gravida 2 Maternal age 54-30 Para 2    Review of Systems ([place]; 04/01/2015 3:35 PM) General Present- Fatigue and Weight Loss. Not Present- Appetite Loss, Chills, Fever, Night Sweats and Weight Gain. Skin Present- Dryness. Not Present- Change in Wart/Mole, Hives, Jaundice, New Lesions, Non-Healing Wounds, Rash and Ulcer. HEENT Present- Seasonal Allergies, Visual Disturbances and Wears glasses/contact  lenses. Not Present- Earache, Hearing Loss, Hoarseness, Nose Bleed, Oral Ulcers, Ringing in the  Ears, Sinus Pain, Sore Throat and Yellow Eyes. Respiratory Not Present- Bloody sputum, Chronic Cough, Difficulty Breathing, Snoring and Wheezing. Breast Present- Breast Mass and Breast Pain. Not Present- Nipple Discharge and Skin Changes. Cardiovascular Present- Leg Cramps. Not Present- Chest Pain, Difficulty Breathing Lying Down, Palpitations, Rapid Heart Rate, Shortness of Breath and Swelling of Extremities. Gastrointestinal Present- Bloating and Excessive gas. Not Present- Abdominal Pain, Bloody Stool, Change in Bowel Habits, Chronic diarrhea, Constipation, Difficulty Swallowing, Gets full quickly at meals, Hemorrhoids, Indigestion, Nausea, Rectal Pain and Vomiting. Female Genitourinary Present- Frequency. Not Present- Nocturia, Painful Urination, Pelvic Pain and Urgency. Musculoskeletal Present- Back Pain, Joint Pain and Muscle Weakness. Not Present- Joint Stiffness, Muscle Pain and Swelling of Extremities. Neurological Present- Numbness. Not Present- Decreased Memory, Fainting, Headaches, Seizures, Tingling, Tremor, Trouble walking and Weakness. Psychiatric Present- Anxiety and Change in Sleep Pattern. Not Present- Bipolar, Depression, Fearful and Frequent crying. Endocrine Present- Cold Intolerance. Not Present- Excessive Hunger, Hair Changes, Heat Intolerance, Hot flashes and New Diabetes. Hematology Present- Gland problems. Not Present- Easy Bruising, Excessive bleeding, HIV and Persistent Infections.  Vitals Virginia [name] CMA; 04/01/2015 4:03 PM) 04/01/2015 3:55 PM Weight: 121 lb Height: 65in Body Surface Area: 1.6 m Body Mass Index: 20.14 kg/m  Temp.: 98.22F(Oral)  Pulse: 72 (Regular)  BP: 110/70 (Sitting, Right Arm, Standard)       Physical Exam [name] MD; 04/01/2015 4:12 PM) General Mental Status-Alert. General Appearance-Consistent with stated  age. Hydration-Well hydrated. Voice-Normal. Note: Very thin. Walks with a cane.   Head and Neck Head-normocephalic, atraumatic with no lesions or palpable masses. Trachea-midline. Thyroid Gland Characteristics - normal size and consistency.  Eye Eyeball - Bilateral-Extraocular movements intact. Sclera/Conjunctiva - Bilateral-No scleral icterus.  Chest and Lung Exam Chest and lung exam reveals -quiet, even and easy respiratory effort with no use of accessory muscles and on auscultation, normal breath sounds, no adventitious sounds and normal vocal resonance. Inspection Chest Wall - Normal. Back - normal.  Breast Note: There is a 1-1.5 cm mass at 12:00 in the right breast. This is mobile. It is also tender from the biopsy. There is some resolving bruising at the biopsy site. She has no other palpable masses, no nipple retraction or nipple discharge, no skin dimpling, and no lymphadenopathy. The left breast is symmetric and has no positive findings.   Cardiovascular Cardiovascular examination reveals -normal heart sounds, regular rate and rhythm with no murmurs and normal pedal pulses bilaterally.  Abdomen Inspection Inspection of the abdomen reveals - No Hernias. Palpation/Percussion Palpation and Percussion of the abdomen reveal - Soft, Non Tender, No Rebound tenderness, No Rigidity (guarding) and No hepatosplenomegaly. Auscultation Auscultation of the abdomen reveals - Bowel sounds normal.  Neurologic Neurologic evaluation reveals -alert and oriented x 3 with no impairment of recent or remote memory. Mental Status-Normal.  Musculoskeletal Global Assessment -Note: no gross deformities.  Normal Exam - Left-Upper Extremity Strength Normal and Lower Extremity Strength Normal. Normal Exam - Right-Upper Extremity Strength Normal and Lower Extremity Strength Normal.  Lymphatic Head & Neck  General Head & Neck Lymphatics: Bilateral - Description -  Normal. Axillary  General Axillary Region: Bilateral - Description - Normal. Tenderness - Non Tender. Femoral & Inguinal  Generalized Femoral & Inguinal Lymphatics: Bilateral - Description - No Generalized lymphadenopathy.    Assessment & Plan [name] MD; 04/01/2015 4:14 PM) BREAST CANCER OF UPPER-INNER QUADRANT OF RIGHT FEMALE BREAST (C50.211) Impression: Patient has a new diagnosis of a clinical T1c and 0 right breast cancer. She will need  a referral to radiation oncology and medical oncology for completing her treatment plan. We also will need her prognostic profile. This does appear to be a slow-growing tumor since it was biopsied 6-1/2 years ago and was negative at that point. On her recent biopsy, the previous clip came out with the biopsy needle.  She does desire breast conservation surgery. We will plan to do a lumpectomy with sentinel lymph node biopsy. Because this lesion is palpable, I will not need to do a seed or wire localization.  I reviewed the surgery as well as the surgical plan. I reviewed the risks including the need for additional surgery, risk of recurrent cancer, bleeding, infection, fluid buildup, pain or other and just unanticipated problems.  She agrees to proceed.  73 The surgical procedure was described to the patient. I discussed the incision type and location and that we would need radiology involved on with a wire or seed marker and/or sentinel node.  The risks and benefits of the procedure were described to the patient and she wishes to proceed.  We discussed the risks bleeding, infection, damage to other structures, need for further procedures/surgeries. We discussed the risk of seroma. The patient was advised if the area in the breast in cancer, we may need to go back to surgery for additional tissue to obtain negative margins or for a lymph node biopsy. The patient was advised that these are the most common complications, but that others can occur as  well. They were advised against taking aspirin or other anti-inflammatory agents/blood thinners the week before surgery. Current Plans You are being scheduled for surgery - Our schedulers will call you.  You should hear from our office's scheduling department within 5 working days about the location, date, and time of surgery. We try to make accommodations for patient's preferences in scheduling surgery, but sometimes the OR schedule or the surgeon's schedule prevents [place] from making those accommodations.  If you have not heard from our office [phone]) in 5 working days, call the office and ask for your surgeon's nurse.  If you have other questions about your diagnosis, plan, or surgery, call the office and ask for your surgeon's nurse.  Pt Education - flb breast cancer surgery: discussed with patient and provided information. Referred to Radiation Oncology, for evaluation and follow up (Radiation Oncology). Routine. Referred to Oncology, for evaluation and follow up (Oncology). Routine.   Signed by [name], MD (04/01/2015 4:14 PM)

## 2015-04-24 NOTE — Anesthesia Procedure Notes (Addendum)
Anesthesia Regional Block:  Pectoralis block  Pre-Anesthetic Checklist: ,, timeout performed, Correct Patient, Correct Site, Correct Laterality, Correct Procedure, Correct Position, site marked, Risks and benefits discussed,  Surgical consent,  Pre-op evaluation,  At surgeon's request and post-op pain management  Laterality: Right and Upper  Prep: chloraprep       Needles:  Injection technique: Single-shot  Needle Type: Echogenic Needle     Needle Length: 9cm 9 cm Needle Gauge: 21 and 21 G    Additional Needles:  Procedures: ultrasound guided (picture in chart) Pectoralis block Narrative:  Start time: 04/24/2015 1:36 PM End time: 04/24/2015 1:41 PM Injection made incrementally with aspirations every 5 mL.  Performed by: Personally  Anesthesiologist: CREWS, DAVID   Procedure Name: LMA Insertion Performed by: Lyndee Leo Pre-anesthesia Checklist: Patient identified, Emergency Drugs available, Suction available and Patient being monitored Patient Re-evaluated:Patient Re-evaluated prior to inductionOxygen Delivery Method: Circle System Utilized Preoxygenation: Pre-oxygenation with 100% oxygen Intubation Type: IV induction Ventilation: Mask ventilation without difficulty LMA: LMA inserted LMA Size: 4.0 Number of attempts: 1 Airway Equipment and Method: Bite block Placement Confirmation: positive ETCO2 Tube secured with: Tape Dental Injury: Teeth and Oropharynx as per pre-operative assessment       Right PEC block image

## 2015-04-24 NOTE — Anesthesia Postprocedure Evaluation (Signed)
Anesthesia Post Note  Patient: Virginia [name]  Procedure(s) Performed: Procedure(s) (LRB): RIGHT BREAST LUMPECTOMY WITH SENTINEL LYMPH NODE BIOPSY (Right)  Patient location during evaluation: PACU Anesthesia Type: General Level of consciousness: awake and alert Pain management: pain level controlled Vital Signs Assessment: post-procedure vital signs reviewed and stable Respiratory status: spontaneous breathing, nonlabored ventilation and respiratory function stable Cardiovascular status: blood pressure returned to baseline and stable Postop Assessment: no signs of nausea or vomiting Anesthetic complications: no    Last Vitals:  Filed Vitals:   04/24/15 1615 04/24/15 1630  BP: 116/75 126/86  Pulse: 67 59  Temp:    Resp: 24 24    Last Pain:  Filed Vitals:   04/24/15 1643  PainSc: 5                  Pearla Mckinny A

## 2015-04-24 NOTE — Anesthesia Preprocedure Evaluation (Signed)
Anesthesia Evaluation  Patient identified by MRN, date of birth, ID band Patient awake    Reviewed: Allergy & Precautions, NPO status , Patient's Chart, lab work & pertinent test results  Airway Mallampati: I  TM Distance: >3 FB Neck ROM: Full    Dental  (+) Teeth Intact, Dental Advisory Given   Pulmonary  breath sounds clear to auscultation        Cardiovascular Rhythm:Regular Rate:Normal     Neuro/Psych    GI/Hepatic GERD-  Medicated and Controlled,  Endo/Other    Renal/GU      Musculoskeletal   Abdominal   Peds  Hematology   Anesthesia Other Findings   Reproductive/Obstetrics                             Anesthesia Physical Anesthesia Plan  ASA: II  Anesthesia Plan: General   Post-op Pain Management: MAC Combined w/ Regional for Post-op pain   Induction: Intravenous  Airway Management Planned: LMA  Additional Equipment:   Intra-op Plan:   Post-operative Plan: Extubation in OR  Informed Consent: I have reviewed the patients History and Physical, chart, labs and discussed the procedure including the risks, benefits and alternatives for the proposed anesthesia with the patient or authorized representative who has indicated his/her understanding and acceptance.   Dental advisory given  Plan Discussed with: CRNA, Anesthesiologist and Surgeon  Anesthesia Plan Comments:         Anesthesia Quick Evaluation  

## 2015-04-24 NOTE — Transfer of Care (Signed)
Immediate Anesthesia Transfer of Care Note  Patient: Virginia [name]  Procedure(s) Performed: Procedure(s): RIGHT BREAST LUMPECTOMY WITH SENTINEL LYMPH NODE BIOPSY (Right)  Patient Location: PACU  Anesthesia Type:GA combined with regional for post-op pain  Level of Consciousness: awake, sedated and patient cooperative  Airway & Oxygen Therapy: Patient Spontanous Breathing and Patient connected to face mask oxygen  Post-op Assessment: Report given to RN and Post -op Vital signs reviewed and stable  Post vital signs: Reviewed and stable  Last Vitals:  Filed Vitals:   04/24/15 1400 04/24/15 1415  BP: 104/57 112/66  Pulse: 59 74  Temp:    Resp: 15 18    Complications: No apparent anesthesia complications

## 2015-04-24 NOTE — Interval H&P Note (Signed)
History and Physical Interval Note:  04/24/2015 12:56 PM  Virginia [name]  has presented today for surgery, with the diagnosis of Right breast cancer   The various methods of treatment have been discussed with the patient and family. After consideration of risks, benefits and other options for treatment, the patient has consented to  Procedure(s): RIGHT BREAST LUMPECTOMY WITH SENTINEL LYMPH NODE BX (Right) as a surgical intervention .  The patient's history has been reviewed, patient examined, no change in status, stable for surgery.  I have reviewed the patient's chart and labs.  Questions were answered to the patient's satisfaction.     [name]

## 2015-04-24 NOTE — Progress Notes (Signed)
Emotional support during breast injections °

## 2015-04-24 NOTE — Progress Notes (Signed)
Assisted Dr. Crews with right, ultrasound guided, pectoralis block. Side rails up, monitors on throughout procedure. See vital signs in flow sheet. Tolerated Procedure well. 

## 2015-04-24 NOTE — Op Note (Signed)
Right Breastlumpectomy and sentinel lymph node biopsy  Indications: This patient presents with history of right breast cancer, [id number]  Pre-operative Diagnosis: See above  Post-operative Diagnosis: Same  Surgeon: [name]   Anesthesia: General endotracheal anesthesia  ASA Class: 2  Procedure Details  The patient was seen in the Holding Room. The risks, benefits, complications, treatment options, and expected outcomes were discussed with the patient. The possibilities of bleeding, infection, the need for additional procedures, failure to diagnose a condition, and creating a complication requiring transfusion or operation were discussed with the patient. The patient concurred with the proposed plan, giving informed consent.  The site of surgery properly noted/marked. The patient was taken to Operating Room # 8, identified, and the procedure verified as Right Breast Lumpectomy with sentinel lymph node biopsy. A Time Out was held and the above information confirmed.  Methylene blue was injected in the subareolar space.    The right arm, breast, and chest were prepped and draped in standard fashion. The lumpectomy was performed by creating an transverse incision over the upper outer quadrant of the breast over the palpable mass.  Dissection was carried down around the mass to the pectoralis fascia.  The cautery was used to perform the dissection.  Hemostasis was achieved with cautery. The edges of the cavity were marked with large clips, with one each medial, lateral, inferior and superior, and two clips posteriorly.   The specimen was inked with the margin marker paint kit.    Specimen radiography confirmed inclusion of the mammographic lesion and the clip.Marland Kitchen    The wound was irrigated and closed with 3-0 vicryl in layers and 4-0 monocryl subcuticular suture.    Using a hand-held gamma probe,  axillary sentinel nodes were identified transcutaneously.  An oblique incision was created below the  axillary hairline.  Dissection was carried through the clavipectoral fascia.  Three level 2 axillary sentinel nodes were removed.  Counts per second were 89, 35, and 12.    The background count was 2 cps.  The wound was irrigated.  Hemostasis was achieved with cautery.  The axillary incision was closed with a 3-0 vicryl deep dermal interrupted sutures and a 4-0 monocryl subcuticular closure.    Sterile dressings were applied. At the end of the operation, all sponge, instrument, and needle counts were correct.  Findings: grossly clear surgical margins and no adenopathy.  Posterior margin is pectoralis.  Anterior margin is skin.  SLN #1 hot and blue, SLN #2 hot, SLN #3 hot.  Estimated Blood Loss:  min         Specimens: right breast lumpectomy and three axillary sentinel lymph nodes.             Complications:  None; patient tolerated the procedure well.         Disposition: PACU - hemodynamically stable.         Condition: stable

## 2015-04-24 NOTE — Discharge Instructions (Addendum)
[facility] °Office Phone Number [phone] ° °BREAST BIOPSY/ PARTIAL MASTECTOMY: POST OP INSTRUCTIONS ° °Always review your discharge instruction sheet given to you by the facility where your surgery was performed. ° °IF YOU HAVE DISABILITY OR FAMILY LEAVE FORMS, YOU MUST BRING THEM TO THE OFFICE FOR PROCESSING.  DO NOT GIVE THEM TO YOUR DOCTOR. ° °1. A prescription for pain medication may be given to you upon discharge.  Take your pain medication as prescribed, if needed.  If narcotic pain medicine is not needed, then you may take acetaminophen (Tylenol) or ibuprofen (Advil) as needed. °2. Take your usually prescribed medications unless otherwise directed °3. If you need a refill on your pain medication, please contact your pharmacy.  They will contact our office to request authorization.  Prescriptions will not be filled after 5pm or on week-ends. °4. You should eat very light the first 24 hours after surgery, such as soup, crackers, pudding, etc.  Resume your normal diet the day after surgery. °5. Most patients will experience some swelling and bruising in the breast.  Ice packs and a good support bra will help.  Swelling and bruising can take several days to resolve.  °6. It is common to experience some constipation if taking pain medication after surgery.  Increasing fluid intake and taking a stool softener will usually help or prevent this problem from occurring.  A mild laxative (Milk of Magnesia or Miralax) should be taken according to package directions if there are no bowel movements after 48 hours. °7. Unless discharge instructions indicate otherwise, you may remove your bandages 48 hours after surgery, and you may shower at that time.  You may have steri-strips (small skin tapes) in place directly over the incision.  These strips should be left on the skin for 7-10 days.   Any sutures or staples will be removed at the office during your follow-up visit. °8. ACTIVITIES:  You may resume  regular daily activities (gradually increasing) beginning the next day.  Wearing a good support bra or sports bra (or the breast binder) minimizes pain and swelling.  You may have sexual intercourse when it is comfortable. °a. You may drive when you no longer are taking prescription pain medication, you can comfortably wear a seatbelt, and you can safely maneuver your car and apply brakes. °b. RETURN TO WORK:  __________1 week_______________ °9. You should see your doctor in the office for a follow-up appointment approximately two weeks after your surgery.  Your doctor’s nurse will typically make your follow-up appointment when she calls you with your pathology report.  Expect your pathology report 2-3 business days after your surgery.  You may call to check if you do not hear from us after three days. ° ° °WHEN TO CALL YOUR DOCTOR: °1. Fever over 101.0 °2. Nausea and/or vomiting. °3. Extreme swelling or bruising. °4. Continued bleeding from incision. °5. Increased pain, redness, or drainage from the incision. ° °The clinic staff is available to answer your questions during regular business hours.  Please don’t hesitate to call and ask to speak to one of the nurses for clinical concerns.  If you have a medical emergency, go to the nearest emergency room or call 911.  A surgeon from [facility] is always on call at the hospital. ° °For further questions, please visit [email or web address]  ° ° °Post Anesthesia Home Care Instructions ° °Activity: °Get plenty of rest for the remainder of the day. A responsible adult should stay with you for 24   hours following the procedure.  °For the next 24 hours, DO NOT: °-Drive a car °-Operate machinery °-Drink alcoholic beverages °-Take any medication unless instructed by your physician °-Make any legal decisions or sign important papers. ° °Meals: °Start with liquid foods such as gelatin or soup. Progress to regular foods as tolerated. Avoid greasy, spicy, heavy  foods. If nausea and/or vomiting occur, drink only clear liquids until the nausea and/or vomiting subsides. Call your physician if vomiting continues. ° °Special Instructions/Symptoms: °Your throat may feel dry or sore from the anesthesia or the breathing tube placed in your throat during surgery. If this causes discomfort, gargle with warm salt water. The discomfort should disappear within 24 hours. ° °If you had a scopolamine patch placed behind your ear for the management of post- operative nausea and/or vomiting: ° °1. The medication in the patch is effective for 72 hours, after which it should be removed.  Wrap patch in a tissue and discard in the trash. Wash hands thoroughly with soap and water. °2. You may remove the patch earlier than 72 hours if you experience unpleasant side effects which may include dry mouth, dizziness or visual disturbances. °3. Avoid touching the patch. Wash your hands with soap and water after contact with the patch. °  ° °

## 2015-04-25 ENCOUNTER — Encounter (HOSPITAL_BASED_OUTPATIENT_CLINIC_OR_DEPARTMENT_OTHER): Payer: Self-pay | Admitting: General Surgery

## 2015-04-25 NOTE — Progress Notes (Signed)
Pt much better after going home and taking her homeopathic remedies. Able to tolerate po intake and voided multiple times. Pt's husband states pt slept well during night.

## 2015-04-26 ENCOUNTER — Telehealth: Payer: Self-pay | Admitting: Hematology and Oncology

## 2015-04-26 NOTE — Telephone Encounter (Signed)
Pt called to cancel MD visit due to waiting on surgeon, will c/b to r/s ... KJ

## 2015-04-29 NOTE — Progress Notes (Signed)
Quick Note:  Please let patient know margins are negative and LN are negative! ______ 

## 2015-05-01 ENCOUNTER — Ambulatory Visit: Payer: Medicare Other | Admitting: Hematology and Oncology

## 2015-05-13 ENCOUNTER — Encounter: Payer: Self-pay | Admitting: *Deleted

## 2015-05-14 ENCOUNTER — Telehealth: Payer: Self-pay | Admitting: Hematology and Oncology

## 2015-05-14 DIAGNOSIS — C50911 Malignant neoplasm of unspecified site of right female breast: Secondary | ICD-10-CM | POA: Diagnosis not present

## 2015-05-14 NOTE — Telephone Encounter (Signed)
Left message for patient to inform her of 2/24 appt with Dr. Lindi Adie per 2/20 pof. Advised patient to call the office if the appt date/time does not work to r/s

## 2015-05-15 ENCOUNTER — Telehealth: Payer: Self-pay | Admitting: Hematology and Oncology

## 2015-05-15 NOTE — Telephone Encounter (Signed)
pt called to cxd appt on 2/24. She said she will r/s if she decides to use chemo as a treatment

## 2015-05-16 NOTE — Progress Notes (Signed)
Location of Breast Cancer: Right Breast  Histology per Pathology Report:  03/29/15 Diagnosis Breast, right, needle core biopsy, right breast upper outer at 12:30 - INVASIVE DUCTAL CARCINOMA  04/24/15 Diagnosis 1. Breast, lumpectomy, right - INVASIVE DUCTAL CARCINOMA, GRADE 2, SPANNING 1.3 CM. - DUCTAL CARCINOMA IN SITU, HIGH GRADE. - RESECTION MARGINS NEGATIVE FOR INVASIVE CARCINOMA. - DUCTAL CARCINOMA IN SITU COMES TO WITHIN 0.1-0.2 CM OF THE ANTERIOR AND  Diagnosis(continued) POSTERIOR MARGINS FOCALLY. - BIOPSY SITE. - SEE ONCOLOGY TABLE. 2. Lymph node, sentinel, biopsy, right axillary #1 - ONE OF ONE LYMPH NODES NEGATIVE FOR CARCINOMA (0/1). 3. Lymph node, sentinel, biopsy, right axillary #2 - ONE OF ONE LYMPH NODES NEGATIVE FOR CARCINOMA (0/1). 4. Lymph node, sentinel, biopsy, right axillary #3 - ONE OF ONE LYMPH NODES NEGATIVE FOR CARCINOMA (0/1). Microscopic Comment  Receptor Status: ER(NEG), PR (NEG), Her2-neu (POS)  Did patient present with symptoms or was this found on screening mammography?: The patient had a biopsy in this exact location in 2010. She noted a change in her right breast contour in the mirror. She brought this to the attention of her primary care physician who sent her for imaging.   Past/Anticipated interventions by surgeon, if any: 04/24/15 Dr. Barry Dienes Right Breast Lumpectomy and sentinel lymph node biopsy  She has seen Dr. Barry Dienes last week for a follow up, and he felt like she was healing well.    Past/Anticipated interventions by medical oncology, if any: Per Dr. Geralyn Flash note from 04/15/15: Recommendation based a multidisciplinary tumor board: 1. Breast conserving surgery followed by 2. Adjuvant chemotherapy with TCH followed by Herceptin maintenance for 1 year (she has not fully decided on Herceptin yet, per her report) 3. Followed by adjuvant radiation therapy   Lymphedema issues, if any:  No  Pain issues, if any:  No pain at the site, she  just reports "soreness" with the fabric rubbing against her incision when she is moving.   SAFETY ISSUES:  Prior radiation? No  Pacemaker/ICD? No  Possible current pregnancy? No  Is the patient on methotrexate? No  Current Complaints / other details:   No family history of malignancy Menarche at age 86 G2 P2 with her first child at age 41-30 Contraceptive History: Oral contraceptives Age of menopause [age]  BP 119/74 mmHg  Pulse 68  Temp(Src) 98.1 F (36.7 C)  Ht '5\' 5"'  (1.651 m)  Wt 117 lb 9.6 oz (53.343 kg)  BMI 19.57 kg/m2    Kaliq Lege, Stephani Police, RN 05/16/2015,2:33 PM

## 2015-05-17 ENCOUNTER — Ambulatory Visit: Payer: Medicare Other | Admitting: Hematology and Oncology

## 2015-05-21 NOTE — Progress Notes (Signed)
Radiation Oncology         ([phone]) [phone] ________________________________  Initial outpatient Consultation  Name: Virginia [name] MRN: [id number]  Date: 05/22/2015  DOB: 03-Mar-1951  BX:UXYBFXO,VANVBTYOM, MD  [name], MD   REFERRING PHYSICIAN: [name], MD  DIAGNOSIS:    ICD-9-CM ICD-10-CM   1. Breast cancer of upper-outer quadrant of right female breast ([place]) 174.4 C50.411    Stage I T1cN0M0 Right Breast UOQ Invasive Ductal Carcinoma, ERneg / PRneg / Her2+, Grade 2; with High Grade DCIS  HISTORY OF PRESENT ILLNESS::Virginia [name] is a 65 y.o. female who presented with a visible change in her right breast contour.  Mammogram revealed a mass in the right breast. [place] measured it to be 1.1cm. Axilla was clear.  She reports this is a site of a biopsy that occurred in 2010. More recent biopsy at this area showed invasive ductal carcinoma.   She then underwent right lumpectomy and SLN bx on 2-1 by Dr Barry Dienes; a 1.3cm tumor (IDC) with DCIS was removed with characteristics as described above in the diagnosis. Margins were close to in situ disease. All 3 SLNs were negative.   She declined reexcision of her close margins. Chemotherapy with Herceptin was recommended by Dr Lindi Adie but she declined this as well due to concerns re: side effects and her body's ability to metabolize medications. She is planning to pursue complementary/alternative treatments with Dr Deirdre Pippins. She reports a history of significant fatigue and concerns about breathing in germs from other people in public as she reports a weak immune system. She wears a fan around her neck to neutralize the air she breathes.  Lymphedema issues, if any:  No  Pain issues, if any:  No pain at the site, she just reports "soreness" with the fabric rubbing against her incision when she is moving.   SAFETY ISSUES:  Prior radiation? No  Pacemaker/ICD? No  Possible current pregnancy? No  Is the patient on methotrexate? No  Current  Complaints / other details:   No family history of malignancy Menarche at age 72 G2 P2 with her first child at age 65-30 Contraceptive History: Oral contraceptives Age of menopause [age]  PREVIOUS RADIATION THERAPY: No  PAST MEDICAL HISTORY:  has a past medical history of Back pain with radiation; Low back pain with sciatica; Palpitations; Lymph nodes enlarged; Nasal congestion; Sore throat; Cough; Abdominal pain; Abdominal distension; GERD (gastroesophageal reflux disease); Hypothyroidism; Dysrhythmia; Seizures ([place]); Arthritis; and Cancer ([place]).    PAST SURGICAL HISTORY: Past Surgical History  Procedure Laterality Date  . Laparoscopic appendectomy  10/03/2011    Procedure: APPENDECTOMY LAPAROSCOPIC;  Surgeon: Zenovia Jarred, MD;  Location: [facility];  Service: General;  Laterality: N/A;  . Appendectomy  10/03/11  . Cesarean section  09/09/81  . Wisdom tooth extraction  1973 - approximate  . Back surgery      lumbar fusion  . Breast lumpectomy with sentinel lymph node biopsy Right 04/24/2015    Procedure: RIGHT BREAST LUMPECTOMY WITH SENTINEL LYMPH NODE BIOPSY;  Surgeon: [name], MD;  Location: [facility];  Service: General;  Laterality: Right;    FAMILY HISTORY: family history includes Emphysema in her father.  SOCIAL HISTORY:  reports that she has never smoked. She has never used smokeless tobacco. She reports that she drinks alcohol. She reports that she does not use illicit drugs.  ALLERGIES: Antihistamines, diphenhydramine-type; Ofirmev; Phenergan; and Scopolamine  MEDICATIONS:  Current Outpatient Prescriptions  Medication Sig Dispense Refill  . Artificial Tear  Solution (BION TEARS OP) Apply 1 drop to eye 3 (three) times daily as needed. Dry eyes    . Ascorbic Acid (VITAMIN C) POWD Take 2.5 g by mouth 2 (two) times daily.    Marland Kitchen CALCIUM PO Take 1-2 tablets by mouth 2 (two) times daily. Take 1 tablet every morning and take 2 tablets every evening.    .  Cholecalciferol (VITAMIN D PO) Take 4 drops by mouth daily.    . COLLAGEN PO Take by mouth.    . Digestive Enzymes CAPS Take 1 capsule by mouth 3 (three) times daily with meals.    Marland Kitchen MAGNESIUM PO Take 1 tablet by mouth daily.    . Misc Natural Products (ADRENAL PO) Take 1 tablet by mouth daily.    . Multiple Vitamin (MULTIVITAMIN WITH MINERALS) TABS Take 1 tablet by mouth daily.    . Omega-3 Fatty Acids (FISH OIL PO) Take 20 mLs by mouth 2 (two) times daily.    Marland Kitchen OVER THE COUNTER MEDICATION Apply 1 application topically every morning. Source Naturals Progesterone Cream.    . OVER THE COUNTER MEDICATION Take 1 tablet by mouth daily. Pau d'Arco Tablets.    Cecille Amsterdam D Arco 1000 MG CAPS Take by mouth daily.    . Probiotic Product (PROBIOTIC DAILY PO) Take 1 capsule by mouth daily.    . sodium chloride (OCEAN) 0.65 % nasal spray Place 1 spray into the nose daily as needed. For congestion.    Marland Kitchen thyroid (ARMOUR) 120 MG tablet Take 120 mg by mouth daily.    Marland Kitchen Ubiquinol 100 MG CAPS Take by mouth.    Marland Kitchen UNABLE TO FIND Hemagenics 29 mg daily    . UNABLE TO FIND Argentyn 23    . UNABLE TO [place] Salt    . UNABLE TO FIND Med Name: iodine 12.5 mg twice daily    . UNABLE TO FIND Med Name: Alphalipoic Acid 27m once daily.    .Marland KitchenUNABLE TO FIND Med Name: Glutathione, 500 mg once daily.    . Vaginal Lubricant (REPLENS) GEL Place vaginally Nightly.    . vitamin A 25000 UNIT capsule Take 25,000 Units by mouth 2 (two) times daily.    . Zinc 50 MG CAPS Take 50 mg by mouth daily.    .Marland Kitchenzolpidem (AMBIEN) 10 MG tablet Take 5 mg by mouth at bedtime. For sleep.    .Marland KitchenHYDROcodone-acetaminophen (NORCO/VICODIN) 5-325 MG tablet Take 1-2 tablets by mouth every 4 (four) hours as needed for moderate pain or severe pain. (Patient not taking: Reported on 05/22/2015) 30 tablet 0   No current facility-administered medications for this encounter.    REVIEW OF SYSTEMS:  Notable for that above.   PHYSICAL EXAM:  height is 5'  5" (1.651 m) and weight is 117 lb 9.6 oz (53.343 kg). Her temperature is 98.1 F (36.7 C). Her blood pressure is 119/74 and her pulse is 68.   General: Alert and oriented, in no acute distress HEENT: Head is normocephalic. Extraocular movements are intact. Oropharynx is clear. Neck: Neck is supple, no palpable cervical or supraclavicular lymphadenopathy. Heart: Regular in rate and rhythm with no murmurs, rubs, or gallops. Chest: Clear to auscultation bilaterally, with no rhonchi, wheezes, or rales. Abdomen: Soft, nontender, nondistended, with no rigidity or guarding. Extremities: No cyanosis or edema. Lymphatics: see Neck Exam Skin: No concerning lesions. Musculoskeletal: Uses a can to walk moderate distance Neurologic: Cranial nerves II through XII are grossly intact. No obvious focalities. Speech  is fluent. Coordination is intact. Psychiatric: Judgment and insight are intact. Affect is appropriate. Breasts: small breasts; lumpectomy and axillary scars healing well in right breast . No  palpable masses appreciated in the breast  or axilla on the left   ECOG = 1  0 - Asymptomatic (Fully active, able to carry on all predisease activities without restriction)  1 - Symptomatic but completely ambulatory (Restricted in physically strenuous activity but ambulatory and able to carry out work of a light or sedentary nature. For example, light housework, office work)  2 - Symptomatic, <50% in bed during the day (Ambulatory and capable of all self care but unable to carry out any work activities. Up and about more than 50% of waking hours)  3 - Symptomatic, >50% in bed, but not bedbound (Capable of only limited self-care, confined to bed or chair 50% or more of waking hours)  4 - Bedbound (Completely disabled. Cannot carry on any self-care. Totally confined to bed or chair)  5 - Death   Eustace Pen MM, Creech RH, Tormey DC, et al. [phone]). "Toxicity and response criteria of the [facility]". [place] Oncol. 5 (6): 649-55   LABORATORY DATA:  Lab Results  Component Value Date   WBC 3.0* 04/24/2015   HGB 9.9* 04/24/2015   HCT 29.2* 04/24/2015   MCV 91.8 04/24/2015   PLT 161 04/24/2015   CMP     Component Value Date/Time   NA 139 04/24/2015 1600   K 4.1 04/24/2015 1600   CL 107 04/24/2015 1600   CO2 21* 04/24/2015 1600   GLUCOSE 87 04/24/2015 1600   BUN 10 04/24/2015 1600   CREATININE 0.59 04/24/2015 1600   CALCIUM 8.6* 04/24/2015 1600   PROT 4.3* 04/24/2015 1600   ALBUMIN 2.7* 04/24/2015 1600   AST 21 04/24/2015 1600   ALT 21 04/24/2015 1600   ALKPHOS 60 04/24/2015 1600   BILITOT 0.2* 04/24/2015 1600   GFRNONAA >60 04/24/2015 1600   GFRAA >60 04/24/2015 1600         RADIOGRAPHY: Nm Sentinel Node Inj-no Rpt (breast)  04/24/2015  CLINICAL DATA: right breast cancer Sulfur colloid was injected intradermally by the nuclear medicine technologist for breast cancer sentinel node localization.      IMPRESSION/PLAN: Stage I breast cancer, right, ER/PR neg, HER2 +  It was a pleasure meeting the patient today. We discussed the risks, benefits, and side effects of radiotherapy. I recommend radiotherapy to the right breast to reduce her risk of locoregional recurrence by 2/3.  We discussed that radiation would take approximately 4 weeks to complete.   If chemotherapy were to be given, this would precede radiotherapy. I talked with her in detail about the aggressive histology of her cancer and the important role that chemotherapy and Herceptin play in curing many patients with breast cancer like her own.  She understands that radiotherapy would provide a locoregional control risk reduction, but only a small improvement in her expected overall survival.  Chemotherapy and Herceptin would stand to offer a much more significant benefit in reducing her risk of death from breast cancer. I urged her to consider meeting with Dr Lindi Adie one more time to discuss her  systemic options.  We spoke about acute effects including skin irritation and fatigue as well as much less common late effects including internal organ injury or irritation. We spoke about the latest technology that is used to minimize the risk of late effects for patients undergoing radiotherapy to the breast  or chest wall. No guarantees of treatment were given.  Consent signed today.  She would like to think about her options. She's not sure yet about whether she will pursue any adjuvant therapy given her concerns above in the HPI.  I gave her the contact info for Dr. Lindi Adie and for me; she knows to call either of [place] when she makes her decision and she knows that I will not schedule anything else at this time.  I also offered to ask the [employer] to pair her with a mentor if she thinks this would be helpful.   __________________________________________   Eppie Gibson, MD

## 2015-05-22 ENCOUNTER — Encounter: Payer: Self-pay | Admitting: Radiation Oncology

## 2015-05-22 ENCOUNTER — Ambulatory Visit
Admission: RE | Admit: 2015-05-22 | Discharge: 2015-05-22 | Disposition: A | Discharge status: Home / Self Care | Payer: Medicare Other | Source: Ambulatory Visit | Attending: Radiation Oncology | Admitting: Radiation Oncology

## 2015-05-22 VITALS — BP 119/74 | HR 68 | Temp 98.1°F | Ht 65.0 in | Wt 117.6 lb

## 2015-05-22 DIAGNOSIS — C50411 Malignant neoplasm of upper-outer quadrant of right female breast: Secondary | ICD-10-CM

## 2015-05-22 DIAGNOSIS — Z171 Estrogen receptor negative status [ER-]: Secondary | ICD-10-CM | POA: Diagnosis not present

## 2015-05-23 NOTE — Addendum Note (Signed)
Encounter addended by: Ernst Spell, RN on: 05/23/2015  7:48 AM<BR>     Documentation filed: Charges VN

## 2015-06-07 ENCOUNTER — Telehealth: Payer: Self-pay | Admitting: *Deleted

## 2015-06-07 NOTE — Telephone Encounter (Signed)
Left message for a return phone call to follow up about her decision to get chemo and appt. With Dr. Lindi Adie.

## 2015-06-18 DIAGNOSIS — E538 Deficiency of other specified B group vitamins: Secondary | ICD-10-CM | POA: Diagnosis not present

## 2015-06-18 DIAGNOSIS — R7989 Other specified abnormal findings of blood chemistry: Secondary | ICD-10-CM | POA: Diagnosis not present

## 2015-06-18 DIAGNOSIS — R109 Unspecified abdominal pain: Secondary | ICD-10-CM | POA: Diagnosis not present

## 2015-06-18 DIAGNOSIS — G893 Neoplasm related pain (acute) (chronic): Secondary | ICD-10-CM | POA: Diagnosis not present

## 2015-06-18 DIAGNOSIS — E54 Ascorbic acid deficiency: Secondary | ICD-10-CM | POA: Diagnosis not present

## 2015-06-18 DIAGNOSIS — C50019 Malignant neoplasm of nipple and areola, unspecified female breast: Secondary | ICD-10-CM | POA: Diagnosis not present

## 2015-06-18 DIAGNOSIS — E785 Hyperlipidemia, unspecified: Secondary | ICD-10-CM | POA: Diagnosis not present

## 2015-06-18 DIAGNOSIS — E559 Vitamin D deficiency, unspecified: Secondary | ICD-10-CM | POA: Diagnosis not present

## 2015-06-18 DIAGNOSIS — M545 Low back pain: Secondary | ICD-10-CM | POA: Diagnosis not present

## 2015-06-18 DIAGNOSIS — R5382 Chronic fatigue, unspecified: Secondary | ICD-10-CM | POA: Diagnosis not present

## 2015-06-19 DIAGNOSIS — E032 Hypothyroidism due to medicaments and other exogenous substances: Secondary | ICD-10-CM | POA: Diagnosis not present

## 2015-06-19 DIAGNOSIS — D509 Iron deficiency anemia, unspecified: Secondary | ICD-10-CM | POA: Diagnosis not present

## 2015-06-20 ENCOUNTER — Telehealth: Payer: Self-pay | Admitting: *Deleted

## 2015-06-20 NOTE — Telephone Encounter (Signed)
Left message for a return phone call to follow up to see if she has changed her mind.  Her husband answered the phone and I was informed that she had to rush off to an appointment and he would have her call me back.

## 2015-09-03 DIAGNOSIS — D3132 Benign neoplasm of left choroid: Secondary | ICD-10-CM | POA: Diagnosis not present

## 2015-09-03 DIAGNOSIS — Z9889 Other specified postprocedural states: Secondary | ICD-10-CM | POA: Diagnosis not present

## 2015-09-03 DIAGNOSIS — E039 Hypothyroidism, unspecified: Secondary | ICD-10-CM | POA: Diagnosis not present

## 2015-09-03 DIAGNOSIS — Z853 Personal history of malignant neoplasm of breast: Secondary | ICD-10-CM | POA: Diagnosis not present

## 2015-09-03 DIAGNOSIS — K929 Disease of digestive system, unspecified: Secondary | ICD-10-CM | POA: Diagnosis not present

## 2015-09-03 DIAGNOSIS — H35362 Drusen (degenerative) of macula, left eye: Secondary | ICD-10-CM | POA: Diagnosis not present

## 2015-09-03 DIAGNOSIS — H2513 Age-related nuclear cataract, bilateral: Secondary | ICD-10-CM | POA: Diagnosis not present

## 2015-09-03 DIAGNOSIS — H538 Other visual disturbances: Secondary | ICD-10-CM | POA: Diagnosis not present

## 2015-09-03 DIAGNOSIS — M810 Age-related osteoporosis without current pathological fracture: Secondary | ICD-10-CM | POA: Diagnosis not present

## 2015-09-10 DIAGNOSIS — M130 Polyarthritis, unspecified: Secondary | ICD-10-CM | POA: Diagnosis not present

## 2015-09-10 DIAGNOSIS — E279 Disorder of adrenal gland, unspecified: Secondary | ICD-10-CM | POA: Diagnosis not present

## 2015-09-10 DIAGNOSIS — E039 Hypothyroidism, unspecified: Secondary | ICD-10-CM | POA: Diagnosis not present

## 2015-09-10 DIAGNOSIS — N951 Menopausal and female climacteric states: Secondary | ICD-10-CM | POA: Diagnosis not present

## 2015-09-10 DIAGNOSIS — E509 Vitamin A deficiency, unspecified: Secondary | ICD-10-CM | POA: Diagnosis not present

## 2015-09-10 DIAGNOSIS — D508 Other iron deficiency anemias: Secondary | ICD-10-CM | POA: Diagnosis not present

## 2015-09-10 DIAGNOSIS — E559 Vitamin D deficiency, unspecified: Secondary | ICD-10-CM | POA: Diagnosis not present

## 2015-09-30 DIAGNOSIS — R100 Acute abdomen: Secondary | ICD-10-CM | POA: Diagnosis not present

## 2015-09-30 DIAGNOSIS — E559 Vitamin D deficiency, unspecified: Secondary | ICD-10-CM | POA: Diagnosis not present

## 2015-09-30 DIAGNOSIS — C169 Malignant neoplasm of stomach, unspecified: Secondary | ICD-10-CM | POA: Diagnosis not present

## 2015-09-30 DIAGNOSIS — R5382 Chronic fatigue, unspecified: Secondary | ICD-10-CM | POA: Diagnosis not present

## 2015-09-30 DIAGNOSIS — R7989 Other specified abnormal findings of blood chemistry: Secondary | ICD-10-CM | POA: Diagnosis not present

## 2015-09-30 DIAGNOSIS — E54 Ascorbic acid deficiency: Secondary | ICD-10-CM | POA: Diagnosis not present

## 2015-09-30 DIAGNOSIS — E785 Hyperlipidemia, unspecified: Secondary | ICD-10-CM | POA: Diagnosis not present

## 2015-09-30 DIAGNOSIS — M545 Low back pain: Secondary | ICD-10-CM | POA: Diagnosis not present

## 2015-09-30 DIAGNOSIS — E032 Hypothyroidism due to medicaments and other exogenous substances: Secondary | ICD-10-CM | POA: Diagnosis not present

## 2015-10-03 DIAGNOSIS — C50911 Malignant neoplasm of unspecified site of right female breast: Secondary | ICD-10-CM | POA: Diagnosis not present

## 2015-11-14 DIAGNOSIS — D3122 Benign neoplasm of left retina: Secondary | ICD-10-CM | POA: Diagnosis not present

## 2016-01-22 DIAGNOSIS — E559 Vitamin D deficiency, unspecified: Secondary | ICD-10-CM | POA: Diagnosis not present

## 2016-01-22 DIAGNOSIS — E785 Hyperlipidemia, unspecified: Secondary | ICD-10-CM | POA: Diagnosis not present

## 2016-01-22 DIAGNOSIS — M545 Low back pain: Secondary | ICD-10-CM | POA: Diagnosis not present

## 2016-01-22 DIAGNOSIS — R5382 Chronic fatigue, unspecified: Secondary | ICD-10-CM | POA: Diagnosis not present

## 2016-01-22 DIAGNOSIS — R7989 Other specified abnormal findings of blood chemistry: Secondary | ICD-10-CM | POA: Diagnosis not present

## 2016-01-22 DIAGNOSIS — E032 Hypothyroidism due to medicaments and other exogenous substances: Secondary | ICD-10-CM | POA: Diagnosis not present

## 2016-01-29 DIAGNOSIS — E54 Ascorbic acid deficiency: Secondary | ICD-10-CM | POA: Diagnosis not present

## 2016-03-19 DIAGNOSIS — E559 Vitamin D deficiency, unspecified: Secondary | ICD-10-CM | POA: Diagnosis not present

## 2016-04-07 DIAGNOSIS — Z79899 Other long term (current) drug therapy: Secondary | ICD-10-CM | POA: Diagnosis not present

## 2016-04-07 DIAGNOSIS — D3132 Benign neoplasm of left choroid: Secondary | ICD-10-CM | POA: Diagnosis not present

## 2016-04-07 DIAGNOSIS — Z888 Allergy status to other drugs, medicaments and biological substances status: Secondary | ICD-10-CM | POA: Diagnosis not present

## 2016-04-07 DIAGNOSIS — H2513 Age-related nuclear cataract, bilateral: Secondary | ICD-10-CM | POA: Diagnosis not present

## 2016-04-07 DIAGNOSIS — E039 Hypothyroidism, unspecified: Secondary | ICD-10-CM | POA: Diagnosis not present

## 2016-04-07 DIAGNOSIS — Z823 Family history of stroke: Secondary | ICD-10-CM | POA: Diagnosis not present

## 2016-04-07 DIAGNOSIS — C50919 Malignant neoplasm of unspecified site of unspecified female breast: Secondary | ICD-10-CM | POA: Diagnosis not present

## 2016-04-07 DIAGNOSIS — Z83518 Family history of other specified eye disorder: Secondary | ICD-10-CM | POA: Diagnosis not present

## 2016-04-07 DIAGNOSIS — R5381 Other malaise: Secondary | ICD-10-CM | POA: Diagnosis not present

## 2016-04-07 DIAGNOSIS — Z8249 Family history of ischemic heart disease and other diseases of the circulatory system: Secondary | ICD-10-CM | POA: Diagnosis not present

## 2016-04-07 DIAGNOSIS — H43399 Other vitreous opacities, unspecified eye: Secondary | ICD-10-CM | POA: Diagnosis not present

## 2016-04-07 DIAGNOSIS — Z91018 Allergy to other foods: Secondary | ICD-10-CM | POA: Diagnosis not present

## 2016-04-07 DIAGNOSIS — R5383 Other fatigue: Secondary | ICD-10-CM | POA: Diagnosis not present

## 2016-04-07 DIAGNOSIS — M810 Age-related osteoporosis without current pathological fracture: Secondary | ICD-10-CM | POA: Diagnosis not present

## 2016-04-21 DIAGNOSIS — A89 Unspecified viral infection of central nervous system: Secondary | ICD-10-CM | POA: Diagnosis not present

## 2016-04-21 DIAGNOSIS — R5382 Chronic fatigue, unspecified: Secondary | ICD-10-CM | POA: Diagnosis not present

## 2016-04-21 DIAGNOSIS — R100 Acute abdomen: Secondary | ICD-10-CM | POA: Diagnosis not present

## 2016-04-21 DIAGNOSIS — G893 Neoplasm related pain (acute) (chronic): Secondary | ICD-10-CM | POA: Diagnosis not present

## 2016-04-21 DIAGNOSIS — C50919 Malignant neoplasm of unspecified site of unspecified female breast: Secondary | ICD-10-CM | POA: Diagnosis not present

## 2016-04-21 DIAGNOSIS — E54 Ascorbic acid deficiency: Secondary | ICD-10-CM | POA: Diagnosis not present

## 2016-04-21 DIAGNOSIS — R7989 Other specified abnormal findings of blood chemistry: Secondary | ICD-10-CM | POA: Diagnosis not present

## 2016-04-21 DIAGNOSIS — E785 Hyperlipidemia, unspecified: Secondary | ICD-10-CM | POA: Diagnosis not present

## 2016-04-21 DIAGNOSIS — C188 Malignant neoplasm of overlapping sites of colon: Secondary | ICD-10-CM | POA: Diagnosis not present

## 2016-04-21 DIAGNOSIS — M2550 Pain in unspecified joint: Secondary | ICD-10-CM | POA: Diagnosis not present

## 2016-05-29 DIAGNOSIS — R7989 Other specified abnormal findings of blood chemistry: Secondary | ICD-10-CM | POA: Diagnosis not present

## 2016-05-29 DIAGNOSIS — E785 Hyperlipidemia, unspecified: Secondary | ICD-10-CM | POA: Diagnosis not present

## 2016-07-06 DIAGNOSIS — M5432 Sciatica, left side: Secondary | ICD-10-CM | POA: Diagnosis not present

## 2016-07-06 DIAGNOSIS — M9903 Segmental and somatic dysfunction of lumbar region: Secondary | ICD-10-CM | POA: Diagnosis not present

## 2016-07-06 DIAGNOSIS — M9905 Segmental and somatic dysfunction of pelvic region: Secondary | ICD-10-CM | POA: Diagnosis not present

## 2016-07-06 DIAGNOSIS — M9902 Segmental and somatic dysfunction of thoracic region: Secondary | ICD-10-CM | POA: Diagnosis not present

## 2016-07-10 DIAGNOSIS — M9903 Segmental and somatic dysfunction of lumbar region: Secondary | ICD-10-CM | POA: Diagnosis not present

## 2016-07-10 DIAGNOSIS — M5432 Sciatica, left side: Secondary | ICD-10-CM | POA: Diagnosis not present

## 2016-07-10 DIAGNOSIS — M9902 Segmental and somatic dysfunction of thoracic region: Secondary | ICD-10-CM | POA: Diagnosis not present

## 2016-07-10 DIAGNOSIS — M9905 Segmental and somatic dysfunction of pelvic region: Secondary | ICD-10-CM | POA: Diagnosis not present

## 2016-07-14 DIAGNOSIS — M5432 Sciatica, left side: Secondary | ICD-10-CM | POA: Diagnosis not present

## 2016-07-14 DIAGNOSIS — M9905 Segmental and somatic dysfunction of pelvic region: Secondary | ICD-10-CM | POA: Diagnosis not present

## 2016-07-14 DIAGNOSIS — M9902 Segmental and somatic dysfunction of thoracic region: Secondary | ICD-10-CM | POA: Diagnosis not present

## 2016-07-14 DIAGNOSIS — M9903 Segmental and somatic dysfunction of lumbar region: Secondary | ICD-10-CM | POA: Diagnosis not present

## 2016-07-17 DIAGNOSIS — M9902 Segmental and somatic dysfunction of thoracic region: Secondary | ICD-10-CM | POA: Diagnosis not present

## 2016-07-17 DIAGNOSIS — M9905 Segmental and somatic dysfunction of pelvic region: Secondary | ICD-10-CM | POA: Diagnosis not present

## 2016-07-17 DIAGNOSIS — M5432 Sciatica, left side: Secondary | ICD-10-CM | POA: Diagnosis not present

## 2016-07-17 DIAGNOSIS — M9903 Segmental and somatic dysfunction of lumbar region: Secondary | ICD-10-CM | POA: Diagnosis not present

## 2016-07-21 DIAGNOSIS — M9903 Segmental and somatic dysfunction of lumbar region: Secondary | ICD-10-CM | POA: Diagnosis not present

## 2016-07-21 DIAGNOSIS — M9902 Segmental and somatic dysfunction of thoracic region: Secondary | ICD-10-CM | POA: Diagnosis not present

## 2016-07-21 DIAGNOSIS — M5432 Sciatica, left side: Secondary | ICD-10-CM | POA: Diagnosis not present

## 2016-07-21 DIAGNOSIS — M9905 Segmental and somatic dysfunction of pelvic region: Secondary | ICD-10-CM | POA: Diagnosis not present

## 2016-07-24 DIAGNOSIS — M5432 Sciatica, left side: Secondary | ICD-10-CM | POA: Diagnosis not present

## 2016-07-24 DIAGNOSIS — M9902 Segmental and somatic dysfunction of thoracic region: Secondary | ICD-10-CM | POA: Diagnosis not present

## 2016-07-24 DIAGNOSIS — M9903 Segmental and somatic dysfunction of lumbar region: Secondary | ICD-10-CM | POA: Diagnosis not present

## 2016-07-24 DIAGNOSIS — M9905 Segmental and somatic dysfunction of pelvic region: Secondary | ICD-10-CM | POA: Diagnosis not present

## 2016-07-31 DIAGNOSIS — M9903 Segmental and somatic dysfunction of lumbar region: Secondary | ICD-10-CM | POA: Diagnosis not present

## 2016-07-31 DIAGNOSIS — M5432 Sciatica, left side: Secondary | ICD-10-CM | POA: Diagnosis not present

## 2016-07-31 DIAGNOSIS — M9902 Segmental and somatic dysfunction of thoracic region: Secondary | ICD-10-CM | POA: Diagnosis not present

## 2016-07-31 DIAGNOSIS — M9905 Segmental and somatic dysfunction of pelvic region: Secondary | ICD-10-CM | POA: Diagnosis not present

## 2016-08-04 DIAGNOSIS — M9903 Segmental and somatic dysfunction of lumbar region: Secondary | ICD-10-CM | POA: Diagnosis not present

## 2016-08-04 DIAGNOSIS — M9902 Segmental and somatic dysfunction of thoracic region: Secondary | ICD-10-CM | POA: Diagnosis not present

## 2016-08-04 DIAGNOSIS — M9905 Segmental and somatic dysfunction of pelvic region: Secondary | ICD-10-CM | POA: Diagnosis not present

## 2016-08-04 DIAGNOSIS — M5432 Sciatica, left side: Secondary | ICD-10-CM | POA: Diagnosis not present

## 2016-08-07 DIAGNOSIS — M9905 Segmental and somatic dysfunction of pelvic region: Secondary | ICD-10-CM | POA: Diagnosis not present

## 2016-08-07 DIAGNOSIS — M5432 Sciatica, left side: Secondary | ICD-10-CM | POA: Diagnosis not present

## 2016-08-07 DIAGNOSIS — M9903 Segmental and somatic dysfunction of lumbar region: Secondary | ICD-10-CM | POA: Diagnosis not present

## 2016-08-07 DIAGNOSIS — M9902 Segmental and somatic dysfunction of thoracic region: Secondary | ICD-10-CM | POA: Diagnosis not present

## 2016-08-11 DIAGNOSIS — M9903 Segmental and somatic dysfunction of lumbar region: Secondary | ICD-10-CM | POA: Diagnosis not present

## 2016-08-11 DIAGNOSIS — M5432 Sciatica, left side: Secondary | ICD-10-CM | POA: Diagnosis not present

## 2016-08-11 DIAGNOSIS — M9902 Segmental and somatic dysfunction of thoracic region: Secondary | ICD-10-CM | POA: Diagnosis not present

## 2016-08-11 DIAGNOSIS — M9905 Segmental and somatic dysfunction of pelvic region: Secondary | ICD-10-CM | POA: Diagnosis not present

## 2016-08-14 DIAGNOSIS — M5432 Sciatica, left side: Secondary | ICD-10-CM | POA: Diagnosis not present

## 2016-08-14 DIAGNOSIS — M9903 Segmental and somatic dysfunction of lumbar region: Secondary | ICD-10-CM | POA: Diagnosis not present

## 2016-08-14 DIAGNOSIS — M9902 Segmental and somatic dysfunction of thoracic region: Secondary | ICD-10-CM | POA: Diagnosis not present

## 2016-08-14 DIAGNOSIS — M9905 Segmental and somatic dysfunction of pelvic region: Secondary | ICD-10-CM | POA: Diagnosis not present

## 2016-08-18 DIAGNOSIS — M9903 Segmental and somatic dysfunction of lumbar region: Secondary | ICD-10-CM | POA: Diagnosis not present

## 2016-08-18 DIAGNOSIS — M9905 Segmental and somatic dysfunction of pelvic region: Secondary | ICD-10-CM | POA: Diagnosis not present

## 2016-08-18 DIAGNOSIS — M9902 Segmental and somatic dysfunction of thoracic region: Secondary | ICD-10-CM | POA: Diagnosis not present

## 2016-08-18 DIAGNOSIS — M5432 Sciatica, left side: Secondary | ICD-10-CM | POA: Diagnosis not present

## 2016-08-19 DIAGNOSIS — G893 Neoplasm related pain (acute) (chronic): Secondary | ICD-10-CM | POA: Diagnosis not present

## 2016-08-19 DIAGNOSIS — C50919 Malignant neoplasm of unspecified site of unspecified female breast: Secondary | ICD-10-CM | POA: Diagnosis not present

## 2016-08-19 DIAGNOSIS — R7989 Other specified abnormal findings of blood chemistry: Secondary | ICD-10-CM | POA: Diagnosis not present

## 2016-08-19 DIAGNOSIS — E785 Hyperlipidemia, unspecified: Secondary | ICD-10-CM | POA: Diagnosis not present

## 2016-08-19 DIAGNOSIS — R5382 Chronic fatigue, unspecified: Secondary | ICD-10-CM | POA: Diagnosis not present

## 2016-08-19 DIAGNOSIS — M545 Low back pain: Secondary | ICD-10-CM | POA: Diagnosis not present

## 2016-08-21 DIAGNOSIS — M5432 Sciatica, left side: Secondary | ICD-10-CM | POA: Diagnosis not present

## 2016-08-21 DIAGNOSIS — M9903 Segmental and somatic dysfunction of lumbar region: Secondary | ICD-10-CM | POA: Diagnosis not present

## 2016-08-21 DIAGNOSIS — M9902 Segmental and somatic dysfunction of thoracic region: Secondary | ICD-10-CM | POA: Diagnosis not present

## 2016-08-21 DIAGNOSIS — M9905 Segmental and somatic dysfunction of pelvic region: Secondary | ICD-10-CM | POA: Diagnosis not present

## 2016-08-25 DIAGNOSIS — M9903 Segmental and somatic dysfunction of lumbar region: Secondary | ICD-10-CM | POA: Diagnosis not present

## 2016-08-25 DIAGNOSIS — M9905 Segmental and somatic dysfunction of pelvic region: Secondary | ICD-10-CM | POA: Diagnosis not present

## 2016-08-25 DIAGNOSIS — M5432 Sciatica, left side: Secondary | ICD-10-CM | POA: Diagnosis not present

## 2016-08-25 DIAGNOSIS — M9902 Segmental and somatic dysfunction of thoracic region: Secondary | ICD-10-CM | POA: Diagnosis not present

## 2016-08-28 DIAGNOSIS — M9902 Segmental and somatic dysfunction of thoracic region: Secondary | ICD-10-CM | POA: Diagnosis not present

## 2016-08-28 DIAGNOSIS — M5432 Sciatica, left side: Secondary | ICD-10-CM | POA: Diagnosis not present

## 2016-08-28 DIAGNOSIS — M9905 Segmental and somatic dysfunction of pelvic region: Secondary | ICD-10-CM | POA: Diagnosis not present

## 2016-08-28 DIAGNOSIS — M9903 Segmental and somatic dysfunction of lumbar region: Secondary | ICD-10-CM | POA: Diagnosis not present

## 2016-08-31 DIAGNOSIS — M9903 Segmental and somatic dysfunction of lumbar region: Secondary | ICD-10-CM | POA: Diagnosis not present

## 2016-08-31 DIAGNOSIS — M5432 Sciatica, left side: Secondary | ICD-10-CM | POA: Diagnosis not present

## 2016-08-31 DIAGNOSIS — M9902 Segmental and somatic dysfunction of thoracic region: Secondary | ICD-10-CM | POA: Diagnosis not present

## 2016-08-31 DIAGNOSIS — M9905 Segmental and somatic dysfunction of pelvic region: Secondary | ICD-10-CM | POA: Diagnosis not present

## 2016-09-02 DIAGNOSIS — M9902 Segmental and somatic dysfunction of thoracic region: Secondary | ICD-10-CM | POA: Diagnosis not present

## 2016-09-02 DIAGNOSIS — M9903 Segmental and somatic dysfunction of lumbar region: Secondary | ICD-10-CM | POA: Diagnosis not present

## 2016-09-02 DIAGNOSIS — M9905 Segmental and somatic dysfunction of pelvic region: Secondary | ICD-10-CM | POA: Diagnosis not present

## 2016-09-02 DIAGNOSIS — M5432 Sciatica, left side: Secondary | ICD-10-CM | POA: Diagnosis not present

## 2016-09-08 DIAGNOSIS — M9905 Segmental and somatic dysfunction of pelvic region: Secondary | ICD-10-CM | POA: Diagnosis not present

## 2016-09-08 DIAGNOSIS — M5432 Sciatica, left side: Secondary | ICD-10-CM | POA: Diagnosis not present

## 2016-09-08 DIAGNOSIS — M9902 Segmental and somatic dysfunction of thoracic region: Secondary | ICD-10-CM | POA: Diagnosis not present

## 2016-09-08 DIAGNOSIS — M9903 Segmental and somatic dysfunction of lumbar region: Secondary | ICD-10-CM | POA: Diagnosis not present

## 2016-09-11 DIAGNOSIS — M5432 Sciatica, left side: Secondary | ICD-10-CM | POA: Diagnosis not present

## 2016-09-11 DIAGNOSIS — M9902 Segmental and somatic dysfunction of thoracic region: Secondary | ICD-10-CM | POA: Diagnosis not present

## 2016-09-11 DIAGNOSIS — M9905 Segmental and somatic dysfunction of pelvic region: Secondary | ICD-10-CM | POA: Diagnosis not present

## 2016-09-11 DIAGNOSIS — M9903 Segmental and somatic dysfunction of lumbar region: Secondary | ICD-10-CM | POA: Diagnosis not present

## 2016-09-15 DIAGNOSIS — M9903 Segmental and somatic dysfunction of lumbar region: Secondary | ICD-10-CM | POA: Diagnosis not present

## 2016-09-15 DIAGNOSIS — M9902 Segmental and somatic dysfunction of thoracic region: Secondary | ICD-10-CM | POA: Diagnosis not present

## 2016-09-15 DIAGNOSIS — M9905 Segmental and somatic dysfunction of pelvic region: Secondary | ICD-10-CM | POA: Diagnosis not present

## 2016-09-15 DIAGNOSIS — M5432 Sciatica, left side: Secondary | ICD-10-CM | POA: Diagnosis not present

## 2016-09-22 DIAGNOSIS — M5432 Sciatica, left side: Secondary | ICD-10-CM | POA: Diagnosis not present

## 2016-09-22 DIAGNOSIS — M9902 Segmental and somatic dysfunction of thoracic region: Secondary | ICD-10-CM | POA: Diagnosis not present

## 2016-09-22 DIAGNOSIS — M9903 Segmental and somatic dysfunction of lumbar region: Secondary | ICD-10-CM | POA: Diagnosis not present

## 2016-09-22 DIAGNOSIS — M9905 Segmental and somatic dysfunction of pelvic region: Secondary | ICD-10-CM | POA: Diagnosis not present

## 2016-09-29 DIAGNOSIS — M9903 Segmental and somatic dysfunction of lumbar region: Secondary | ICD-10-CM | POA: Diagnosis not present

## 2016-09-29 DIAGNOSIS — M5432 Sciatica, left side: Secondary | ICD-10-CM | POA: Diagnosis not present

## 2016-09-29 DIAGNOSIS — M9905 Segmental and somatic dysfunction of pelvic region: Secondary | ICD-10-CM | POA: Diagnosis not present

## 2016-09-29 DIAGNOSIS — M9902 Segmental and somatic dysfunction of thoracic region: Secondary | ICD-10-CM | POA: Diagnosis not present

## 2016-10-02 DIAGNOSIS — M9903 Segmental and somatic dysfunction of lumbar region: Secondary | ICD-10-CM | POA: Diagnosis not present

## 2016-10-02 DIAGNOSIS — M5432 Sciatica, left side: Secondary | ICD-10-CM | POA: Diagnosis not present

## 2016-10-02 DIAGNOSIS — M9905 Segmental and somatic dysfunction of pelvic region: Secondary | ICD-10-CM | POA: Diagnosis not present

## 2016-10-02 DIAGNOSIS — M9902 Segmental and somatic dysfunction of thoracic region: Secondary | ICD-10-CM | POA: Diagnosis not present

## 2016-10-06 DIAGNOSIS — H2513 Age-related nuclear cataract, bilateral: Secondary | ICD-10-CM | POA: Diagnosis not present

## 2016-10-06 DIAGNOSIS — Z888 Allergy status to other drugs, medicaments and biological substances status: Secondary | ICD-10-CM | POA: Diagnosis not present

## 2016-10-06 DIAGNOSIS — D3132 Benign neoplasm of left choroid: Secondary | ICD-10-CM | POA: Diagnosis not present

## 2016-10-08 DIAGNOSIS — E559 Vitamin D deficiency, unspecified: Secondary | ICD-10-CM | POA: Diagnosis not present

## 2016-10-08 DIAGNOSIS — E785 Hyperlipidemia, unspecified: Secondary | ICD-10-CM | POA: Diagnosis not present

## 2016-10-08 DIAGNOSIS — R7989 Other specified abnormal findings of blood chemistry: Secondary | ICD-10-CM | POA: Diagnosis not present

## 2016-10-13 DIAGNOSIS — M9902 Segmental and somatic dysfunction of thoracic region: Secondary | ICD-10-CM | POA: Diagnosis not present

## 2016-10-13 DIAGNOSIS — M9903 Segmental and somatic dysfunction of lumbar region: Secondary | ICD-10-CM | POA: Diagnosis not present

## 2016-10-13 DIAGNOSIS — M9905 Segmental and somatic dysfunction of pelvic region: Secondary | ICD-10-CM | POA: Diagnosis not present

## 2016-10-13 DIAGNOSIS — M5432 Sciatica, left side: Secondary | ICD-10-CM | POA: Diagnosis not present

## 2016-10-20 DIAGNOSIS — M9903 Segmental and somatic dysfunction of lumbar region: Secondary | ICD-10-CM | POA: Diagnosis not present

## 2016-10-20 DIAGNOSIS — M9905 Segmental and somatic dysfunction of pelvic region: Secondary | ICD-10-CM | POA: Diagnosis not present

## 2016-10-20 DIAGNOSIS — M9902 Segmental and somatic dysfunction of thoracic region: Secondary | ICD-10-CM | POA: Diagnosis not present

## 2016-10-20 DIAGNOSIS — M5432 Sciatica, left side: Secondary | ICD-10-CM | POA: Diagnosis not present

## 2016-10-26 DIAGNOSIS — E611 Iron deficiency: Secondary | ICD-10-CM | POA: Diagnosis not present

## 2016-10-26 DIAGNOSIS — E039 Hypothyroidism, unspecified: Secondary | ICD-10-CM | POA: Diagnosis not present

## 2016-10-26 DIAGNOSIS — E2749 Other adrenocortical insufficiency: Secondary | ICD-10-CM | POA: Diagnosis not present

## 2016-10-26 DIAGNOSIS — N951 Menopausal and female climacteric states: Secondary | ICD-10-CM | POA: Diagnosis not present

## 2016-10-26 DIAGNOSIS — E509 Vitamin A deficiency, unspecified: Secondary | ICD-10-CM | POA: Diagnosis not present

## 2016-10-27 DIAGNOSIS — M5432 Sciatica, left side: Secondary | ICD-10-CM | POA: Diagnosis not present

## 2016-10-27 DIAGNOSIS — M9902 Segmental and somatic dysfunction of thoracic region: Secondary | ICD-10-CM | POA: Diagnosis not present

## 2016-10-27 DIAGNOSIS — M9903 Segmental and somatic dysfunction of lumbar region: Secondary | ICD-10-CM | POA: Diagnosis not present

## 2016-10-27 DIAGNOSIS — M9905 Segmental and somatic dysfunction of pelvic region: Secondary | ICD-10-CM | POA: Diagnosis not present

## 2016-11-03 DIAGNOSIS — M9903 Segmental and somatic dysfunction of lumbar region: Secondary | ICD-10-CM | POA: Diagnosis not present

## 2016-11-03 DIAGNOSIS — M9905 Segmental and somatic dysfunction of pelvic region: Secondary | ICD-10-CM | POA: Diagnosis not present

## 2016-11-03 DIAGNOSIS — M9902 Segmental and somatic dysfunction of thoracic region: Secondary | ICD-10-CM | POA: Diagnosis not present

## 2016-11-03 DIAGNOSIS — M5432 Sciatica, left side: Secondary | ICD-10-CM | POA: Diagnosis not present

## 2016-11-10 DIAGNOSIS — M9903 Segmental and somatic dysfunction of lumbar region: Secondary | ICD-10-CM | POA: Diagnosis not present

## 2016-11-10 DIAGNOSIS — M5432 Sciatica, left side: Secondary | ICD-10-CM | POA: Diagnosis not present

## 2016-11-10 DIAGNOSIS — M9902 Segmental and somatic dysfunction of thoracic region: Secondary | ICD-10-CM | POA: Diagnosis not present

## 2016-11-10 DIAGNOSIS — M9905 Segmental and somatic dysfunction of pelvic region: Secondary | ICD-10-CM | POA: Diagnosis not present

## 2016-11-17 DIAGNOSIS — M9902 Segmental and somatic dysfunction of thoracic region: Secondary | ICD-10-CM | POA: Diagnosis not present

## 2016-11-17 DIAGNOSIS — M9905 Segmental and somatic dysfunction of pelvic region: Secondary | ICD-10-CM | POA: Diagnosis not present

## 2016-11-17 DIAGNOSIS — M5432 Sciatica, left side: Secondary | ICD-10-CM | POA: Diagnosis not present

## 2016-11-17 DIAGNOSIS — M9903 Segmental and somatic dysfunction of lumbar region: Secondary | ICD-10-CM | POA: Diagnosis not present

## 2016-11-24 DIAGNOSIS — M9903 Segmental and somatic dysfunction of lumbar region: Secondary | ICD-10-CM | POA: Diagnosis not present

## 2016-11-24 DIAGNOSIS — M9905 Segmental and somatic dysfunction of pelvic region: Secondary | ICD-10-CM | POA: Diagnosis not present

## 2016-11-24 DIAGNOSIS — M5432 Sciatica, left side: Secondary | ICD-10-CM | POA: Diagnosis not present

## 2016-11-24 DIAGNOSIS — M9902 Segmental and somatic dysfunction of thoracic region: Secondary | ICD-10-CM | POA: Diagnosis not present

## 2016-12-01 DIAGNOSIS — M9902 Segmental and somatic dysfunction of thoracic region: Secondary | ICD-10-CM | POA: Diagnosis not present

## 2016-12-01 DIAGNOSIS — M9905 Segmental and somatic dysfunction of pelvic region: Secondary | ICD-10-CM | POA: Diagnosis not present

## 2016-12-01 DIAGNOSIS — M5432 Sciatica, left side: Secondary | ICD-10-CM | POA: Diagnosis not present

## 2016-12-01 DIAGNOSIS — M9903 Segmental and somatic dysfunction of lumbar region: Secondary | ICD-10-CM | POA: Diagnosis not present

## 2016-12-08 DIAGNOSIS — M9905 Segmental and somatic dysfunction of pelvic region: Secondary | ICD-10-CM | POA: Diagnosis not present

## 2016-12-08 DIAGNOSIS — M5432 Sciatica, left side: Secondary | ICD-10-CM | POA: Diagnosis not present

## 2016-12-08 DIAGNOSIS — M9903 Segmental and somatic dysfunction of lumbar region: Secondary | ICD-10-CM | POA: Diagnosis not present

## 2016-12-08 DIAGNOSIS — M9902 Segmental and somatic dysfunction of thoracic region: Secondary | ICD-10-CM | POA: Diagnosis not present

## 2016-12-11 DIAGNOSIS — E785 Hyperlipidemia, unspecified: Secondary | ICD-10-CM | POA: Diagnosis not present

## 2016-12-11 DIAGNOSIS — R7989 Other specified abnormal findings of blood chemistry: Secondary | ICD-10-CM | POA: Diagnosis not present

## 2016-12-15 DIAGNOSIS — M9905 Segmental and somatic dysfunction of pelvic region: Secondary | ICD-10-CM | POA: Diagnosis not present

## 2016-12-15 DIAGNOSIS — M9902 Segmental and somatic dysfunction of thoracic region: Secondary | ICD-10-CM | POA: Diagnosis not present

## 2016-12-15 DIAGNOSIS — M5432 Sciatica, left side: Secondary | ICD-10-CM | POA: Diagnosis not present

## 2016-12-15 DIAGNOSIS — M9903 Segmental and somatic dysfunction of lumbar region: Secondary | ICD-10-CM | POA: Diagnosis not present

## 2016-12-29 DIAGNOSIS — M9903 Segmental and somatic dysfunction of lumbar region: Secondary | ICD-10-CM | POA: Diagnosis not present

## 2016-12-29 DIAGNOSIS — M9905 Segmental and somatic dysfunction of pelvic region: Secondary | ICD-10-CM | POA: Diagnosis not present

## 2016-12-29 DIAGNOSIS — M5432 Sciatica, left side: Secondary | ICD-10-CM | POA: Diagnosis not present

## 2016-12-29 DIAGNOSIS — M9902 Segmental and somatic dysfunction of thoracic region: Secondary | ICD-10-CM | POA: Diagnosis not present

## 2017-01-12 DIAGNOSIS — M9905 Segmental and somatic dysfunction of pelvic region: Secondary | ICD-10-CM | POA: Diagnosis not present

## 2017-01-12 DIAGNOSIS — M9902 Segmental and somatic dysfunction of thoracic region: Secondary | ICD-10-CM | POA: Diagnosis not present

## 2017-01-12 DIAGNOSIS — M5432 Sciatica, left side: Secondary | ICD-10-CM | POA: Diagnosis not present

## 2017-01-12 DIAGNOSIS — M9903 Segmental and somatic dysfunction of lumbar region: Secondary | ICD-10-CM | POA: Diagnosis not present

## 2017-01-26 DIAGNOSIS — M5432 Sciatica, left side: Secondary | ICD-10-CM | POA: Diagnosis not present

## 2017-01-26 DIAGNOSIS — M9903 Segmental and somatic dysfunction of lumbar region: Secondary | ICD-10-CM | POA: Diagnosis not present

## 2017-01-26 DIAGNOSIS — M9902 Segmental and somatic dysfunction of thoracic region: Secondary | ICD-10-CM | POA: Diagnosis not present

## 2017-01-26 DIAGNOSIS — M9905 Segmental and somatic dysfunction of pelvic region: Secondary | ICD-10-CM | POA: Diagnosis not present

## 2017-02-02 DIAGNOSIS — M5432 Sciatica, left side: Secondary | ICD-10-CM | POA: Diagnosis not present

## 2017-02-02 DIAGNOSIS — M9902 Segmental and somatic dysfunction of thoracic region: Secondary | ICD-10-CM | POA: Diagnosis not present

## 2017-02-02 DIAGNOSIS — M9905 Segmental and somatic dysfunction of pelvic region: Secondary | ICD-10-CM | POA: Diagnosis not present

## 2017-02-02 DIAGNOSIS — M9903 Segmental and somatic dysfunction of lumbar region: Secondary | ICD-10-CM | POA: Diagnosis not present

## 2017-02-09 DIAGNOSIS — M5432 Sciatica, left side: Secondary | ICD-10-CM | POA: Diagnosis not present

## 2017-02-09 DIAGNOSIS — M9902 Segmental and somatic dysfunction of thoracic region: Secondary | ICD-10-CM | POA: Diagnosis not present

## 2017-02-09 DIAGNOSIS — M9903 Segmental and somatic dysfunction of lumbar region: Secondary | ICD-10-CM | POA: Diagnosis not present

## 2017-02-09 DIAGNOSIS — M9905 Segmental and somatic dysfunction of pelvic region: Secondary | ICD-10-CM | POA: Diagnosis not present

## 2017-02-16 DIAGNOSIS — M9903 Segmental and somatic dysfunction of lumbar region: Secondary | ICD-10-CM | POA: Diagnosis not present

## 2017-02-16 DIAGNOSIS — M5432 Sciatica, left side: Secondary | ICD-10-CM | POA: Diagnosis not present

## 2017-02-16 DIAGNOSIS — M9902 Segmental and somatic dysfunction of thoracic region: Secondary | ICD-10-CM | POA: Diagnosis not present

## 2017-02-16 DIAGNOSIS — M9905 Segmental and somatic dysfunction of pelvic region: Secondary | ICD-10-CM | POA: Diagnosis not present

## 2017-02-23 DIAGNOSIS — M9905 Segmental and somatic dysfunction of pelvic region: Secondary | ICD-10-CM | POA: Diagnosis not present

## 2017-02-23 DIAGNOSIS — M9903 Segmental and somatic dysfunction of lumbar region: Secondary | ICD-10-CM | POA: Diagnosis not present

## 2017-02-23 DIAGNOSIS — M5432 Sciatica, left side: Secondary | ICD-10-CM | POA: Diagnosis not present

## 2017-02-23 DIAGNOSIS — M9902 Segmental and somatic dysfunction of thoracic region: Secondary | ICD-10-CM | POA: Diagnosis not present

## 2017-02-25 DIAGNOSIS — R5382 Chronic fatigue, unspecified: Secondary | ICD-10-CM | POA: Diagnosis not present

## 2017-02-25 DIAGNOSIS — R7989 Other specified abnormal findings of blood chemistry: Secondary | ICD-10-CM | POA: Diagnosis not present

## 2017-02-25 DIAGNOSIS — E559 Vitamin D deficiency, unspecified: Secondary | ICD-10-CM | POA: Diagnosis not present

## 2017-02-25 DIAGNOSIS — R100 Acute abdomen: Secondary | ICD-10-CM | POA: Diagnosis not present

## 2017-02-25 DIAGNOSIS — E785 Hyperlipidemia, unspecified: Secondary | ICD-10-CM | POA: Diagnosis not present

## 2017-02-25 DIAGNOSIS — M545 Low back pain: Secondary | ICD-10-CM | POA: Diagnosis not present

## 2017-02-25 DIAGNOSIS — C50319 Malignant neoplasm of lower-inner quadrant of unspecified female breast: Secondary | ICD-10-CM | POA: Diagnosis not present

## 2017-03-02 DIAGNOSIS — M9903 Segmental and somatic dysfunction of lumbar region: Secondary | ICD-10-CM | POA: Diagnosis not present

## 2017-03-02 DIAGNOSIS — M9905 Segmental and somatic dysfunction of pelvic region: Secondary | ICD-10-CM | POA: Diagnosis not present

## 2017-03-02 DIAGNOSIS — M9902 Segmental and somatic dysfunction of thoracic region: Secondary | ICD-10-CM | POA: Diagnosis not present

## 2017-03-02 DIAGNOSIS — M5432 Sciatica, left side: Secondary | ICD-10-CM | POA: Diagnosis not present

## 2017-03-09 DIAGNOSIS — M9905 Segmental and somatic dysfunction of pelvic region: Secondary | ICD-10-CM | POA: Diagnosis not present

## 2017-03-09 DIAGNOSIS — M5432 Sciatica, left side: Secondary | ICD-10-CM | POA: Diagnosis not present

## 2017-03-09 DIAGNOSIS — M9903 Segmental and somatic dysfunction of lumbar region: Secondary | ICD-10-CM | POA: Diagnosis not present

## 2017-03-09 DIAGNOSIS — M9902 Segmental and somatic dysfunction of thoracic region: Secondary | ICD-10-CM | POA: Diagnosis not present

## 2017-03-30 DIAGNOSIS — M9905 Segmental and somatic dysfunction of pelvic region: Secondary | ICD-10-CM | POA: Diagnosis not present

## 2017-03-30 DIAGNOSIS — M9902 Segmental and somatic dysfunction of thoracic region: Secondary | ICD-10-CM | POA: Diagnosis not present

## 2017-03-30 DIAGNOSIS — M9903 Segmental and somatic dysfunction of lumbar region: Secondary | ICD-10-CM | POA: Diagnosis not present

## 2017-03-30 DIAGNOSIS — M5432 Sciatica, left side: Secondary | ICD-10-CM | POA: Diagnosis not present

## 2017-04-06 DIAGNOSIS — M9903 Segmental and somatic dysfunction of lumbar region: Secondary | ICD-10-CM | POA: Diagnosis not present

## 2017-04-06 DIAGNOSIS — M9905 Segmental and somatic dysfunction of pelvic region: Secondary | ICD-10-CM | POA: Diagnosis not present

## 2017-04-06 DIAGNOSIS — M5432 Sciatica, left side: Secondary | ICD-10-CM | POA: Diagnosis not present

## 2017-04-06 DIAGNOSIS — M9902 Segmental and somatic dysfunction of thoracic region: Secondary | ICD-10-CM | POA: Diagnosis not present

## 2017-04-09 DIAGNOSIS — D3132 Benign neoplasm of left choroid: Secondary | ICD-10-CM | POA: Diagnosis not present

## 2017-04-20 DIAGNOSIS — M9905 Segmental and somatic dysfunction of pelvic region: Secondary | ICD-10-CM | POA: Diagnosis not present

## 2017-04-20 DIAGNOSIS — C50919 Malignant neoplasm of unspecified site of unspecified female breast: Secondary | ICD-10-CM | POA: Diagnosis not present

## 2017-04-20 DIAGNOSIS — M9902 Segmental and somatic dysfunction of thoracic region: Secondary | ICD-10-CM | POA: Diagnosis not present

## 2017-04-20 DIAGNOSIS — E785 Hyperlipidemia, unspecified: Secondary | ICD-10-CM | POA: Diagnosis not present

## 2017-04-20 DIAGNOSIS — E559 Vitamin D deficiency, unspecified: Secondary | ICD-10-CM | POA: Diagnosis not present

## 2017-04-20 DIAGNOSIS — R7989 Other specified abnormal findings of blood chemistry: Secondary | ICD-10-CM | POA: Diagnosis not present

## 2017-04-20 DIAGNOSIS — M5432 Sciatica, left side: Secondary | ICD-10-CM | POA: Diagnosis not present

## 2017-04-20 DIAGNOSIS — G893 Neoplasm related pain (acute) (chronic): Secondary | ICD-10-CM | POA: Diagnosis not present

## 2017-04-20 DIAGNOSIS — M9903 Segmental and somatic dysfunction of lumbar region: Secondary | ICD-10-CM | POA: Diagnosis not present

## 2017-04-20 DIAGNOSIS — E54 Ascorbic acid deficiency: Secondary | ICD-10-CM | POA: Diagnosis not present

## 2017-04-27 DIAGNOSIS — M9905 Segmental and somatic dysfunction of pelvic region: Secondary | ICD-10-CM | POA: Diagnosis not present

## 2017-04-27 DIAGNOSIS — M9902 Segmental and somatic dysfunction of thoracic region: Secondary | ICD-10-CM | POA: Diagnosis not present

## 2017-04-27 DIAGNOSIS — M5432 Sciatica, left side: Secondary | ICD-10-CM | POA: Diagnosis not present

## 2017-04-27 DIAGNOSIS — M9903 Segmental and somatic dysfunction of lumbar region: Secondary | ICD-10-CM | POA: Diagnosis not present

## 2017-07-16 DIAGNOSIS — C50411 Malignant neoplasm of upper-outer quadrant of right female breast: Secondary | ICD-10-CM | POA: Diagnosis not present

## 2017-07-16 DIAGNOSIS — E039 Hypothyroidism, unspecified: Secondary | ICD-10-CM | POA: Diagnosis not present

## 2017-07-16 DIAGNOSIS — G4700 Insomnia, unspecified: Secondary | ICD-10-CM | POA: Diagnosis not present

## 2017-08-12 DIAGNOSIS — E785 Hyperlipidemia, unspecified: Secondary | ICD-10-CM | POA: Diagnosis not present

## 2017-08-12 DIAGNOSIS — R7989 Other specified abnormal findings of blood chemistry: Secondary | ICD-10-CM | POA: Diagnosis not present

## 2017-10-14 DIAGNOSIS — N6342 Unspecified lump in left breast, subareolar: Secondary | ICD-10-CM | POA: Diagnosis not present

## 2017-10-14 DIAGNOSIS — M5430 Sciatica, unspecified side: Secondary | ICD-10-CM | POA: Diagnosis not present

## 2017-10-14 DIAGNOSIS — E039 Hypothyroidism, unspecified: Secondary | ICD-10-CM | POA: Diagnosis not present

## 2017-10-14 DIAGNOSIS — R5382 Chronic fatigue, unspecified: Secondary | ICD-10-CM | POA: Diagnosis not present

## 2017-10-22 ENCOUNTER — Other Ambulatory Visit: Payer: Self-pay

## 2017-12-18 IMAGING — US US  BREAST BX W/ LOC DEV 1ST LESION IMG BX SPEC US GUIDE*R*
1 series · 13 of 13 positions shown · non-contrast
Comparison: Previous exam(s).

ADDENDUM:
The right axilla was imaged prior to biopsy. The patient is very
thin making imaging of this area was difficult by ultrasound. There
is one borderline lymph node with possible cortical thickening.
Biopsy of the lymph node could be attempted if desired, however
would be technically challenging.

Pathology revealed grade I invasive ductal carcinoma in the right
breast. This was found to be concordant by Dr. Davo Tyrrell.
Pathology was discussed with the patient by telephone. She reported
doing well after the biopsy with minimal tenderness and bruising at
the site. Post biopsy instructions and care were reviewed and her
questions were answered. Surgical consultation has been scheduled
with Dr. Nazareth Jumper at [REDACTED] on
May 02, 2015. My number was provided to the patient for
additional concerns and questions.
Pathology results reported by Metekia Kindeya RN, BSN on April 01, 2015.
CLINICAL DATA: 64-year-old female presenting for ultrasound-guided
biopsy of a palpable right breast mass.
EXAM:
ULTRASOUND GUIDED RIGHT BREAST CORE NEEDLE BIOPSY

[Series 1: us breast bx w/ loc dev 1st lesion img bx spec us  · 0.06mm/px · 13 of 13 slices shown]
[im 1/13]
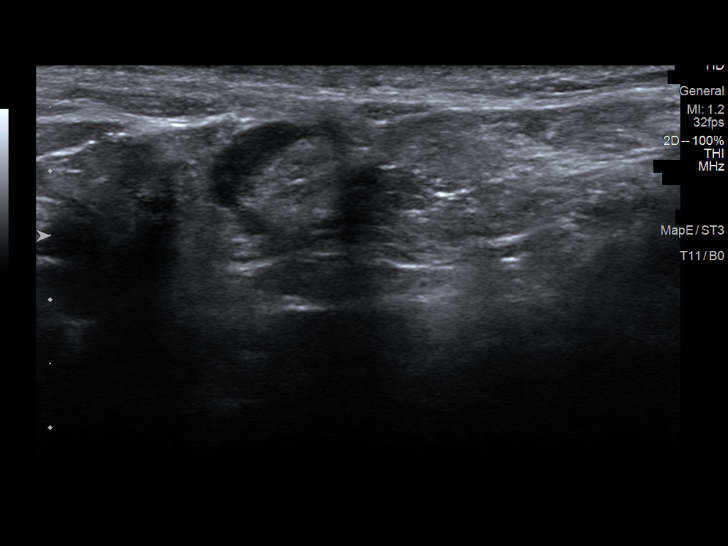
[im 2/13]
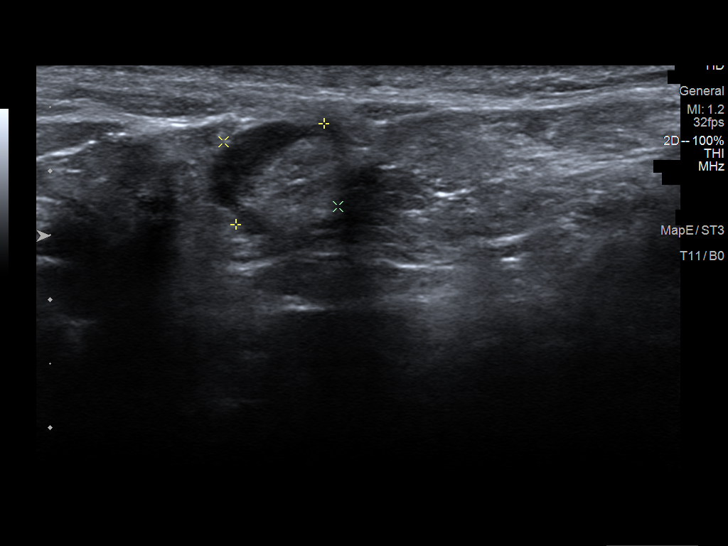
[im 3/13]
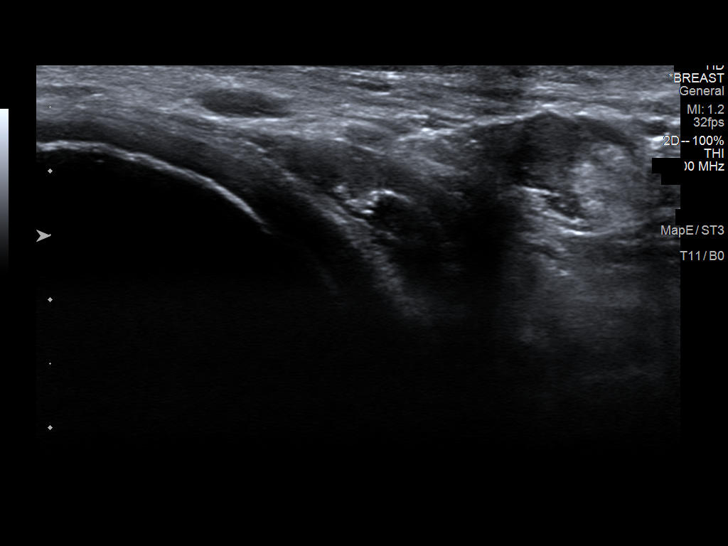
[im 4/13]
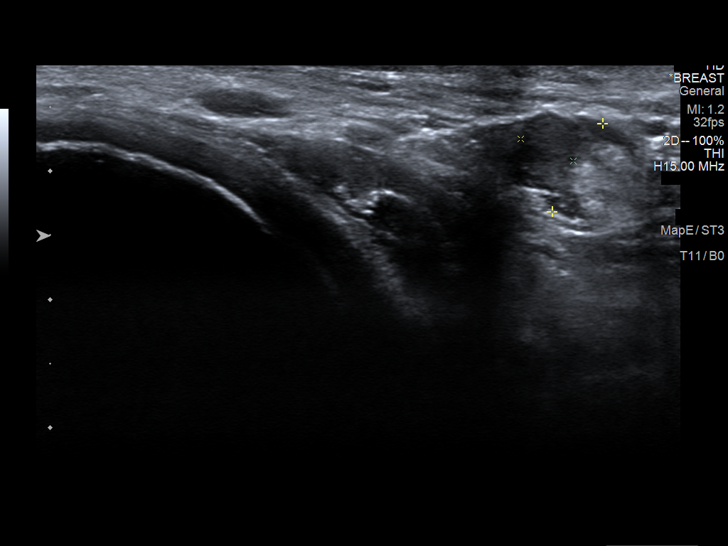
[im 5/13]
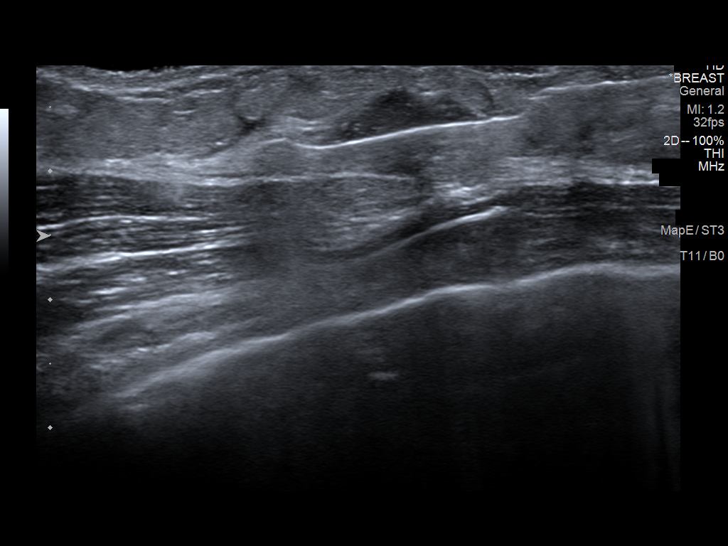
[im 6/13]
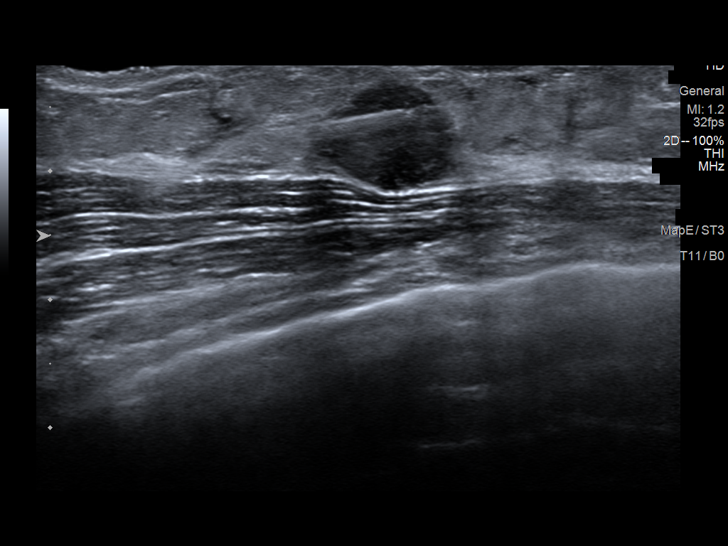
[im 7/13]
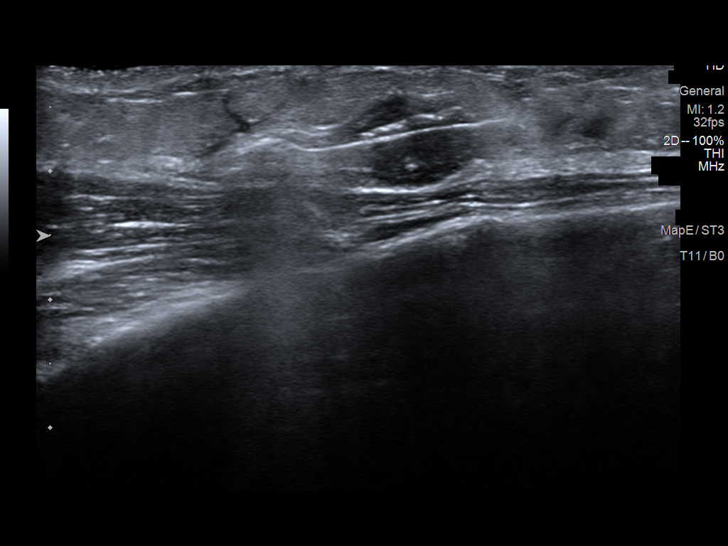
[im 8/13]
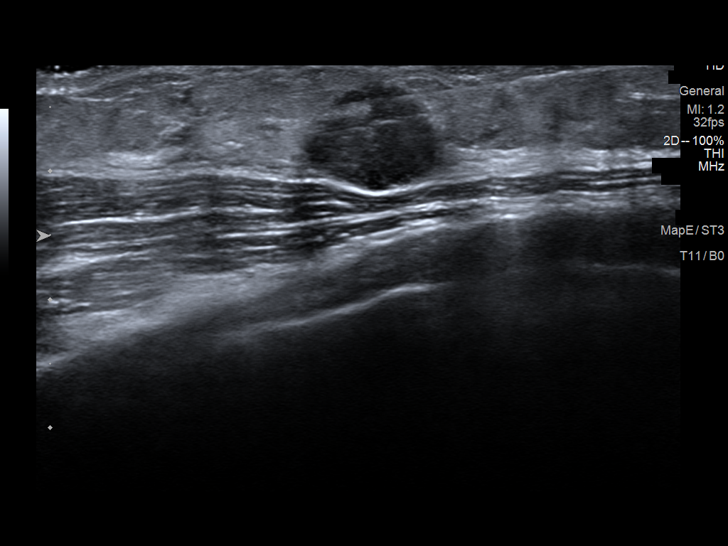
[im 9/13]
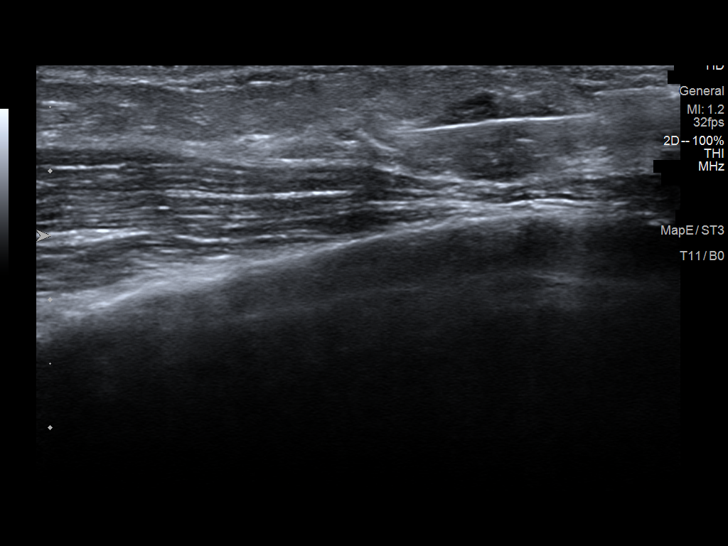
[im 10/13]
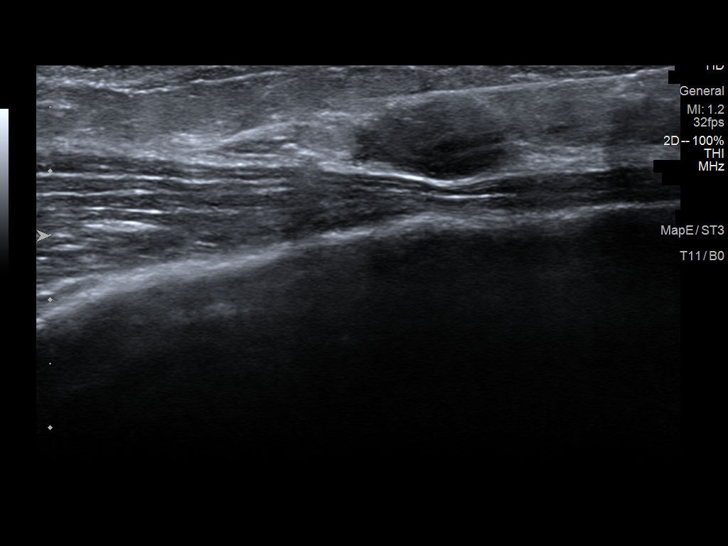
[im 11/13]
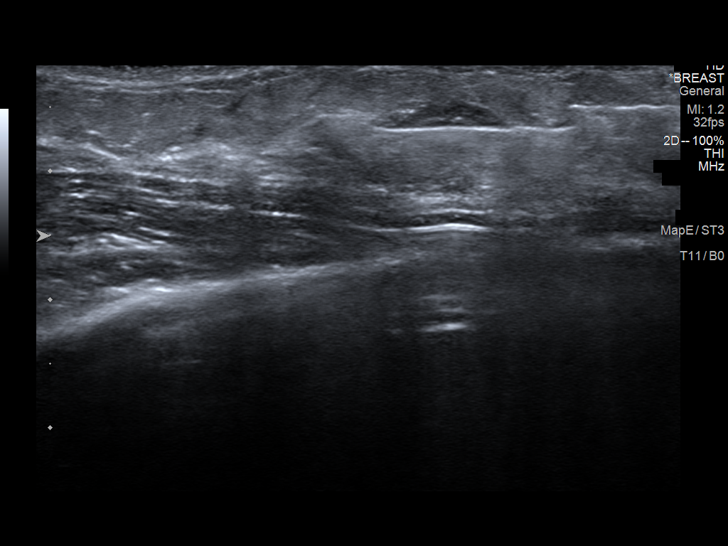
[im 12/13]
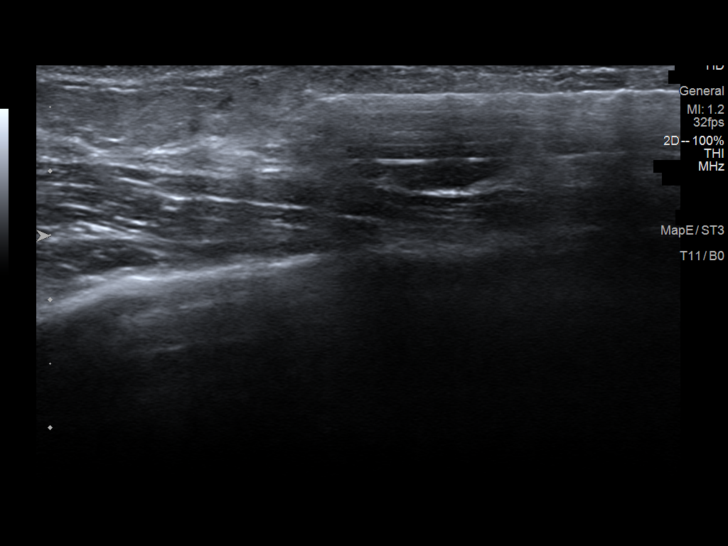
[im 13/13]
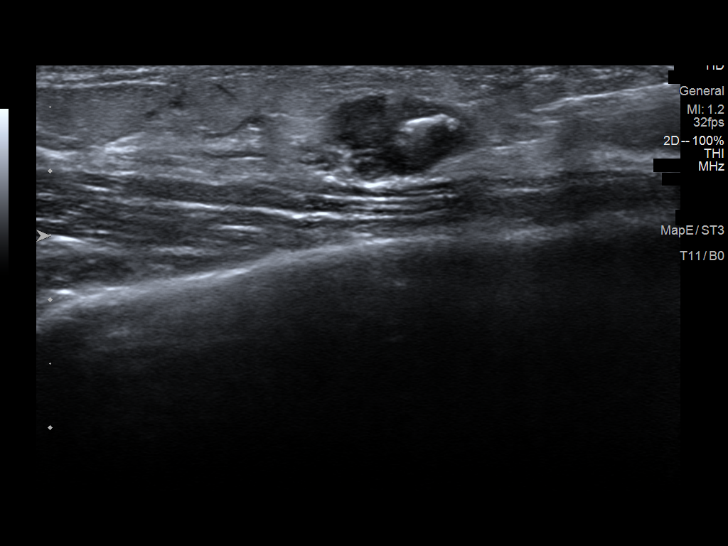

[13 of 13 positions shown; findings below may reference images not displayed]



Using sterile technique and 1% Lidocaine as local anesthetic, under
direct ultrasound visualization, a 14 gauge [place] device was
used to perform biopsy of palpable mass in the right breast at 3410
using a medial approach. At the conclusion of the procedure a coil
shaped tissue marker clip was deployed into the biopsy cavity.
Follow up 2 view mammogram was performed and dictated separately.
IMPRESSION: Ultrasound guided biopsy of mass in the right breast at 3410. No
apparent complications.

## 2018-01-19 ENCOUNTER — Other Ambulatory Visit: Payer: Self-pay | Admitting: Nurse Practitioner

## 2018-01-21 ENCOUNTER — Telehealth: Payer: Self-pay

## 2018-01-21 NOTE — Telephone Encounter (Signed)
Patient called requesting a refill on Ambien  Returned pt call and advised her it has been sent to the pharmacy. YRL,RMA

## 2018-02-10 ENCOUNTER — Other Ambulatory Visit: Payer: Self-pay

## 2018-03-20 ENCOUNTER — Other Ambulatory Visit: Payer: Self-pay | Admitting: Nurse Practitioner

## 2018-03-22 ENCOUNTER — Other Ambulatory Visit: Payer: Self-pay

## 2018-03-24 ENCOUNTER — Other Ambulatory Visit: Payer: Self-pay

## 2018-03-24 NOTE — Telephone Encounter (Signed)
Patient called requesting a refill on Zolpidem to be sent to the pharmacy.  Returned pt call and advised her that we have sent over the rx. YRL,RMA

## 2018-04-05 DIAGNOSIS — H2513 Age-related nuclear cataract, bilateral: Secondary | ICD-10-CM | POA: Insufficient documentation

## 2018-04-05 DIAGNOSIS — D3132 Benign neoplasm of left choroid: Secondary | ICD-10-CM | POA: Diagnosis not present

## 2018-04-14 ENCOUNTER — Encounter: Payer: Self-pay | Admitting: Internal Medicine

## 2018-04-14 ENCOUNTER — Other Ambulatory Visit: Payer: Self-pay

## 2018-04-14 ENCOUNTER — Ambulatory Visit (INDEPENDENT_AMBULATORY_CARE_PROVIDER_SITE_OTHER): Payer: Medicare Other | Admitting: Internal Medicine

## 2018-04-14 ENCOUNTER — Ambulatory Visit: Payer: Self-pay | Admitting: Internal Medicine

## 2018-04-14 VITALS — BP 108/78 | HR 71 | Temp 98.0°F | Resp 16 | Wt 123.0 lb

## 2018-04-14 DIAGNOSIS — Z853 Personal history of malignant neoplasm of breast: Secondary | ICD-10-CM

## 2018-04-14 DIAGNOSIS — Z13220 Encounter for screening for lipoid disorders: Secondary | ICD-10-CM | POA: Diagnosis not present

## 2018-04-14 DIAGNOSIS — R7309 Other abnormal glucose: Secondary | ICD-10-CM

## 2018-04-14 DIAGNOSIS — G4709 Other insomnia: Secondary | ICD-10-CM | POA: Diagnosis not present

## 2018-04-14 DIAGNOSIS — E038 Other specified hypothyroidism: Secondary | ICD-10-CM

## 2018-04-14 DIAGNOSIS — Z79899 Other long term (current) drug therapy: Secondary | ICD-10-CM

## 2018-04-14 NOTE — Progress Notes (Signed)
Subjective:     Patient ID: Virginia [name] , female    DOB: [birth year] , 68 y.o.   MRN: [id number]   Chief Complaint  Patient presents with  . Chronic Conditions    follow-up ambien    HPI Pt comes in today for her yearly breast xm and discuss Ambien use. She never ended up seeing her Naturopath for the tender lump and and I found last time I saw her and  this resolved after 6 days. She has been too busy and fatigued to make the 2 hour drive to go see her, but knows she needs to do this since she needs chelation treatments done. She sleeps well as long as she takes the 5 mg, used to  sleep walking if she took the 10 mg.    Past Medical History:  Diagnosis Date  . Abdominal distension   . Abdominal pain   . Arthritis    hands, feet  . Back pain with radiation   . Cancer ([place])    right breast  . Cough   . Dysrhythmia    rare palpitation  . GERD (gastroesophageal reflux disease)   . Hypothyroidism   . Low back pain with sciatica   . Lymph nodes enlarged   . Nasal congestion   . Palpitations   . Seizures ([place])    phenergan caused it  . Sore throat      Family History  Problem Relation Age of Onset  . Emphysema Father      Current Outpatient Medications:  .  Artificial Tear Solution (BION TEARS OP), Apply 1 drop to eye 3 (three) times daily as needed. Dry eyes, Disp: , Rfl:  .  Ascorbic Acid (VITAMIN C) POWD, Take 2.5 g by mouth 2 (two) times daily., Disp: , Rfl:  .  CALCIUM PO, Take 1-2 tablets by mouth 2 (two) times daily. Take 1 tablet every morning and take 2 tablets every evening., Disp: , Rfl:  .  Cholecalciferol (VITAMIN D PO), Take 4 drops by mouth daily., Disp: , Rfl:  .  COLLAGEN PO, Take by mouth., Disp: , Rfl:  .  Digestive Enzymes CAPS, Take 1 capsule by mouth 3 (three) times daily with meals., Disp: , Rfl:  .  HYDROcodone-acetaminophen (NORCO/VICODIN) 5-325 MG tablet, Take 1-2 tablets by mouth every 4 (four) hours as needed for moderate pain or severe  pain., Disp: 30 tablet, Rfl: 0 .  MAGNESIUM PO, Take 1 tablet by mouth daily., Disp: , Rfl:  .  Misc Natural Products (ADRENAL PO), Take 1 tablet by mouth daily., Disp: , Rfl:  .  Multiple Vitamin (MULTIVITAMIN WITH MINERALS) TABS, Take 1 tablet by mouth daily., Disp: , Rfl:  .  Omega-3 Fatty Acids (FISH OIL PO), Take 20 mLs by mouth 2 (two) times daily., Disp: , Rfl:  .  OVER THE COUNTER MEDICATION, Apply 1 application topically every morning. Source Naturals Progesterone Cream., Disp: , Rfl:  .  OVER THE COUNTER MEDICATION, Take 1 tablet by mouth daily. Pau d'Arco Tablets., Disp: , Rfl:  .  Pau D Arco 1000 MG CAPS, Take by mouth daily., Disp: , Rfl:  .  Probiotic Product (PROBIOTIC DAILY PO), Take 1 capsule by mouth daily., Disp: , Rfl:  .  sodium chloride (OCEAN) 0.65 % nasal spray, Place 1 spray into the nose daily as needed. For congestion., Disp: , Rfl:  .  thyroid (ARMOUR) 120 MG tablet, Take 120 mg by mouth daily., Disp: , Rfl:  .  Ubiquinol 100 MG CAPS, Take by mouth., Disp: , Rfl:  .  UNABLE TO FIND, Hemagenics 29 mg daily, Disp: , Rfl:  .  UNABLE TO FIND, Argentyn 23, Disp: , Rfl:  .  UNABLE TO FIND, [place] Salt, Disp: , Rfl:  .  UNABLE TO FIND, Med Name: iodine 12.5 mg twice daily, Disp: , Rfl:  .  UNABLE TO FIND, Med Name: Alphalipoic Acid 200mg  once daily., Disp: , Rfl:  .  UNABLE TO FIND, Med Name: Glutathione, 500 mg once daily., Disp: , Rfl:  .  Vaginal Lubricant (REPLENS) GEL, Place vaginally Nightly., Disp: , Rfl:  .  vitamin A 25000 UNIT capsule, Take 25,000 Units by mouth 2 (two) times daily., Disp: , Rfl:  .  Zinc 50 MG CAPS, Take 50 mg by mouth daily., Disp: , Rfl:  .  zolpidem (AMBIEN) 10 MG tablet, TAKE 1/2 (ONE-HALF) TABLET BY MOUTH AT BEDTIME, Disp: 30 tablet, Rfl: 0   Allergies  Allergen Reactions  . Antihistamines, Diphenhydramine-Type Other (See Comments)    Dehydration and fatigue   . Ofirmev [Acetaminophen] Other (See Comments)    Drop in BP  .  Phenergan [Promethazine] Other (See Comments)    Thinks it was a seizure  . Scopolamine Nausea And Vomiting     Review of Systems  Constitutional: Positive for fatigue. Negative for chills, diaphoresis, fever and unexpected weight change.  HENT: Negative for congestion.   Respiratory: Negative for cough, chest tightness and shortness of breath.   Cardiovascular: Negative for chest pain, palpitations and leg swelling.  Gastrointestinal: Negative for abdominal pain.       Getting abdominal gurgling which she thinks is from Vit C  Musculoskeletal: Negative for gait problem.  Skin: Negative for rash.     Today's Vitals   04/14/18 1501  BP: 108/78  Pulse: 71  Resp: 16  Temp: 98 F (36.7 C)  TempSrc: Oral  SpO2: 96%  Weight: 123 lb (55.8 kg)   Body mass index is 20.47 kg/m.   Objective:  Physical Exam Vitals signs and nursing note reviewed.  Constitutional:      Appearance: Normal appearance.  HENT:     Head: Normocephalic.     Right Ear: External ear normal.     Left Ear: External ear normal.     Nose: Nose normal.  Eyes:     General: No scleral icterus.       Right eye: No discharge.        Left eye: No discharge.     Conjunctiva/sclera: Conjunctivae normal.  Neck:     Musculoskeletal: Neck supple. No neck rigidity.     Comments: No thyroidmegally noted Cardiovascular:     Rate and Rhythm: Normal rate and regular rhythm.     Heart sounds: No murmur.  Pulmonary:     Effort: Pulmonary effort is normal.     Breath sounds: Normal breath sounds.  Chest:     Comments: No suspicious  chest/ breast lumps palpated.  Abdominal:     General: Abdomen is flat. There is no distension.     Palpations: Abdomen is soft.     Tenderness: There is no abdominal tenderness.  Musculoskeletal: Normal range of motion.  Lymphadenopathy:     Cervical: No cervical adenopathy.     Upper Body:     Right upper body: No supraclavicular, axillary or pectoral adenopathy.     Left upper  body: No supraclavicular, axillary or pectoral adenopathy.  Skin:  General: Skin is warm and dry.  Neurological:     Mental Status: She is alert and oriented to person, place, and time.  Psychiatric:        Mood and Affect: Mood normal.        Behavior: Behavior normal.        Thought Content: Thought content normal.        Judgment: Judgment normal.    Assessment And Plan:   1. Other specified hypothyroidism- chronic - TSH - T3, free - T4, Free - Lipid Profile  2. Other insomnia- chronic and stable with Ambien. [name] continue this.   3. History of right breast cancer- in remission.  - CBC no Diff  4. Long-term use of high-risk medication- chronic - CMP14 + Anion Gap  5. Abnormal glucose- in the past had HGBA1C of 5.7 - Hemoglobin A1c  6. Screening cholesterol level- screen - Lipid Profile   Fu in 6 months or sooner with Janece.   Nyheem Binette RODRIGUEZ-SOUTHWORTH, PA-C

## 2018-04-14 NOTE — Patient Instructions (Signed)
° ° ° °  If you have lab work done today you will be contacted with your lab results within the next 2 weeks.  If you have not heard from us then please contact us. The fastest way to get your results is to register for My Chart. ° ° °IF you received an x-ray today, you will receive an invoice from [facility]. Please contact [facility] at [phone] with questions or concerns regarding your invoice.  ° °IF you received labwork today, you will receive an invoice from [facility]. Please contact [facility] at [phone] with questions or concerns regarding your invoice.  ° °Our billing staff will not be able to assist you with questions regarding bills from these companies. ° °You will be contacted with the lab results as soon as they are available. The fastest way to get your results is to activate your My Chart account. Instructions are located on the last page of this paperwork. If you have not heard from us regarding the results in 2 weeks, please contact this office. °  ° ° ° °

## 2018-04-15 LAB — CMP14 + ANION GAP
ALT: 19 IU/L (ref 0–32)
AST: 23 IU/L (ref 0–40)
Albumin/Globulin Ratio: 2.2 (ref 1.2–2.2)
Albumin: 4.6 g/dL (ref 3.8–4.8)
Alkaline Phosphatase: 122 IU/L — ABNORMAL HIGH (ref 39–117)
Anion Gap: 15 mmol/L (ref 10.0–18.0)
BUN/Creatinine Ratio: 12 (ref 12–28)
BUN: 7 mg/dL — ABNORMAL LOW (ref 8–27)
Bilirubin Total: 0.2 mg/dL (ref 0.0–1.2)
CO2: 20 mmol/L (ref 20–29)
Calcium: 10 mg/dL (ref 8.7–10.3)
Chloride: 105 mmol/L (ref 96–106)
Creatinine, Ser: 0.58 mg/dL (ref 0.57–1.00)
GFR calc Af Amer: 111 mL/min/{1.73_m2} (ref >59)
GFR calc non Af Amer: 96 mL/min/{1.73_m2} (ref >59)
Globulin, Total: 2.1 g/dL (ref 1.5–4.5)
Glucose: 85 mg/dL (ref 65–99)
Potassium: 4.9 mmol/L (ref 3.5–5.2)
Sodium: 140 mmol/L (ref 134–144)
Total Protein: 6.7 g/dL (ref 6.0–8.5)

## 2018-04-15 LAB — CBC
HEMOGLOBIN: 13.4 g/dL (ref 11.1–15.9)
Hematocrit: 40 % (ref 34.0–46.6)
MCH: 30 pg (ref 26.6–33.0)
MCHC: 33.5 g/dL (ref 31.5–35.7)
MCV: 90 fL (ref 79–97)
Platelets: 317 10*3/uL (ref 150–450)
RBC: 4.47 x10E6/uL (ref 3.77–5.28)
RDW: 11.7 % (ref 11.7–15.4)
WBC: 4.1 10*3/uL (ref 3.4–10.8)

## 2018-04-15 LAB — HEMOGLOBIN A1C
Est. average glucose Bld gHb Est-mCnc: 111 mg/dL
Hgb A1c MFr Bld: 5.5 % (ref 4.8–5.6)

## 2018-04-15 LAB — LIPID PANEL
Chol/HDL Ratio: 1.8 ratio (ref 0.0–4.4)
Cholesterol, Total: 196 mg/dL (ref 100–199)
HDL: 107 mg/dL (ref >39)
LDL Calculated: 73 mg/dL (ref 0–99)
Triglycerides: 78 mg/dL (ref 0–149)
VLDL Cholesterol Cal: 16 mg/dL (ref 5–40)

## 2018-04-15 LAB — T3, FREE: T3, Free: 3 pg/mL (ref 2.0–4.4)

## 2018-04-15 LAB — TSH: TSH: 0.545 u[IU]/mL (ref 0.450–4.500)

## 2018-04-15 LAB — T4, FREE: Free T4: 0.79 ng/dL — ABNORMAL LOW (ref 0.82–1.77)

## 2018-05-16 ENCOUNTER — Other Ambulatory Visit: Payer: Self-pay | Admitting: Nurse Practitioner

## 2018-05-19 ENCOUNTER — Other Ambulatory Visit: Payer: Self-pay | Admitting: Internal Medicine

## 2018-05-19 ENCOUNTER — Encounter: Payer: Self-pay | Admitting: Internal Medicine

## 2018-05-20 ENCOUNTER — Other Ambulatory Visit: Payer: Self-pay | Admitting: Nurse Practitioner

## 2018-05-20 NOTE — Telephone Encounter (Signed)
Zolpidem refill

## 2018-05-23 ENCOUNTER — Other Ambulatory Visit: Payer: Self-pay | Admitting: Nurse Practitioner

## 2018-05-23 MED ORDER — ZOLPIDEM TARTRATE 10 MG PO TABS: 10.0000 mg | ORAL_TABLET | Freq: Every evening | ORAL | 1 refills | Status: DC | PRN

## 2018-05-26 ENCOUNTER — Encounter: Payer: Self-pay | Admitting: Internal Medicine

## 2018-06-21 ENCOUNTER — Encounter: Payer: Self-pay | Admitting: Internal Medicine

## 2018-06-22 ENCOUNTER — Encounter: Payer: Self-pay | Admitting: Nurse Practitioner

## 2018-08-16 DIAGNOSIS — R5382 Chronic fatigue, unspecified: Secondary | ICD-10-CM | POA: Diagnosis not present

## 2018-08-18 ENCOUNTER — Other Ambulatory Visit: Payer: Self-pay | Admitting: Internal Medicine

## 2018-09-14 ENCOUNTER — Other Ambulatory Visit: Payer: Self-pay | Admitting: Nurse Practitioner

## 2018-09-14 DIAGNOSIS — G4709 Other insomnia: Secondary | ICD-10-CM

## 2018-09-14 MED ORDER — ZOLPIDEM TARTRATE 10 MG PO TABS: 10.0000 mg | ORAL_TABLET | Freq: Every evening | ORAL | 0 refills | Status: DC | PRN

## 2018-09-14 NOTE — Telephone Encounter (Signed)
Zolpidem refill

## 2018-10-20 ENCOUNTER — Encounter: Payer: Self-pay | Admitting: Internal Medicine

## 2018-10-20 ENCOUNTER — Ambulatory Visit (INDEPENDENT_AMBULATORY_CARE_PROVIDER_SITE_OTHER): Payer: Medicare Other | Admitting: Internal Medicine

## 2018-10-20 ENCOUNTER — Other Ambulatory Visit: Payer: Self-pay

## 2018-10-20 ENCOUNTER — Ambulatory Visit (INDEPENDENT_AMBULATORY_CARE_PROVIDER_SITE_OTHER): Payer: Medicare Other

## 2018-10-20 VITALS — BP 102/60 | HR 68 | Temp 98.4°F | Ht 66.0 in | Wt 107.6 lb

## 2018-10-20 VITALS — BP 102/68 | HR 68 | Temp 98.4°F | Ht 66.0 in | Wt 107.6 lb

## 2018-10-20 DIAGNOSIS — E038 Other specified hypothyroidism: Secondary | ICD-10-CM

## 2018-10-20 DIAGNOSIS — Z0000 Encounter for general adult medical examination without abnormal findings: Secondary | ICD-10-CM

## 2018-10-20 DIAGNOSIS — Z853 Personal history of malignant neoplasm of breast: Secondary | ICD-10-CM | POA: Diagnosis not present

## 2018-10-20 DIAGNOSIS — R634 Abnormal weight loss: Secondary | ICD-10-CM

## 2018-10-20 NOTE — Patient Instructions (Signed)
Ms. Virginia [name] , Thank you for taking time to come for your Medicare Wellness Visit. I appreciate your ongoing commitment to your health goals. Please review the following plan we discussed and let me know if I can assist you in the future.   Screening recommendations/referrals: Colonoscopy: declines Mammogram: declines Bone Density: declines Recommended yearly ophthalmology/optometry visit for glaucoma screening and checkup Recommended yearly dental visit for hygiene and checkup  Vaccinations: Influenza vaccine: declines Pneumococcal vaccine: declines Tdap vaccine: declines Shingles vaccine: declines    Advanced directives: Advance directive discussed with you today. I have provided a copy for you to complete at home and have notarized. Once this is complete please bring a copy in to our office so we can scan it into your chart.   Conditions/risks identified: underweight  Next appointment: 04/24/2019 at 2:30   Preventive Care 1 Years and Older, Female Preventive care refers to lifestyle choices and visits with your health care provider that can promote health and wellness. What does preventive care include?  A yearly physical exam. This is also called an annual well check.  Dental exams once or twice a year.  Routine eye exams. Ask your health care provider how often you should have your eyes checked.  Personal lifestyle choices, including:  Daily care of your teeth and gums.  Regular physical activity.  Eating a healthy diet.  Avoiding tobacco and drug use.  Limiting alcohol use.  Practicing safe sex.  Taking low-dose aspirin every day.  Taking vitamin and mineral supplements as recommended by your health care provider. What happens during an annual well check? The services and screenings done by your health care provider during your annual well check will depend on your age, overall health, lifestyle risk factors, and family history of disease. Counseling  Your  health care provider may ask you questions about your:  Alcohol use.  Tobacco use.  Drug use.  Emotional well-being.  Home and relationship well-being.  Sexual activity.  Eating habits.  History of falls.  Memory and ability to understand (cognition).  Work and work Statistician.  Reproductive health. Screening  You may have the following tests or measurements:  Height, weight, and BMI.  Blood pressure.  Lipid and cholesterol levels. These may be checked every 5 years, or more frequently if you are over 58 years old.  Skin check.  Lung cancer screening. You may have this screening every year starting at age 54 if you have a 30-pack-year history of smoking and currently smoke or have quit within the past 15 years.  Fecal occult blood test (FOBT) of the stool. You may have this test every year starting at age 62.  Flexible sigmoidoscopy or colonoscopy. You may have a sigmoidoscopy every 5 years or a colonoscopy every 10 years starting at age 21.  Hepatitis C blood test.  Hepatitis B blood test.  Sexually transmitted disease (STD) testing.  Diabetes screening. This is done by checking your blood sugar (glucose) after you have not eaten for a while (fasting). You may have this done every 1-3 years.  Bone density scan. This is done to screen for osteoporosis. You may have this done starting at age 56.  Mammogram. This may be done every 1-2 years. Talk to your health care provider about how often you should have regular mammograms. Talk with your health care provider about your test results, treatment options, and if necessary, the need for more tests. Vaccines  Your health care provider may recommend certain vaccines, such as:  Influenza vaccine. This is recommended every year.  Tetanus, diphtheria, and acellular pertussis (Tdap, Td) vaccine. You may need a Td booster every 10 years.  Zoster vaccine. You may need this after age 55.  Pneumococcal 13-valent  conjugate (PCV13) vaccine. One dose is recommended after age 73.  Pneumococcal polysaccharide (PPSV23) vaccine. One dose is recommended after age 11. Talk to your health care provider about which screenings and vaccines you need and how often you need them. This information is not intended to replace advice given to you by your health care provider. Make sure you discuss any questions you have with your health care provider. Document Released: 04/05/2015 Document Revised: 11/27/2015 Document Reviewed: 01/08/2015 Elsevier Interactive Patient Education  2017 [place] Prevention in the Home Falls can cause injuries. They can happen to people of all ages. There are many things you can do to make your home safe and to help prevent falls. What can I do on the outside of my home?  Regularly fix the edges of walkways and driveways and fix any cracks.  Remove anything that might make you trip as you walk through a door, such as a raised step or threshold.  Trim any bushes or trees on the path to your home.  Use bright outdoor lighting.  Clear any walking paths of anything that might make someone trip, such as rocks or tools.  Regularly check to see if handrails are loose or broken. Make sure that both sides of any steps have handrails.  Any raised decks and porches should have guardrails on the edges.  Have any leaves, snow, or ice cleared regularly.  Use sand or salt on walking paths during winter.  Clean up any spills in your garage right away. This includes oil or grease spills. What can I do in the bathroom?  Use night lights.  Install grab bars by the toilet and in the tub and shower. Do not use towel bars as grab bars.  Use non-skid mats or decals in the tub or shower.  If you need to sit down in the shower, use a plastic, non-slip stool.  Keep the floor dry. Clean up any water that spills on the floor as soon as it happens.  Remove soap buildup in the tub or  shower regularly.  Attach bath mats securely with double-sided non-slip rug tape.  Do not have throw rugs and other things on the floor that can make you trip. What can I do in the bedroom?  Use night lights.  Make sure that you have a light by your bed that is easy to reach.  Do not use any sheets or blankets that are too big for your bed. They should not hang down onto the floor.  Have a firm chair that has side arms. You can use this for support while you get dressed.  Do not have throw rugs and other things on the floor that can make you trip. What can I do in the kitchen?  Clean up any spills right away.  Avoid walking on wet floors.  Keep items that you use a lot in easy-to-reach places.  If you need to reach something above you, use a strong step stool that has a grab bar.  Keep electrical cords out of the way.  Do not use floor polish or wax that makes floors slippery. If you must use wax, use non-skid floor wax.  Do not have throw rugs and other things on the floor that  can make you trip. What can I do with my stairs?  Do not leave any items on the stairs.  Make sure that there are handrails on both sides of the stairs and use them. Fix handrails that are broken or loose. Make sure that handrails are as long as the stairways.  Check any carpeting to make sure that it is firmly attached to the stairs. Fix any carpet that is loose or worn.  Avoid having throw rugs at the top or bottom of the stairs. If you do have throw rugs, attach them to the floor with carpet tape.  Make sure that you have a light switch at the top of the stairs and the bottom of the stairs. If you do not have them, ask someone to add them for you. What else can I do to help prevent falls?  Wear shoes that:  Do not have high heels.  Have rubber bottoms.  Are comfortable and fit you well.  Are closed at the toe. Do not wear sandals.  If you use a stepladder:  Make sure that it is fully  opened. Do not climb a closed stepladder.  Make sure that both sides of the stepladder are locked into place.  Ask someone to hold it for you, if possible.  Clearly mark and make sure that you can see:  Any grab bars or handrails.  First and last steps.  Where the edge of each step is.  Use tools that help you move around (mobility aids) if they are needed. These include:  Canes.  Walkers.  Scooters.  Crutches.  Turn on the lights when you go into a dark area. Replace any light bulbs as soon as they burn out.  Set up your furniture so you have a clear path. Avoid moving your furniture around.  If any of your floors are uneven, fix them.  If there are any pets around you, be aware of where they are.  Review your medicines with your doctor. Some medicines can make you feel dizzy. This can increase your chance of falling. Ask your doctor what other things that you can do to help prevent falls. This information is not intended to replace advice given to you by your health care provider. Make sure you discuss any questions you have with your health care provider. Document Released: 01/03/2009 Document Revised: 08/15/2015 Document Reviewed: 04/13/2014 Elsevier Interactive Patient Education  2017 [employer].

## 2018-10-20 NOTE — Progress Notes (Signed)
Subjective:   Virginia [name] is a 68 y.o. female who presents for Medicare Annual (Subsequent) preventive examination.  Review of Systems:  n/a Cardiac Risk Factors include: advanced age (>26men, >6 women)     Objective:     Vitals: BP 102/60 (BP Location: Left Arm, Patient Position: Sitting, Cuff Size: Normal)   Pulse 68   Temp 98.4 F (36.9 C) (Oral)   Ht 5\' 6"  (1.676 m)   Wt 107 lb 9.6 oz (48.8 kg)   SpO2 98%   BMI 17.37 kg/m   Body mass index is 17.37 kg/m.  Advanced Directives 10/20/2018 05/22/2015 04/24/2015 04/19/2015 04/15/2015 02/12/2012 02/09/2012  Does Patient Have a Medical Advance Directive? No No No No Yes Patient does not have advance directive Patient does not have advance directive;Patient would like information  Copy of [place] in Chart? - - No - copy requested No - copy requested - - -  Would patient like information on creating a medical advance directive? Yes (MAU/Ambulatory/Procedural Areas - Information given) Yes - Educational materials given No - patient declined information - - - Advance directive packet given  Pre-existing out of facility DNR order (yellow form or pink MOST form) - - - - - - No    Tobacco Social History   Tobacco Use  Smoking Status Never Smoker  Smokeless Tobacco Never Used     Counseling given: Not Answered   Clinical Intake:  Pre-visit preparation completed: Yes  Pain : No/denies pain     Nutritional Status: BMI <19  Underweight Nutritional Risks: Unintentional weight loss Diabetes: No  How often do you need to have someone help you when you read instructions, pamphlets, or other written materials from your doctor or pharmacy?: 1 - Never What is the last grade level you completed in school?: master's degree  Interpreter Needed?: No  Information entered by :: NAllen LPN  Past Medical History:  Diagnosis Date  . Abdominal distension   . Abdominal pain   . Arthritis    hands, feet  . Back  pain with radiation   . Cancer ([place])    right breast  . Cough   . Dysrhythmia    rare palpitation  . GERD (gastroesophageal reflux disease)   . Hypothyroidism   . Low back pain with sciatica   . Lymph nodes enlarged   . Nasal congestion   . Palpitations   . Seizures ([place])    phenergan caused it  . Sore throat    Past Surgical History:  Procedure Laterality Date  . APPENDECTOMY  10/03/11  . BACK SURGERY     lumbar fusion  . BREAST LUMPECTOMY WITH SENTINEL LYMPH NODE BIOPSY Right 04/24/2015   Procedure: RIGHT BREAST LUMPECTOMY WITH SENTINEL LYMPH NODE BIOPSY;  Surgeon: [name], MD;  Location: [facility];  Service: General;  Laterality: Right;  . CESAREAN SECTION  09/09/81  . LAPAROSCOPIC APPENDECTOMY  10/03/2011   Procedure: APPENDECTOMY LAPAROSCOPIC;  Surgeon: Zenovia Jarred, MD;  Location: [facility];  Service: General;  Laterality: N/A;  . [place] - approximate   Family History  Problem Relation Age of Onset  . Emphysema Father    Social History   Socioeconomic History  . Marital status: Married    Spouse name: Not on file  . Number of children: Not on file  . Years of education: Not on file  . Highest education level: Not on file  Occupational History  . Occupation:  retired  Scientific laboratory technician  . Financial resource strain: Not hard at all  . Food insecurity    Worry: Never true    Inability: Never true  . Transportation needs    Medical: No    Non-medical: No  Tobacco Use  . Smoking status: Never Smoker  . Smokeless tobacco: Never Used  Substance and Sexual Activity  . Alcohol use: Not Currently  . Drug use: No  . Sexual activity: Not Currently  Lifestyle  . Physical activity    Days per week: 7 days    Minutes per session: 30 min  . Stress: Only a little  Relationships  . Social Herbalist on phone: Not on file    Gets together: Not on file    Attends religious service: Not on file    Active member of club or  organization: Not on file    Attends meetings of clubs or organizations: Not on file    Relationship status: Not on file  Other Topics Concern  . Not on file  Social History Narrative  . Not on file    Outpatient Encounter Medications as of 10/20/2018  Medication Sig  . Artificial Tear Solution (BION TEARS OP) Apply 1 drop to eye 3 (three) times daily as needed. Dry eyes  . Ascorbic Acid (VITAMIN C) POWD Take 2.5 g by mouth 2 (two) times daily.  . Cholecalciferol (VITAMIN D PO) Take 4 drops by mouth daily.  . Digestive Enzymes CAPS Take 1 capsule by mouth 3 (three) times daily with meals.  Marland Kitchen MAGNESIUM PO Take 1 tablet by mouth daily.  . Misc Natural Products (ADRENAL PO) Take 1 tablet by mouth daily.  . Multiple Vitamin (MULTIVITAMIN WITH MINERALS) TABS Take 1 tablet by mouth daily.  . sodium chloride (OCEAN) 0.65 % nasal spray Place 1 spray into the nose daily as needed. For congestion.  Marland Kitchen UNABLE TO [place] Salt  . UNABLE TO FIND Med Name: iodine 12.5 mg twice daily  . vitamin A 25000 UNIT capsule Take 25,000 Units by mouth 2 (two) times daily.  . Zinc 50 MG CAPS Take 50 mg by mouth daily.  Marland Kitchen zolpidem (AMBIEN) 10 MG tablet Take 1 tablet (10 mg total) by mouth at bedtime as needed for sleep.  Francia Greaves THYROID 60 MG tablet Take 1 tablet by mouth once daily (Patient not taking: Reported on 10/20/2018)  . CALCIUM PO Take 1-2 tablets by mouth 2 (two) times daily. Take 1 tablet every morning and take 2 tablets every evening.  . COLLAGEN PO Take by mouth.  Marland Kitchen HYDROcodone-acetaminophen (NORCO/VICODIN) 5-325 MG tablet Take 1-2 tablets by mouth every 4 (four) hours as needed for moderate pain or severe pain. (Patient not taking: Reported on 10/20/2018)  . Omega-3 Fatty Acids (FISH OIL PO) Take 20 mLs by mouth 2 (two) times daily.  Marland Kitchen OVER THE COUNTER MEDICATION Apply 1 application topically every morning. Source Naturals Progesterone Cream.  . OVER THE COUNTER MEDICATION Take 1 tablet by  mouth daily. Pau d'Arco Tablets.  Cecille Amsterdam D Arco 1000 MG CAPS Take by mouth daily.  . Probiotic Product (PROBIOTIC DAILY PO) Take 1 capsule by mouth daily.  Marland Kitchen thyroid (ARMOUR) 120 MG tablet Take 120 mg by mouth daily.  Marland Kitchen Ubiquinol 100 MG CAPS Take by mouth.  Marland Kitchen UNABLE TO FIND Hemagenics 29 mg daily  . UNABLE TO FIND Argentyn 23  . UNABLE TO FIND Med Name: Alphalipoic Acid 200mg  once daily.  Marland Kitchen  UNABLE TO FIND Med Name: Glutathione, 500 mg once daily.  . Vaginal Lubricant (REPLENS) GEL Place vaginally Nightly.   No facility-administered encounter medications on file as of 10/20/2018.     Activities of Daily Living In your present state of health, do you have any difficulty performing the following activities: 10/20/2018  Hearing? N  Vision? Y  Comment vision gets cloudy  Difficulty concentrating or making decisions? Y  Comment only when fatigued  Walking or climbing stairs? Y  Comment due to stamina  Dressing or bathing? N  Doing errands, shopping? N  Preparing Food and eating ? N  Using the Toilet? N  In the past six months, have you accidently leaked urine? N  Do you have problems with loss of bowel control? N  Managing your Medications? N  Managing your Finances? N  Housekeeping or managing your Housekeeping? N  Some recent data might be hidden    Patient Care Team: Minette Brine, FNP as PCP - General (General Practice)    Assessment:   This is a routine wellness examination for [place].  Exercise Activities and Dietary recommendations Current Exercise Habits: Home exercise routine, Type of exercise: yoga;stretching, Time (Minutes): 30, Frequency (Times/Week): 7, Weekly Exercise (Minutes/Week): 210  Goals    . Gain weight     10/20/2018, wants to get to 120-125 pounds       Fall Risk Fall Risk  10/20/2018 04/14/2018 02/10/2018 10/22/2017 05/22/2015  Falls in the past year? 1 0 0 No No  Comment - - Emmi Telephone Survey: data to providers prior to load Emmi Telephone  Survey: data to providers prior to load -  Number falls in past yr: 1 - - - -  Comment tripped over wire, tripped over a bottle of water - - - -  Injury with Fall? 1 0 - - -  Comment injured instep and ankle - - - -  Risk for fall due to : History of fall(s);Impaired mobility;Medication side effect - - - -  Follow up Falls evaluation completed;Education provided;Falls prevention discussed - - - -   Is the patient's home free of loose throw rugs in walkways, pet beds, electrical cords, etc?   yes      Grab bars in the bathroom? no      Handrails on the stairs?   n/a      Adequate lighting?   yes  Timed Get Up and Go performed: n/a  Depression Screen PHQ 2/9 Scores 10/20/2018 04/14/2018 05/22/2015  PHQ - 2 Score 0 0 0  PHQ- 9 Score 3 - -     Cognitive Function     6CIT Screen 10/20/2018  What Year? 0 points  What month? 0 points  What time? 0 points  Count back from 20 0 points  Months in reverse 0 points  Repeat phrase 0 points  Total Score 0     There is no immunization history on file for this patient.  Qualifies for Shingles Vaccine? yes  Screening Tests Health Maintenance  Topic Date Due  . Hepatitis C Screening  Jan 25, 1951  . MAMMOGRAM  10/20/2019 (Originally 03/28/2017)  . DEXA SCAN  10/20/2019 (Originally 11/21/2016)  . COLONOSCOPY  10/20/2019 (Originally 11/21/2001)  . TETANUS/TDAP  10/20/2019 (Originally 11/22/1970)  . PNA vac Low Risk Adult (1 of 2 - PCV13) 10/20/2019 (Originally 11/21/2016)  . INFLUENZA VACCINE  10/22/2018    Cancer Screenings: Lung: Low Dose CT Chest recommended if Age 86-80 years, 30 pack-year currently smoking OR have  quit w/in 15years. Patient does not qualify. Breast:  Up to date on Mammogram? No   Up to date of Bone Density/Dexa? No Colorectal: declines  Additional Screenings: : Hepatitis C Screening: due     Plan:    6 CIT was 0. Patient wants to weigh 120-125 pounds.   I have personally reviewed and noted the following in the  patient's chart:   . Medical and social history . Use of alcohol, tobacco or illicit drugs  . Current medications and supplements . Functional ability and status . Nutritional status . Physical activity . Advanced directives . List of other physicians . Hospitalizations, surgeries, and ER visits in previous 12 months . Vitals . Screenings to include cognitive, depression, and falls . Referrals and appointments  In addition, I have reviewed and discussed with patient certain preventive protocols, quality metrics, and best practice recommendations. A written personalized care plan for preventive services as well as general preventive health recommendations were provided to patient.     Kellie Simmering, LPN  05/04/2480

## 2018-10-20 NOTE — Progress Notes (Signed)
Subjective:     Patient ID: Virginia [name] , female    DOB: 1950-08-23 , 68 y.o.   MRN: [id number]   Chief Complaint  Patient presents with  . Hypothyroidism    HPI Pt is here for Medicare wellness visit with our LPN and to follow up on her thyroid condition. She was having hyperthyroid symptoms which she is familiar with in [name] and reduced her  thyroid med to 30 mg from 60 mg qd, but only for one week then d/c this. She went to see her Naturopath in June who ordered thyroid labs, but pt has not called her back for her results. Her hyperthyroid symptoms has resolved. She was only off her thyroid med for a few week at that time.  She decided to place herself on a liver and ebstein bar protocol diet in February slowly.  She had already been loosing wt due to liver cleansing and dietary protochol. She does not eat any meat due to having problems with digestion, so she eats fruits, vegetables, grains, and no meat.  She does not see an oncologist for Fu of her breast CA, only sees her Naturopath for cancer support. Never had a pet scan.   Past Medical History:  Diagnosis Date  . Abdominal distension   . Abdominal pain   . Arthritis    hands, feet  . Back pain with radiation   . Cancer ([place])    right breast  . Cough   . Dysrhythmia    rare palpitation  . GERD (gastroesophageal reflux disease)   . Hypothyroidism   . Low back pain with sciatica   . Lymph nodes enlarged   . Nasal congestion   . Palpitations   . Seizures ([place])    phenergan caused it  . Sore throat      Family History  Problem Relation Age of Onset  . Emphysema Father      Current Outpatient Medications:  .  ARMOUR THYROID 60 MG tablet, Take 1 tablet by mouth once daily (Patient not taking: Reported on 10/20/2018), Disp: 90 tablet, Rfl: 0 .  Artificial Tear Solution (BION TEARS OP), Apply 1 drop to eye 3 (three) times daily as needed. Dry eyes, Disp: , Rfl:  .  Ascorbic Acid (VITAMIN C) POWD, Take 2.5 g by mouth  2 (two) times daily., Disp: , Rfl:  .  CALCIUM PO, Take 1-2 tablets by mouth 2 (two) times daily. Take 1 tablet every morning and take 2 tablets every evening., Disp: , Rfl:  .  Cholecalciferol (VITAMIN D PO), Take 4 drops by mouth daily., Disp: , Rfl:  .  COLLAGEN PO, Take by mouth., Disp: , Rfl:  .  Digestive Enzymes CAPS, Take 1 capsule by mouth 3 (three) times daily with meals., Disp: , Rfl:  .  HYDROcodone-acetaminophen (NORCO/VICODIN) 5-325 MG tablet, Take 1-2 tablets by mouth every 4 (four) hours as needed for moderate pain or severe pain. (Patient not taking: Reported on 10/20/2018), Disp: 30 tablet, Rfl: 0 .  MAGNESIUM PO, Take 1 tablet by mouth daily., Disp: , Rfl:  .  Misc Natural Products (ADRENAL PO), Take 1 tablet by mouth daily., Disp: , Rfl:  .  Multiple Vitamin (MULTIVITAMIN WITH MINERALS) TABS, Take 1 tablet by mouth daily., Disp: , Rfl:  .  Omega-3 Fatty Acids (FISH OIL PO), Take 20 mLs by mouth 2 (two) times daily., Disp: , Rfl:  .  OVER THE COUNTER MEDICATION, Apply 1 application topically every morning. Source  Naturals Progesterone Cream., Disp: , Rfl:  .  OVER THE COUNTER MEDICATION, Take 1 tablet by mouth daily. Pau d'Arco Tablets., Disp: , Rfl:  .  Pau D Arco 1000 MG CAPS, Take by mouth daily., Disp: , Rfl:  .  Probiotic Product (PROBIOTIC DAILY PO), Take 1 capsule by mouth daily., Disp: , Rfl:  .  sodium chloride (OCEAN) 0.65 % nasal spray, Place 1 spray into the nose daily as needed. For congestion., Disp: , Rfl:  .  thyroid (ARMOUR) 120 MG tablet, Take 120 mg by mouth daily., Disp: , Rfl:  .  Ubiquinol 100 MG CAPS, Take by mouth., Disp: , Rfl:  .  UNABLE TO FIND, Hemagenics 29 mg daily, Disp: , Rfl:  .  UNABLE TO FIND, Argentyn 23, Disp: , Rfl:  .  UNABLE TO FIND, Celtic Sea Salt, Disp: , Rfl:  .  UNABLE TO FIND, Med Name: iodine 12.5 mg twice daily, Disp: , Rfl:  .  UNABLE TO FIND, Med Name: Alphalipoic Acid 200mg  once daily., Disp: , Rfl:  .  UNABLE TO FIND, Med  Name: Glutathione, 500 mg once daily., Disp: , Rfl:  .  Vaginal Lubricant (REPLENS) GEL, Place vaginally Nightly., Disp: , Rfl:  .  vitamin A 25000 UNIT capsule, Take 25,000 Units by mouth 2 (two) times daily., Disp: , Rfl:  .  Zinc 50 MG CAPS, Take 50 mg by mouth daily., Disp: , Rfl:  .  zolpidem (AMBIEN) 10 MG tablet, Take 1 tablet (10 mg total) by mouth at bedtime as needed for sleep., Disp: 30 tablet, Rfl: 0   Allergies  Allergen Reactions  . Antihistamines, Diphenhydramine-Type Other (See Comments)    Dehydration and fatigue   . Ofirmev [Acetaminophen] Other (See Comments)    Drop in BP  . Phenergan [Promethazine] Other (See Comments)    Thinks it was a seizure  . Scopolamine Nausea And Vomiting     Review of Systems  Has trouble digesting proteins and fat, has chronic fatigue and had no stamina or endurance prior to her protocol diet, also has had lots of myalgia after exercise and her would not resolve for several days, but now is gradually getting better. Has intermittent chronic constipation and if she increases vit C and fluids like juicing helps.  She gets Nagly's test for her breast CA follow up, she does not see an oncologist, only her naturopath.  Today's Vitals   10/20/18 1444  BP: 102/68  Pulse: 68  Temp: 98.4 F (36.9 C)  TempSrc: Oral  Weight: 107 lb 9.6 oz (48.8 kg)  Height: 5\' 6"  (1.676 m)  PainSc: 0-No pain   Body mass index is 17.37 kg/m.   Objective:  Physical Exam   Constitutional: She is oriented to person, place, and time. She looks anorexic.Is in No distress.  HENT:  Head: Normocephalic and atraumatic.  Right Ear: External ear normal.  Left Ear: External ear normal.  Nose: Nose normal.  Eyes: Conjunctivae are normal. Right eye exhibits no discharge. Left eye exhibits no discharge. No scleral icterus.  Neck: Neck supple. No thyromegaly present.  No carotid bruits bilaterally  Cardiovascular: Normal rate and regular rhythm.  No murmur  heard. Pulmonary/Chest: Effort normal and breath sounds normal. No respiratory distress.  Musculoskeletal: Normal range of motion. She exhibits no edema.  Lymphadenopathy:    She has no cervical adenopathy.  Neurological: She is alert and oriented to person, place, and time. No tremor noted.  Skin: Skin is warm and dry.  Capillary refill takes less than 2 seconds. No rash noted. She is not diaphoretic.  Psychiatric: She has a normal mood and affect. Her behavior is normal. Judgment and thought content normal.  Nursing note reviewed.   Assessment And Plan:    1. Other specified hypothyroidism- currently on no medication - CBC no Diff - CMP14 + Anion Gap - T3, reverse  2. Unintended weight loss- gradual since change in diet.  - TSH - T4, Free - T3, free - CBC no Diff - CMP14 + Anion Gap  3- Personal hx of breast cancer- not following up with oncologist per per personal choice and wants to continue her current care with her Naturopath.  I will inform her of her lab results via [facility] and since her pt has chosen for her Naturopath to FU for her Thyroid, she will fax her a copy of what I order.  Victormanuel Mclure RODRIGUEZ-SOUTHWORTH, PA-C    THE PATIENT IS ENCOURAGED TO PRACTICE SOCIAL DISTANCING DUE TO THE COVID-19 PANDEMIC.

## 2018-10-23 LAB — CMP14 + ANION GAP
ALT: 29 IU/L (ref 0–32)
AST: 26 IU/L (ref 0–40)
Albumin/Globulin Ratio: 2.6 — ABNORMAL HIGH (ref 1.2–2.2)
Albumin: 4.5 g/dL (ref 3.8–4.8)
Alkaline Phosphatase: 125 IU/L — ABNORMAL HIGH (ref 39–117)
Anion Gap: 13 mmol/L (ref 10.0–18.0)
BUN/Creatinine Ratio: 5 — ABNORMAL LOW (ref 12–28)
BUN: 3 mg/dL — ABNORMAL LOW (ref 8–27)
Bilirubin Total: 0.3 mg/dL (ref 0.0–1.2)
CO2: 24 mmol/L (ref 20–29)
Calcium: 9.4 mg/dL (ref 8.7–10.3)
Chloride: 100 mmol/L (ref 96–106)
Creatinine, Ser: 0.56 mg/dL — ABNORMAL LOW (ref 0.57–1.00)
GFR calc Af Amer: 112 mL/min/{1.73_m2} (ref >59)
GFR calc non Af Amer: 97 mL/min/{1.73_m2} (ref >59)
Globulin, Total: 1.7 g/dL (ref 1.5–4.5)
Glucose: 106 mg/dL — ABNORMAL HIGH (ref 65–99)
Potassium: 4.7 mmol/L (ref 3.5–5.2)
Sodium: 137 mmol/L (ref 134–144)
Total Protein: 6.2 g/dL (ref 6.0–8.5)

## 2018-10-23 LAB — T4, FREE: Free T4: 0.93 ng/dL (ref 0.82–1.77)

## 2018-10-23 LAB — CBC
Hematocrit: 38.2 % (ref 34.0–46.6)
Hemoglobin: 12.8 g/dL (ref 11.1–15.9)
MCH: 31 pg (ref 26.6–33.0)
MCHC: 33.5 g/dL (ref 31.5–35.7)
MCV: 93 fL (ref 79–97)
Platelets: 233 10*3/uL (ref 150–450)
RBC: 4.13 x10E6/uL (ref 3.77–5.28)
RDW: 12 % (ref 11.7–15.4)
WBC: 3.8 10*3/uL (ref 3.4–10.8)

## 2018-10-23 LAB — T3, FREE: T3, Free: 2 pg/mL (ref 2.0–4.4)

## 2018-10-23 LAB — T3, REVERSE: Reverse T3, Serum: 13 ng/dL (ref 9.2–24.1)

## 2018-10-23 LAB — TSH: TSH: 2.29 u[IU]/mL (ref 0.450–4.500)

## 2018-12-21 ENCOUNTER — Other Ambulatory Visit: Payer: Self-pay | Admitting: Nurse Practitioner

## 2018-12-21 DIAGNOSIS — G4709 Other insomnia: Secondary | ICD-10-CM

## 2018-12-21 NOTE — Telephone Encounter (Signed)
ambien refill  

## 2019-01-02 ENCOUNTER — Ambulatory Visit (INDEPENDENT_AMBULATORY_CARE_PROVIDER_SITE_OTHER): Payer: Medicare Other | Admitting: Nurse Practitioner

## 2019-01-02 ENCOUNTER — Other Ambulatory Visit: Payer: Self-pay

## 2019-01-02 VITALS — BP 100/68 | HR 61 | Temp 98.2°F | Ht 66.0 in | Wt 111.4 lb

## 2019-01-02 DIAGNOSIS — G4709 Other insomnia: Secondary | ICD-10-CM

## 2019-01-02 DIAGNOSIS — Z853 Personal history of malignant neoplasm of breast: Secondary | ICD-10-CM

## 2019-01-02 MED ORDER — ZOLPIDEM TARTRATE 5 MG PO TABS: 5.0000 mg | ORAL_TABLET | Freq: Every evening | ORAL | 5 refills | Status: DC | PRN

## 2019-01-02 NOTE — Progress Notes (Signed)
Subjective:     Patient ID: Virginia [name] , female    DOB: 11-Apr-1950 , 68 y.o.   MRN: [id number]   Chief Complaint  Patient presents with  . Insomnia    HPI  Wt Readings from Last 3 Encounters: 01/02/19 : 111 lb 6.4 oz (50.5 kg) 10/20/18 : 107 lb 9.6 oz (48.8 kg) 10/20/18 : 107 lb 9.6 oz (48.8 kg)  She did not receive radiation or chemotherapy.  Right breast cancer.  Wanted to do a thermascan.  Dr. Brooks Sailors feels she does not have the signs of a special test for vitamin d uptake.  She has a lot of viral overload.  She has a history of epstein barr and lyme disease in the past.  She does not have any markers of metastasis - she seen her last in December 2019 or January 2020, she plans to see her in the near future.    Insomnia Primary symptoms: difficulty falling asleep (almost an hour).  Exacerbated by: post menopausal. How many beverages per day that contain caffeine: 0 - 1 (occasionally she will do 1/2 and 1/2 or decaff).  Types of beverages you drink: coffee and tea. Typical bedtime:  Other (1-130am due to her chronic fatigue. ).  PMH includes: no hypertension, no depression, chronic pain (sciatica pain, had back surgery in 2013 did not get rid of it - thinks is nerve damage. she is doing gentle yoga. ).     Past Medical History:  Diagnosis Date  . Abdominal distension   . Abdominal pain   . Arthritis    hands, feet  . Back pain with radiation   . Cancer ([place])    right breast  . Cough   . Dysrhythmia    rare palpitation  . GERD (gastroesophageal reflux disease)   . Hypothyroidism   . Low back pain with sciatica   . Lymph nodes enlarged   . Nasal congestion   . Palpitations   . Seizures ([place])    phenergan caused it  . Sore throat      Family History  Problem Relation Age of Onset  . Emphysema Father      Current Outpatient Medications:  .  Ascorbic Acid (VITAMIN C) POWD, Take 2.5 g by mouth 2 (two) times daily., Disp: , Rfl:  .  CALCIUM PO, Take 1-2 tablets  by mouth 2 (two) times daily. Take 1 tablet every morning and take 2 tablets every evening., Disp: , Rfl:  .  Cholecalciferol (VITAMIN D PO), Take 4 drops by mouth daily., Disp: , Rfl:  .  Digestive Enzymes CAPS, Take 1 capsule by mouth 3 (three) times daily with meals., Disp: , Rfl:  .  MAGNESIUM PO, Take 1 tablet by mouth daily., Disp: , Rfl:  .  Misc Natural Products (ADRENAL PO), Take 1 tablet by mouth daily., Disp: , Rfl:  .  Multiple Vitamin (MULTIVITAMIN WITH MINERALS) TABS, Take 1 tablet by mouth daily., Disp: , Rfl:  .  Pau D Arco 1000 MG CAPS, Take by mouth daily., Disp: , Rfl:  .  sodium chloride (OCEAN) 0.65 % nasal spray, Place 1 spray into the nose daily as needed. For congestion., Disp: , Rfl:  .  UNABLE TO FIND, Argentyn 23, Disp: , Rfl:  .  UNABLE TO FIND, Celtic Sea Salt, Disp: , Rfl:  .  UNABLE TO FIND, Med Name: iodine 12.5 mg twice daily, Disp: , Rfl:  .  UNABLE TO FIND, Med Name: Alphalipoic Acid 200mg   once daily., Disp: , Rfl:  .  vitamin A 25000 UNIT capsule, Take 25,000 Units by mouth 2 (two) times daily., Disp: , Rfl:  .  Zinc 50 MG CAPS, Take 50 mg by mouth daily., Disp: , Rfl:  .  zolpidem (AMBIEN) 10 MG tablet, Take 1 tablet (10 mg total) by mouth at bedtime as needed for sleep., Disp: 30 tablet, Rfl: 0 .  Ubiquinol 100 MG CAPS, Take by mouth., Disp: , Rfl:  .  UNABLE TO FIND, Med Name: Glutathione, 500 mg once daily., Disp: , Rfl:  .  Vaginal Lubricant (REPLENS) GEL, Place vaginally Nightly., Disp: , Rfl:    Allergies  Allergen Reactions  . Antihistamines, Diphenhydramine-Type Other (See Comments)    Dehydration and fatigue   . Ofirmev [Acetaminophen] Other (See Comments)    Drop in BP  . Phenergan [Promethazine] Other (See Comments)    Thinks it was a seizure  . Scopolamine Nausea And Vomiting     Review of Systems  Constitutional: Negative.   Respiratory: Negative.   Cardiovascular: Negative.  Negative for chest pain, palpitations and leg swelling.   Genitourinary: Negative.   Neurological: Negative.   Hematological: Negative.   Psychiatric/Behavioral: Negative for depression. The patient has insomnia.      Today's Vitals   01/02/19 1418  BP: 100/68  Pulse: 61  Temp: 98.2 F (36.8 C)  TempSrc: Oral  Weight: 111 lb 6.4 oz (50.5 kg)  Height: 5\' 6"  (1.676 m)   Body mass index is 17.98 kg/m.   Objective:  Physical Exam Constitutional:      Appearance: Normal appearance.  Cardiovascular:     Rate and Rhythm: Normal rate and regular rhythm.     Pulses: Normal pulses.     Heart sounds: Normal heart sounds. No murmur.  Pulmonary:     Effort: Pulmonary effort is normal.     Breath sounds: Normal breath sounds.  Skin:    Capillary Refill: Capillary refill takes less than 2 seconds.  Neurological:     General: No focal deficit present.     Mental Status: She is alert and oriented to person, place, and time.  Psychiatric:        Mood and Affect: Mood normal.        Behavior: Behavior normal.        Thought Content: Thought content normal.        Judgment: Judgment normal.         Assessment And Plan:     1. Other insomnia  Chronic,   Doing well with ambien and no side effects - zolpidem (AMBIEN) 5 MG tablet; Take 1 tablet (5 mg total) by mouth at bedtime as needed for sleep.  Dispense: 30 tablet; Refill: 5  2. History of right breast cancer  She is to follow up with her [facility] provider  She is planning to schedule a Thermascan   Minette Brine, FNP    THE PATIENT IS ENCOURAGED TO PRACTICE SOCIAL DISTANCING DUE TO THE COVID-19 PANDEMIC.

## 2019-01-08 ENCOUNTER — Encounter: Payer: Self-pay | Admitting: Nurse Practitioner

## 2019-04-11 DIAGNOSIS — D3132 Benign neoplasm of left choroid: Secondary | ICD-10-CM | POA: Diagnosis not present

## 2019-04-11 DIAGNOSIS — H2513 Age-related nuclear cataract, bilateral: Secondary | ICD-10-CM | POA: Diagnosis not present

## 2019-04-24 ENCOUNTER — Ambulatory Visit: Payer: Medicare Other | Admitting: Nurse Practitioner

## 2019-07-11 ENCOUNTER — Other Ambulatory Visit: Payer: Self-pay | Admitting: Nurse Practitioner

## 2019-07-11 DIAGNOSIS — G4709 Other insomnia: Secondary | ICD-10-CM

## 2019-07-12 ENCOUNTER — Other Ambulatory Visit: Payer: Self-pay | Admitting: Nurse Practitioner

## 2019-07-12 DIAGNOSIS — G4709 Other insomnia: Secondary | ICD-10-CM

## 2019-07-13 ENCOUNTER — Other Ambulatory Visit: Payer: Self-pay

## 2019-07-13 ENCOUNTER — Ambulatory Visit (INDEPENDENT_AMBULATORY_CARE_PROVIDER_SITE_OTHER): Payer: Medicare Other | Admitting: Nurse Practitioner

## 2019-07-13 ENCOUNTER — Encounter: Payer: Self-pay | Admitting: Nurse Practitioner

## 2019-07-13 VITALS — BP 112/68 | HR 64 | Temp 97.9°F | Ht 66.0 in | Wt 111.6 lb

## 2019-07-13 DIAGNOSIS — G4709 Other insomnia: Secondary | ICD-10-CM | POA: Diagnosis not present

## 2019-07-13 DIAGNOSIS — Z1159 Encounter for screening for other viral diseases: Secondary | ICD-10-CM

## 2019-07-13 DIAGNOSIS — Z853 Personal history of malignant neoplasm of breast: Secondary | ICD-10-CM

## 2019-07-13 MED ORDER — ZOLPIDEM TARTRATE 5 MG PO TABS: ORAL_TABLET | ORAL | 5 refills | Status: DC

## 2019-07-13 NOTE — Progress Notes (Signed)
Subjective:     Patient ID: Virginia [name] , female    DOB: 11-Apr-1950 , 69 y.o.   MRN: [id number]   Chief Complaint  Patient presents with  . Insomnia    HPI  Wt Readings from Last 3 Encounters: 07/13/19 : 111 lb 9.6 oz (50.6 kg) 01/02/19 : 111 lb 6.4 oz (50.5 kg) 10/20/18 : 107 lb 9.6 oz (48.8 kg)   She is taking melatonin, tea with camimille, valarian. She is taking 1/2 tablet of ambien.  She feels that when she was taking a 1/4 of tab she felt it was holding her better.   She had been seeing a provider in [place] for her cancer naturopath. She feels like after her blood drawn  Insomnia Primary symptoms: difficulty falling asleep (almost an hour).  The onset quality is sudden. The problem occurs nightly. Exacerbated by: post menopausal. How many beverages per day that contain caffeine: 0 - 1 (occasionally she will do 1/2 and 1/2 or decaff).  Types of beverages you drink: coffee and tea. Typical bedtime:  Other (1-130am due to her chronic fatigue. ).  PMH includes: no hypertension, no depression, chronic pain (sciatica pain, had back surgery in 2013 did not get rid of it - thinks is nerve damage. she is doing gentle yoga. ).     Past Medical History:  Diagnosis Date  . Abdominal distension   . Abdominal pain   . Arthritis    hands, feet  . Back pain with radiation   . Cancer ([place])    right breast  . Cough   . Dysrhythmia    rare palpitation  . GERD (gastroesophageal reflux disease)   . Hypothyroidism   . Low back pain with sciatica   . Lymph nodes enlarged   . Nasal congestion   . Palpitations   . Seizures ([place])    phenergan caused it  . Sore throat      Family History  Problem Relation Age of Onset  . Emphysema Father      Current Outpatient Medications:  .  Ascorbic Acid (VITAMIN C) POWD, Take 2.5 g by mouth 2 (two) times daily., Disp: , Rfl:  .  Cholecalciferol (VITAMIN D PO), Take 4 drops by mouth daily., Disp: , Rfl:  .  MAGNESIUM PO, Take 1  tablet by mouth daily., Disp: , Rfl:  .  Misc Natural Products (ADRENAL PO), Take 1 tablet by mouth daily., Disp: , Rfl:  .  Multiple Vitamin (MULTIVITAMIN WITH MINERALS) TABS, Take 1 tablet by mouth daily., Disp: , Rfl:  .  Zinc 50 MG CAPS, Take 50 mg by mouth daily., Disp: , Rfl:  .  zolpidem (AMBIEN) 5 MG tablet, Take 1 tablet (5 mg total) by mouth at bedtime as needed for sleep., Disp: 30 tablet, Rfl: 5 .  CALCIUM PO, Take 1-2 tablets by mouth 2 (two) times daily. Take 1 tablet every morning and take 2 tablets every evening., Disp: , Rfl:  .  Digestive Enzymes CAPS, Take 1 capsule by mouth 3 (three) times daily with meals., Disp: , Rfl:  .  Pau D Arco 1000 MG CAPS, Take by mouth daily., Disp: , Rfl:  .  sodium chloride (OCEAN) 0.65 % nasal spray, Place 1 spray into the nose daily as needed. For congestion., Disp: , Rfl:  .  Ubiquinol 100 MG CAPS, Take by mouth., Disp: , Rfl:  .  UNABLE TO FIND, Argentyn 23, Disp: , Rfl:  .  UNABLE TO FIND, Celtic  Sea Salt, Disp: , Rfl:  .  UNABLE TO FIND, Med Name: iodine 12.5 mg twice daily, Disp: , Rfl:  .  UNABLE TO FIND, Med Name: Alphalipoic Acid 200mg  once daily., Disp: , Rfl:  .  UNABLE TO FIND, Med Name: Glutathione, 500 mg once daily., Disp: , Rfl:  .  Vaginal Lubricant (REPLENS) GEL, Place vaginally Nightly., Disp: , Rfl:  .  vitamin A 25000 UNIT capsule, Take 25,000 Units by mouth 2 (two) times daily., Disp: , Rfl:    Allergies  Allergen Reactions  . Antihistamines, Diphenhydramine-Type Other (See Comments)    Dehydration and fatigue   . Ofirmev [Acetaminophen] Other (See Comments)    Drop in BP  . Phenergan [Promethazine] Other (See Comments)    Thinks it was a seizure  . Scopolamine Nausea And Vomiting     Review of Systems  Constitutional: Negative.   Respiratory: Negative.   Cardiovascular: Negative.  Negative for chest pain, palpitations and leg swelling.  Genitourinary: Negative.   Neurological: Negative.   Hematological:  Negative.   Psychiatric/Behavioral: Negative for depression. The patient has insomnia.      Today's Vitals   07/13/19 1500  BP: 112/68  Pulse: 64  Temp: 97.9 F (36.6 C)  TempSrc: Oral  SpO2: 96%  Weight: 111 lb 9.6 oz (50.6 kg)  Height: 5\' 6"  (1.676 m)   Body mass index is 18.01 kg/m.   Objective:  Physical Exam Constitutional:      Appearance: Normal appearance.  Cardiovascular:     Rate and Rhythm: Normal rate and regular rhythm.     Pulses: Normal pulses.     Heart sounds: Normal heart sounds. No murmur.  Pulmonary:     Effort: Pulmonary effort is normal.     Breath sounds: Normal breath sounds.  Skin:    Capillary Refill: Capillary refill takes less than 2 seconds.  Neurological:     General: No focal deficit present.     Mental Status: She is alert and oriented to person, place, and time.  Psychiatric:        Mood and Affect: Mood normal.        Behavior: Behavior normal.        Thought Content: Thought content normal.        Judgment: Judgment normal.         Assessment And Plan:     1. Other insomnia  Chronic,   Doing well with ambien and no side effects - zolpidem (AMBIEN) 5 MG tablet; Take 1 tablet (5 mg total) by mouth at bedtime as needed for sleep.  Dispense: 30 tablet; Refill: 5  2. History of right breast cancer  She is to follow up with her [facility] provider  She is planning to schedule a Thermascan  Discussed with patient the importance of rechecking her liver functions particularly alkaline phos due to her history of breast cancer. She is wanting to have the most minimum amount of blood in each vial due to her feeling like her "immune system is wiped out after having blood obtained" the phlebotomist explained  Would need to have the required amount of blood obtained to run test adequately, the patient left prior to having her blood drawn.   Minette Brine, FNP    THE PATIENT IS ENCOURAGED TO PRACTICE SOCIAL DISTANCING DUE TO THE  COVID-19 PANDEMIC.

## 2019-08-03 NOTE — Progress Notes (Signed)
Pt refused to come and get labs. YL,RMA

## 2019-09-11 ENCOUNTER — Encounter: Payer: Self-pay | Admitting: Nurse Practitioner

## 2019-10-26 ENCOUNTER — Other Ambulatory Visit: Payer: Self-pay

## 2019-10-26 ENCOUNTER — Encounter: Payer: Self-pay | Admitting: Nurse Practitioner

## 2019-10-26 ENCOUNTER — Ambulatory Visit (INDEPENDENT_AMBULATORY_CARE_PROVIDER_SITE_OTHER): Payer: Medicare Other

## 2019-10-26 ENCOUNTER — Ambulatory Visit (INDEPENDENT_AMBULATORY_CARE_PROVIDER_SITE_OTHER): Payer: Medicare Other | Admitting: Nurse Practitioner

## 2019-10-26 VITALS — BP 110/70 | HR 77 | Temp 98.0°F | Ht 65.2 in | Wt 109.1 lb

## 2019-10-26 VITALS — BP 110/70 | HR 77 | Temp 98.0°F | Ht 65.2 in | Wt 109.2 lb

## 2019-10-26 DIAGNOSIS — Z2821 Immunization not carried out because of patient refusal: Secondary | ICD-10-CM | POA: Diagnosis not present

## 2019-10-26 DIAGNOSIS — Z1159 Encounter for screening for other viral diseases: Secondary | ICD-10-CM | POA: Diagnosis not present

## 2019-10-26 DIAGNOSIS — M79642 Pain in left hand: Secondary | ICD-10-CM

## 2019-10-26 DIAGNOSIS — M79641 Pain in right hand: Secondary | ICD-10-CM

## 2019-10-26 DIAGNOSIS — Z0000 Encounter for general adult medical examination without abnormal findings: Secondary | ICD-10-CM | POA: Diagnosis not present

## 2019-10-26 DIAGNOSIS — G4709 Other insomnia: Secondary | ICD-10-CM

## 2019-10-26 NOTE — Patient Instructions (Signed)
Virginia [name] , Thank you for taking time to come for your Medicare Wellness Visit. I appreciate your ongoing commitment to your health goals. Please review the following plan we discussed and let me know if I can assist you in the future.   Screening recommendations/referrals: Colonoscopy: decline Mammogram: decline Bone Density: decline Recommended yearly ophthalmology/optometry visit for glaucoma screening and checkup Recommended yearly dental visit for hygiene and checkup  Vaccinations: Influenza vaccine: decline Pneumococcal vaccine: decline Tdap vaccine: decline Shingles vaccine: decline   Covid-19:decline  Advanced directives: Advance directive discussed with you today. I have provided a copy for you to complete at home and have notarized. Once this is complete please bring a copy in to our office so we can scan it into your chart.  Conditions/risks identified: none  Next appointment: Follow up in one year for your annual wellness visit    Preventive Care 65 Years and Older, Female Preventive care refers to lifestyle choices and visits with your health care provider that can promote health and wellness. What does preventive care include?  A yearly physical exam. This is also called an annual well check.  Dental exams once or twice a year.  Routine eye exams. Ask your health care provider how often you should have your eyes checked.  Personal lifestyle choices, including:  Daily care of your teeth and gums.  Regular physical activity.  Eating a healthy diet.  Avoiding tobacco and drug use.  Limiting alcohol use.  Practicing safe sex.  Taking low-dose aspirin every day.  Taking vitamin and mineral supplements as recommended by your health care provider. What happens during an annual well check? The services and screenings done by your health care provider during your annual well check will depend on your age, overall health, lifestyle risk factors, and family  history of disease. Counseling  Your health care provider may ask you questions about your:  Alcohol use.  Tobacco use.  Drug use.  Emotional well-being.  Home and relationship well-being.  Sexual activity.  Eating habits.  History of falls.  Memory and ability to understand (cognition).  Work and work Statistician.  Reproductive health. Screening  You may have the following tests or measurements:  Height, weight, and BMI.  Blood pressure.  Lipid and cholesterol levels. These may be checked every 5 years, or more frequently if you are over 34 years old.  Skin check.  Lung cancer screening. You may have this screening every year starting at age 15 if you have a 30-pack-year history of smoking and currently smoke or have quit within the past 15 years.  Fecal occult blood test (FOBT) of the stool. You may have this test every year starting at age 18.  Flexible sigmoidoscopy or colonoscopy. You may have a sigmoidoscopy every 5 years or a colonoscopy every 10 years starting at age 6.  Hepatitis C blood test.  Hepatitis B blood test.  Sexually transmitted disease (STD) testing.  Diabetes screening. This is done by checking your blood sugar (glucose) after you have not eaten for a while (fasting). You may have this done every 1-3 years.  Bone density scan. This is done to screen for osteoporosis. You may have this done starting at age 78.  Mammogram. This may be done every 1-2 years. Talk to your health care provider about how often you should have regular mammograms. Talk with your health care provider about your test results, treatment options, and if necessary, the need for more tests. Vaccines  Your health care  provider may recommend certain vaccines, such as:  Influenza vaccine. This is recommended every year.  Tetanus, diphtheria, and acellular pertussis (Tdap, Td) vaccine. You may need a Td booster every 10 years.  Zoster vaccine. You may need this after  age 40.  Pneumococcal 13-valent conjugate (PCV13) vaccine. One dose is recommended after age 51.  Pneumococcal polysaccharide (PPSV23) vaccine. One dose is recommended after age 12. Talk to your health care provider about which screenings and vaccines you need and how often you need them. This information is not intended to replace advice given to you by your health care provider. Make sure you discuss any questions you have with your health care provider. Document Released: 04/05/2015 Document Revised: 11/27/2015 Document Reviewed: 01/08/2015 Elsevier Interactive Patient Education  2017 [place] Prevention in the Home Falls can cause injuries. They can happen to people of all ages. There are many things you can do to make your home safe and to help prevent falls. What can I do on the outside of my home?  Regularly fix the edges of walkways and driveways and fix any cracks.  Remove anything that might make you trip as you walk through a door, such as a raised step or threshold.  Trim any bushes or trees on the path to your home.  Use bright outdoor lighting.  Clear any walking paths of anything that might make someone trip, such as rocks or tools.  Regularly check to see if handrails are loose or broken. Make sure that both sides of any steps have handrails.  Any raised decks and porches should have guardrails on the edges.  Have any leaves, snow, or ice cleared regularly.  Use sand or salt on walking paths during winter.  Clean up any spills in your garage right away. This includes oil or grease spills. What can I do in the bathroom?  Use night lights.  Install grab bars by the toilet and in the tub and shower. Do not use towel bars as grab bars.  Use non-skid mats or decals in the tub or shower.  If you need to sit down in the shower, use a plastic, non-slip stool.  Keep the floor dry. Clean up any water that spills on the floor as soon as it  happens.  Remove soap buildup in the tub or shower regularly.  Attach bath mats securely with double-sided non-slip rug tape.  Do not have throw rugs and other things on the floor that can make you trip. What can I do in the bedroom?  Use night lights.  Make sure that you have a light by your bed that is easy to reach.  Do not use any sheets or blankets that are too big for your bed. They should not hang down onto the floor.  Have a firm chair that has side arms. You can use this for support while you get dressed.  Do not have throw rugs and other things on the floor that can make you trip. What can I do in the kitchen?  Clean up any spills right away.  Avoid walking on wet floors.  Keep items that you use a lot in easy-to-reach places.  If you need to reach something above you, use a strong step stool that has a grab bar.  Keep electrical cords out of the way.  Do not use floor polish or wax that makes floors slippery. If you must use wax, use non-skid floor wax.  Do not have throw  rugs and other things on the floor that can make you trip. What can I do with my stairs?  Do not leave any items on the stairs.  Make sure that there are handrails on both sides of the stairs and use them. Fix handrails that are broken or loose. Make sure that handrails are as long as the stairways.  Check any carpeting to make sure that it is firmly attached to the stairs. Fix any carpet that is loose or worn.  Avoid having throw rugs at the top or bottom of the stairs. If you do have throw rugs, attach them to the floor with carpet tape.  Make sure that you have a light switch at the top of the stairs and the bottom of the stairs. If you do not have them, ask someone to add them for you. What else can I do to help prevent falls?  Wear shoes that:  Do not have high heels.  Have rubber bottoms.  Are comfortable and fit you well.  Are closed at the toe. Do not wear sandals.  If you  use a stepladder:  Make sure that it is fully opened. Do not climb a closed stepladder.  Make sure that both sides of the stepladder are locked into place.  Ask someone to hold it for you, if possible.  Clearly mark and make sure that you can see:  Any grab bars or handrails.  First and last steps.  Where the edge of each step is.  Use tools that help you move around (mobility aids) if they are needed. These include:  Canes.  Walkers.  Scooters.  Crutches.  Turn on the lights when you go into a dark area. Replace any light bulbs as soon as they burn out.  Set up your furniture so you have a clear path. Avoid moving your furniture around.  If any of your floors are uneven, fix them.  If there are any pets around you, be aware of where they are.  Review your medicines with your doctor. Some medicines can make you feel dizzy. This can increase your chance of falling. Ask your doctor what other things that you can do to help prevent falls. This information is not intended to replace advice given to you by your health care provider. Make sure you discuss any questions you have with your health care provider. Document Released: 01/03/2009 Document Revised: 08/15/2015 Document Reviewed: 04/13/2014 Elsevier Interactive Patient Education  2017 [employer].

## 2019-10-26 NOTE — Progress Notes (Signed)
This visit occurred during the SARS-CoV-2 public health emergency.  Safety protocols were in place, including screening questions prior to the visit, additional usage of staff PPE, and extensive cleaning of exam room while observing appropriate contact time as indicated for disinfecting solutions.  Subjective:   Virginia [name] is a 69 y.o. female who presents for Medicare Annual (Subsequent) preventive examination.  Review of Systems     Cardiac Risk Factors include: advanced age (>34men, >84 women)     Objective:    Today's Vitals   10/26/19 1411  BP: 110/70  Pulse: 77  Temp: 98 F (36.7 C)  TempSrc: Oral  Weight: 109 lb 3.2 oz (49.5 kg)  Height: 5' 5.2" (1.656 m)   Body mass index is 18.06 kg/m.  Advanced Directives 10/26/2019 10/20/2018 05/22/2015 04/24/2015 04/19/2015 04/15/2015 02/12/2012  Does Patient Have a Medical Advance Directive? No No No No No Yes Patient does not have advance directive  Copy of [place] in Chart? - - - No - copy requested No - copy requested - -  Would patient like information on creating a medical advance directive? Yes (MAU/Ambulatory/Procedural Areas - Information given) Yes (MAU/Ambulatory/Procedural Areas - Information given) Yes - Educational materials given No - patient declined information - - -  Pre-existing out of facility DNR order (yellow form or pink MOST form) - - - - - - -    Current Medications (verified) Outpatient Encounter Medications as of 10/26/2019  Medication Sig  . Ascorbic Acid (VITAMIN C) POWD Take 2.5 g by mouth 2 (two) times daily.  Marland Kitchen CALCIUM PO Take 1-2 tablets by mouth 2 (two) times daily. Take 1 tablet every morning and take 2 tablets every evening.  . Cholecalciferol (VITAMIN D PO) Take 4 drops by mouth daily.  . Digestive Enzymes CAPS Take 1 capsule by mouth 3 (three) times daily with meals.  Marland Kitchen MAGNESIUM PO Take 1 tablet by mouth daily.  . Misc Natural Products (ADRENAL PO) Take 1 tablet by mouth  daily.  . Multiple Vitamin (MULTIVITAMIN WITH MINERALS) TABS Take 1 tablet by mouth daily.  Cecille Amsterdam D Arco 1000 MG CAPS Take by mouth daily.  . sodium chloride (OCEAN) 0.65 % nasal spray Place 1 spray into the nose daily as needed. For congestion.  Marland Kitchen Ubiquinol 100 MG CAPS Take by mouth.  Marland Kitchen UNABLE TO FIND Argentyn 23  . UNABLE TO [place] Salt  . UNABLE TO FIND Med Name: iodine 12.5 mg twice daily  . UNABLE TO FIND Med Name: Alphalipoic Acid 200mg  once daily.  Marland Kitchen UNABLE TO FIND Med Name: Glutathione, 500 mg once daily.  . Vaginal Lubricant (REPLENS) GEL Place vaginally Nightly.  . vitamin A 25000 UNIT capsule Take 25,000 Units by mouth 2 (two) times daily.  . Zinc 50 MG CAPS Take 50 mg by mouth daily.  Marland Kitchen zolpidem (AMBIEN) 5 MG tablet Take 1/2 tablet by mouth daily as needed   No facility-administered encounter medications on file as of 10/26/2019.    Allergies (verified) Antihistamines, diphenhydramine-type; Ofirmev [acetaminophen]; Phenergan [promethazine]; and Scopolamine   History: Past Medical History:  Diagnosis Date  . Abdominal distension   . Abdominal pain   . Arthritis    hands, feet  . Back pain with radiation   . Cancer ([place])    right breast  . Cough   . Dysrhythmia    rare palpitation  . GERD (gastroesophageal reflux disease)   . Hypothyroidism   . Low back pain with  sciatica   . Lymph nodes enlarged   . Nasal congestion   . Palpitations   . Seizures ([place])    phenergan caused it  . Sore throat    Past Surgical History:  Procedure Laterality Date  . APPENDECTOMY  10/03/11  . BACK SURGERY     lumbar fusion  . BREAST LUMPECTOMY WITH SENTINEL LYMPH NODE BIOPSY Right 04/24/2015   Procedure: RIGHT BREAST LUMPECTOMY WITH SENTINEL LYMPH NODE BIOPSY;  Surgeon: [name], MD;  Location: [place];  Service: General;  Laterality: Right;  . CESAREAN SECTION  09/09/81  . LAPAROSCOPIC APPENDECTOMY  10/03/2011   Procedure: APPENDECTOMY LAPAROSCOPIC;   Surgeon: Zenovia Jarred, MD;  Location: [place];  Service: General;  Laterality: N/A;  . [place] - approximate   Family History  Problem Relation Age of Onset  . Emphysema Father    Social History   Socioeconomic History  . Marital status: Married    Spouse name: Not on file  . Number of children: Not on file  . Years of education: Not on file  . Highest education level: Not on file  Occupational History  . Occupation: retired  Tobacco Use  . Smoking status: Never Smoker  . Smokeless tobacco: Never Used  Vaping Use  . Vaping Use: Never used  Substance and Sexual Activity  . Alcohol use: Not Currently  . Drug use: No  . Sexual activity: Not Currently  Other Topics Concern  . Not on file  Social History Narrative  . Not on file   Social Determinants of Health   Financial Resource Strain: Low Risk   . Difficulty of Paying Living Expenses: Not hard at all  Food Insecurity: No Food Insecurity  . Worried About Charity fundraiser in the Last Year: Never true  . Ran Out of Food in the Last Year: Never true  Transportation Needs: No Transportation Needs  . Lack of Transportation (Medical): No  . Lack of Transportation (Non-Medical): No  Physical Activity: Insufficiently Active  . Days of Exercise per Week: 2 days  . Minutes of Exercise per Session: 60 min  Stress: Stress Concern Present  . Feeling of Stress : Very much  Social Connections:   . Frequency of Communication with Friends and Family:   . Frequency of Social Gatherings with Friends and Family:   . Attends Religious Services:   . Active Member of Clubs or Organizations:   . Attends Archivist Meetings:   Marland Kitchen Marital Status:     Tobacco Counseling Counseling given: Not Answered   Clinical Intake:  Pre-visit preparation completed: Yes  Pain : No/denies pain     Nutritional Status: BMI of 19-24  Normal Nutritional Risks: None  How often do you need to have someone help  you when you read instructions, pamphlets, or other written materials from your doctor or pharmacy?: 1 - Never What is the last grade level you completed in school?: master's degree  Diabetic? no  Interpreter Needed?: No  Information entered by :: NAllen LPN   Activities of Daily Living In your present state of health, do you have any difficulty performing the following activities: 10/26/2019  Hearing? N  Vision? N  Difficulty concentrating or making decisions? Y  Comment when tired and stressed  Walking or climbing stairs? Y  Comment usually after 3-4  Dressing or bathing? N  Doing errands, shopping? N  Preparing Food and eating ? N  Using the Toilet?  N  In the past six months, have you accidently leaked urine? N  Do you have problems with loss of bowel control? N  Managing your Medications? N  Managing your Finances? N  Housekeeping or managing your Housekeeping? N  Some recent data might be hidden    Patient Care Team: Minette Brine, FNP as PCP - General (General Practice)  Indicate any recent Medical Services you may have received from other than [facility] providers in the past year (date may be approximate).     Assessment:   This is a routine wellness examination for Shania.  Hearing/Vision screen  Hearing Screening   125Hz  250Hz  500Hz  1000Hz  2000Hz  3000Hz  4000Hz  6000Hz  8000Hz   Right ear:           Left ear:           Vision Screening Comments: Regular eye exams, [facility]  Dietary issues and exercise activities discussed: Current Exercise Habits: Home exercise routine, Type of exercise: walking;yoga, Time (Minutes): 60, Frequency (Times/Week): 2, Weekly Exercise (Minutes/Week): 120  Goals    . Gain weight     10/20/2018, wants to get to 120-125 pounds    . Patient Stated     10/26/2019, wants to get consistent sleep Exercise 30 minutes daily, clear up digestive issues       Depression Screen PHQ 2/9 Scores 10/26/2019 10/20/2018 04/14/2018  05/22/2015  PHQ - 2 Score 0 0 0 0  PHQ- 9 Score - 3 - -    Fall Risk Fall Risk  10/26/2019 01/02/2019 10/20/2018 04/14/2018 02/10/2018  Falls in the past year? 0 0 1 0 0  Comment - - - - Emmi Telephone Survey: data to providers prior to load  Number falls in past yr: - - 1 - -  Comment - - tripped over wire, tripped over a bottle of water - -  Injury with Fall? - - 1 0 -  Comment - - injured instep and ankle - -  Risk for fall due to : Other (Comment) - History of fall(s);Impaired mobility;Medication side effect - -  Risk for fall due to: Comment leg goes numb - - - -  Follow up - - Falls evaluation completed;Education provided;Falls prevention discussed - -    Any stairs in or around the home? No  If so, are there any without handrails? n/a Home free of loose throw rugs in walkways, pet beds, electrical cords, etc? Yes  Adequate lighting in your home to reduce risk of falls? Yes   ASSISTIVE DEVICES UTILIZED TO PREVENT FALLS:  Life alert? No  Use of a cane, walker or w/c? Yes  Grab bars in the bathroom? No  Shower chair or bench in shower? No  Elevated toilet seat or a handicapped toilet? No   TIMED UP AND GO:  Was the test performed? No . .   Gait steady and fast with assistive device  Cognitive Function:     6CIT Screen 10/26/2019 10/20/2018  What Year? 0 points 0 points  What month? 0 points 0 points  What time? 0 points 0 points  Count back from 20 0 points 0 points  Months in reverse 0 points 0 points  Repeat phrase 0 points 0 points  Total Score 0 0    Immunizations  There is no immunization history on file for this patient.  TDAP status: Due, Education has been provided regarding the importance of this vaccine. Advised may receive this vaccine at local pharmacy or Health  Dept. Aware to provide a copy of the vaccination record if obtained from local pharmacy or Health Dept. Verbalized acceptance and understanding. Flu Vaccine status: Declined, Education has been  provided regarding the importance of this vaccine but patient still declined. Advised may receive this vaccine at local pharmacy or Health Dept. Aware to provide a copy of the vaccination record if obtained from local pharmacy or Health Dept. Verbalized acceptance and understanding. Pneumococcal vaccine status: Declined,  Education has been provided regarding the importance of this vaccine but patient still declined. Advised may receive this vaccine at local pharmacy or Health Dept. Aware to provide a copy of the vaccination record if obtained from local pharmacy or Health Dept. Verbalized acceptance and understanding.  Covid-19 vaccine status: Declined, Education has been provided regarding the importance of this vaccine but patient still declined. Advised may receive this vaccine at local pharmacy or Health Dept.or vaccine clinic. Aware to provide a copy of the vaccination record if obtained from local pharmacy or Health Dept. Verbalized acceptance and understanding.  Qualifies for Shingles Vaccine? Yes   Zostavax completed No   Shingrix Completed?: No.    Education has been provided regarding the importance of this vaccine. Patient has been advised to call insurance company to determine out of pocket expense if they have not yet received this vaccine. Advised may also receive vaccine at local pharmacy or Health Dept. Verbalized acceptance and understanding.  Screening Tests Health Maintenance  Topic Date Due  . Hepatitis C Screening  Never done  . COVID-19 Vaccine (1) 11/11/2019 (Originally 11/22/1962)  . INFLUENZA VACCINE  06/20/2020 (Originally 10/22/2019)  . MAMMOGRAM  10/25/2020 (Originally 03/28/2017)  . DEXA SCAN  10/25/2020 (Originally 11/21/2016)  . COLONOSCOPY  10/25/2020 (Originally 11/21/2001)  . TETANUS/TDAP  10/25/2020 (Originally 11/22/1970)  . PNA vac Low Risk Adult (1 of 2 - PCV13) 10/25/2020 (Originally 11/21/2016)    Health Maintenance  Health Maintenance Due  Topic Date Due  .  Hepatitis C Screening  Never done    Colorectal cancer screening: Declines Mammogram status: Declines Bone Density status: Declines  Lung Cancer Screening: (Low Dose CT Chest recommended if Age 80-80 years, 30 pack-year currently smoking OR have quit w/in 15years.) does not qualify.   Lung Cancer Screening Referral: no  Additional Screening:  Hepatitis C Screening: does qualify; Maybe  Vision Screening: Recommended annual ophthalmology exams for early detection of glaucoma and other disorders of the eye. Is the patient up to date with their annual eye exam?  Yes  Who is the provider or what is the name of the office in which the patient attends annual eye exams? [facility] If pt is not established with a provider, would they like to be referred to a provider to establish care? No .   Dental Screening: Recommended annual dental exams for proper oral hygiene  Community Resource Referral / Chronic Care Management: CRR required this visit?  No   CCM required this visit?  No      Plan:     I have personally reviewed and noted the following in the patient's chart:   . Medical and social history . Use of alcohol, tobacco or illicit drugs  . Current medications and supplements . Functional ability and status . Nutritional status . Physical activity . Advanced directives . List of other physicians . Hospitalizations, surgeries, and ER visits in previous 12 months . Vitals . Screenings to include cognitive, depression, and falls . Referrals and appointments  In addition, I have  reviewed and discussed with patient certain preventive protocols, quality metrics, and best practice recommendations. A written personalized care plan for preventive services as well as general preventive health recommendations were provided to patient.     Kellie Simmering, LPN   [id number]   Nurse Notes: Patient declines preventative procedures and vaccinations.

## 2019-10-26 NOTE — Patient Instructions (Signed)
COVID-19 Vaccine Information can be found at: [email or web address] For questions related to vaccine distribution or appointments, please email [email or web address] or call [phone]    

## 2019-10-26 NOTE — Progress Notes (Signed)
This visit occurred during the SARS-CoV-2 public health emergency.  Safety protocols were in place, including screening questions prior to the visit, additional usage of staff PPE, and extensive cleaning of exam room while observing appropriate contact time as indicated for disinfecting solutions.  Subjective:     Patient ID: Virginia [name] , female    DOB: July 03, 1950 , 69 y.o.   MRN: [id number]   Chief Complaint  Patient presents with  . Insomnia    HPI  She is still taking 1/2 tab of ambien nightly in addition to a tea. She has tried not taking the [place] but still has difficulty going to sleep.    Insomnia Primary symptoms: difficulty falling asleep (almost an hour).  The onset quality is sudden. The problem occurs nightly. Exacerbated by: post menopausal. How many beverages per day that contain caffeine: 0 - 1 (has decaf coffee or herbal tea.).  Types of beverages you drink: coffee and tea. Typical bedtime:  Other (1-130am due to her chronic fatigue. ).  PMH includes: no hypertension, no depression, chronic pain (sciatica pain, had back surgery in 2013 did not get rid of it - thinks is nerve damage. she is doing gentle yoga. ).     Past Medical History:  Diagnosis Date  . Abdominal distension   . Abdominal pain   . Arthritis    hands, feet  . Back pain with radiation   . Cancer ([place])    right breast  . Cough   . Dysrhythmia    rare palpitation  . GERD (gastroesophageal reflux disease)   . Hypothyroidism   . Low back pain with sciatica   . Lymph nodes enlarged   . Nasal congestion   . Palpitations   . Seizures ([place])    phenergan caused it  . Sore throat      Family History  Problem Relation Age of Onset  . Emphysema Father      Current Outpatient Medications:  .  Ascorbic Acid (VITAMIN C) POWD, Take 2.5 g by mouth 2 (two) times daily., Disp: , Rfl:  .  Cholecalciferol (VITAMIN D PO), Take 4 drops by mouth daily., Disp: , Rfl:  .  MAGNESIUM PO, Take 1 tablet  by mouth daily., Disp: , Rfl:  .  Misc Natural Products (ADRENAL PO), Take 1 tablet by mouth daily., Disp: , Rfl:  .  sodium chloride (OCEAN) 0.65 % nasal spray, Place 1 spray into the nose daily as needed. For congestion., Disp: , Rfl:  .  UNABLE TO FIND, Argentyn 23, Disp: , Rfl:  .  UNABLE TO FIND, Celtic Sea Salt, Disp: , Rfl:  .  UNABLE TO FIND, Med Name: iodine 12.5 mg twice daily, Disp: , Rfl:  .  Zinc 50 MG CAPS, Take 50 mg by mouth daily., Disp: , Rfl:  .  zolpidem (AMBIEN) 5 MG tablet, Take 1/2 tablet by mouth daily as needed, Disp: 15 tablet, Rfl: 5   Allergies  Allergen Reactions  . Antihistamines, Diphenhydramine-Type Other (See Comments)    Dehydration and fatigue   . Ofirmev [Acetaminophen] Other (See Comments)    Drop in BP  . Phenergan [Promethazine] Other (See Comments)    Thinks it was a seizure  . Scopolamine Nausea And Vomiting     Review of Systems  Constitutional: Negative.   Respiratory: Negative.  Negative for cough.   Cardiovascular: Negative.  Negative for chest pain, palpitations and leg swelling.  Musculoskeletal: Positive for arthralgias (bilateral hands).  Neurological: Negative  for dizziness and headaches.  Psychiatric/Behavioral: Negative for depression. The patient has insomnia.      Today's Vitals   10/26/19 1431  BP: 110/70  Pulse: 77  Temp: 98 F (36.7 C)  TempSrc: Oral  Weight: 109 lb 2 oz (49.5 kg)  Height: 5' 5.2" (1.656 m)   Body mass index is 18.05 kg/m.   Objective:  Physical Exam Constitutional:      General: She is not in acute distress.    Appearance: Normal appearance.  Cardiovascular:     Rate and Rhythm: Normal rate and regular rhythm.     Pulses: Normal pulses.     Heart sounds: Normal heart sounds. No murmur heard.   Pulmonary:     Effort: Pulmonary effort is normal. No respiratory distress.     Breath sounds: No wheezing.  Musculoskeletal:        General: Tenderness (bilateral hands ) present. No swelling.   Skin:    General: Skin is warm and dry.     Capillary Refill: Capillary refill takes less than 2 seconds.  Neurological:     General: No focal deficit present.     Mental Status: She is alert and oriented to person, place, and time.     Cranial Nerves: No cranial nerve deficit.  Psychiatric:        Mood and Affect: Mood normal.        Behavior: Behavior normal.        Thought Content: Thought content normal.        Judgment: Judgment normal.         Assessment And Plan:     1. Other insomnia  Continue with Lorrin Mais as needed  Encouraged to also take magnesium with evening meal - CMP14+EGFR - Hepatitis C antibody  2. Bilateral hand pain Comments: she will increase her tumeric intake and drink golden milk.   3. Encounter for hepatitis C screening test for low risk patient  Will check Hepatitis C screening due to recent recommendations to screen all adults 18 years and older - Hepatitis C antibody  4. COVID-19 virus vaccination declined  Declines covid 19 vaccine. Discussed risk of covid 15 and if she changes her mind about the vaccine to call the office.  Encouraged to take multivitamin, vitamin d, vitamin c and zinc to increase immune system. Aware can call office if would like to have vaccine here at office.     She is requesting a letter for jury summons due to her having the inability to be around smells, chemicals. Also unable to walk long distances.  She is also on disability since 2012.    Patient was given opportunity to ask questions. Patient verbalized understanding of the plan and was able to repeat key elements of the plan. All questions were answered to their satisfaction.  Minette Brine, FNP   I, Minette Brine, FNP, have reviewed all documentation for this visit. The documentation on 11/23/19 for the exam, diagnosis, procedures, and orders are all accurate and complete.   THE PATIENT IS ENCOURAGED TO PRACTICE SOCIAL DISTANCING DUE TO THE COVID-19 PANDEMIC.

## 2019-10-27 LAB — CMP14+EGFR
ALT: 56 IU/L — ABNORMAL HIGH (ref 0–32)
AST: 43 IU/L — ABNORMAL HIGH (ref 0–40)
Albumin/Globulin Ratio: 1.8 (ref 1.2–2.2)
Albumin: 4.5 g/dL (ref 3.8–4.8)
Alkaline Phosphatase: 133 IU/L — ABNORMAL HIGH (ref 48–121)
BUN/Creatinine Ratio: 6 — ABNORMAL LOW (ref 12–28)
BUN: 3 mg/dL — ABNORMAL LOW (ref 8–27)
Bilirubin Total: 0.3 mg/dL (ref 0.0–1.2)
CO2: 23 mmol/L (ref 20–29)
Calcium: 9.7 mg/dL (ref 8.7–10.3)
Chloride: 94 mmol/L — ABNORMAL LOW (ref 96–106)
Creatinine, Ser: 0.49 mg/dL — ABNORMAL LOW (ref 0.57–1.00)
GFR calc Af Amer: 117 mL/min/{1.73_m2} (ref >59)
GFR calc non Af Amer: 101 mL/min/{1.73_m2} (ref >59)
Globulin, Total: 2.5 g/dL (ref 1.5–4.5)
Glucose: 71 mg/dL (ref 65–99)
Potassium: 5.5 mmol/L — ABNORMAL HIGH (ref 3.5–5.2)
Sodium: 130 mmol/L — ABNORMAL LOW (ref 134–144)
Total Protein: 7 g/dL (ref 6.0–8.5)

## 2019-10-27 LAB — HEPATITIS C ANTIBODY: Hep C Virus Ab: <0.1 s/co ratio (ref 0.0–0.9)

## 2019-10-30 ENCOUNTER — Encounter: Payer: Self-pay | Admitting: Nurse Practitioner

## 2019-11-16 DIAGNOSIS — D3122 Benign neoplasm of left retina: Secondary | ICD-10-CM | POA: Diagnosis not present

## 2020-01-08 ENCOUNTER — Encounter: Payer: Self-pay | Admitting: Nurse Practitioner

## 2020-01-08 ENCOUNTER — Other Ambulatory Visit: Payer: Self-pay | Admitting: Nurse Practitioner

## 2020-01-08 DIAGNOSIS — G4709 Other insomnia: Secondary | ICD-10-CM

## 2020-01-08 MED ORDER — ZOLPIDEM TARTRATE 5 MG PO TABS: ORAL_TABLET | ORAL | 5 refills | Status: DC

## 2020-04-10 DIAGNOSIS — D3132 Benign neoplasm of left choroid: Secondary | ICD-10-CM | POA: Diagnosis not present

## 2020-04-10 DIAGNOSIS — Z83518 Family history of other specified eye disorder: Secondary | ICD-10-CM | POA: Diagnosis not present

## 2020-04-10 DIAGNOSIS — H2513 Age-related nuclear cataract, bilateral: Secondary | ICD-10-CM | POA: Diagnosis not present

## 2020-05-02 ENCOUNTER — Other Ambulatory Visit: Payer: Self-pay

## 2020-05-02 ENCOUNTER — Encounter: Payer: Self-pay | Admitting: Nurse Practitioner

## 2020-05-02 ENCOUNTER — Ambulatory Visit (INDEPENDENT_AMBULATORY_CARE_PROVIDER_SITE_OTHER): Payer: Medicare Other | Admitting: Nurse Practitioner

## 2020-05-02 VITALS — BP 112/70 | HR 74 | Temp 98.0°F | Ht 65.6 in | Wt 114.8 lb

## 2020-05-02 DIAGNOSIS — Z1211 Encounter for screening for malignant neoplasm of colon: Secondary | ICD-10-CM

## 2020-05-02 DIAGNOSIS — E2839 Other primary ovarian failure: Secondary | ICD-10-CM

## 2020-05-02 DIAGNOSIS — G4709 Other insomnia: Secondary | ICD-10-CM

## 2020-05-02 DIAGNOSIS — Z2821 Immunization not carried out because of patient refusal: Secondary | ICD-10-CM | POA: Diagnosis not present

## 2020-05-02 DIAGNOSIS — R945 Abnormal results of liver function studies: Secondary | ICD-10-CM

## 2020-05-02 DIAGNOSIS — Z5320 Procedure and treatment not carried out because of patient's decision for unspecified reasons: Secondary | ICD-10-CM

## 2020-05-02 MED ORDER — ZOLPIDEM TARTRATE 5 MG PO TABS: ORAL_TABLET | ORAL | 5 refills | Status: DC

## 2020-05-02 NOTE — Progress Notes (Incomplete)
I,Yamilka Roman [employer] as a Education administrator for [employer], FNP.,have documented all relevant documentation on the behalf of Minette Brine, FNP,as directed by  Minette Brine, FNP while in the presence of Minette Brine, [place]. This visit occurred during the SARS-CoV-2 public health emergency.  Safety protocols were in place, including screening questions prior to the visit, additional usage of staff PPE, and extensive cleaning of exam room while observing appropriate contact time as indicated for disinfecting solutions.  Subjective:     Patient ID: Virginia [name] , female    DOB: 1950/12/19 , 70 y.o.   MRN: [id number]   Chief Complaint  Patient presents with  . Insomnia    HPI  Patient presents today for a 6 month f/u on her insomnia.  She has not tried to wean off of the [place].  She is afraid she will not be able to sleep.   Insomnia Primary symptoms: difficulty falling asleep (almost an hour).  The onset quality is sudden. The problem occurs nightly. Exacerbated by: post menopausal. How many beverages per day that contain caffeine: 0 - 1 (has decaf coffee or herbal tea. - maybe 2 times a week she will drink caffeine).  Types of beverages you drink: coffee and tea. Past treatments include medication. The treatment provided moderate relief. Typical bedtime:  Other (1-130am due to her chronic fatigue. ).  PMH includes: no hypertension, no depression, chronic pain (sciatica pain, had back surgery in 2013 did not get rid of it - thinks is nerve damage. she is doing gentle yoga. ).     Past Medical History:  Diagnosis Date  . Abdominal distension   . Abdominal pain   . Arthritis    hands, feet  . Back pain with radiation   . Cancer ([place])    right breast  . Cough   . Dysrhythmia    rare palpitation  . GERD (gastroesophageal reflux disease)   . Hypothyroidism   . Low back pain with sciatica   . Lymph nodes enlarged   . Nasal congestion   . Palpitations   . Seizures ([place])    phenergan  caused it  . Sore throat      Family History  Problem Relation Age of Onset  . Emphysema Father      Current Outpatient Medications:  .  Ascorbic Acid (VITAMIN C) POWD, Take 2.5 g by mouth 2 (two) times daily., Disp: , Rfl:  .  Cholecalciferol (VITAMIN D PO), Take 4 drops by mouth daily., Disp: , Rfl:  .  MAGNESIUM PO, Take 1 tablet by mouth daily., Disp: , Rfl:  .  sodium chloride (OCEAN) 0.65 % nasal spray, Place 1 spray into the nose daily as needed. For congestion., Disp: , Rfl:  .  UNABLE TO FIND, Argentyn 23, Disp: , Rfl:  .  UNABLE TO FIND, Med Name: iodine 12.5 mg twice daily, Disp: , Rfl:  .  Zinc 50 MG CAPS, Take 50 mg by mouth daily., Disp: , Rfl:  .  zolpidem (AMBIEN) 5 MG tablet, Take 1/2 tablet by mouth daily as needed, Disp: 15 tablet, Rfl: 5   Allergies  Allergen Reactions  . Antihistamines, Diphenhydramine-Type Other (See Comments)    Dehydration and fatigue   . Ofirmev [Acetaminophen] Other (See Comments)    Drop in BP  . Phenergan [Promethazine] Other (See Comments)    Thinks it was a seizure  . Scopolamine Nausea And Vomiting     Review of Systems  Constitutional: Negative.  HENT: Negative.   Eyes: Negative.   Respiratory: Negative.   Cardiovascular: Negative.   Gastrointestinal: Negative.   Endocrine: Negative.   Genitourinary: Negative.   Musculoskeletal: Negative.        Chronic hip and leg pain - will do yoga to help   Skin: Negative.   Hematological: Negative.   Psychiatric/Behavioral: Negative for depression. The patient has insomnia.      Today's Vitals   05/02/20 1433  BP: 112/70  Pulse: 74  Temp: 98 F (36.7 C)  TempSrc: Oral  Weight: 114 lb 12.8 oz (52.1 kg)  Height: 5' 5.6" (1.666 m)  PainSc: 0-No pain   Body mass index is 18.76 kg/m.   Objective:  Physical Exam Constitutional:      Appearance: Normal appearance. She is normal weight.  Cardiovascular:     Rate and Rhythm: Normal rate and regular rhythm.     Pulses:  Normal pulses.     Heart sounds: Normal heart sounds.  Pulmonary:     Effort: Pulmonary effort is normal.     Breath sounds: Normal breath sounds.  Abdominal:     General: Abdomen is flat. Bowel sounds are normal.     Palpations: Abdomen is soft.  Musculoskeletal:        General: Normal range of motion.     Cervical back: Normal range of motion and neck supple.  Skin:    General: Skin is warm and dry.     Capillary Refill: Capillary refill takes less than 2 seconds.  Neurological:     General: No focal deficit present.     Mental Status: She is alert and oriented to person, place, and time.  Psychiatric:        Mood and Affect: Mood normal.        Behavior: Behavior normal.        Thought Content: Thought content normal.        Judgment: Judgment normal.         Assessment And Plan:     1. Other insomnia Chronic, she continues with ambien 2.5 mg as needed at bedtime  2. COVID-19 vaccination declined  3. Abnormal liver function - CMP14+EGFR  4. Decreased estrogen level - DG Bone Density; Future  5. Encounter for screening colonoscopy - Cologuard  6. Screening mammography declined     Patient was given opportunity to ask questions. Patient verbalized understanding of the plan and was able to repeat key elements of the plan. All questions were answered to their satisfaction.  Minette Brine, FNP   I, Minette Brine, FNP, have reviewed all documentation for this visit. The documentation on 05/02/20 for the exam, diagnosis, procedures, and orders are all accurate and complete.   THE PATIENT IS ENCOURAGED TO PRACTICE SOCIAL DISTANCING DUE TO THE COVID-19 PANDEMIC.

## 2020-05-02 NOTE — Patient Instructions (Addendum)

## 2020-05-02 NOTE — Progress Notes (Signed)
I,Yamilka Roman [employer] as a Education administrator for [employer], FNP.,have documented all relevant documentation on the behalf of Minette Brine, FNP,as directed by  Minette Brine, FNP while in the presence of Minette Brine, [place]. This visit occurred during the SARS-CoV-2 public health emergency.  Safety protocols were in place, including screening questions prior to the visit, additional usage of staff PPE, and extensive cleaning of exam room while observing appropriate contact time as indicated for disinfecting solutions.  Subjective:     Patient ID: Virginia [name] , female    DOB: [birth year] , 70 y.o.   MRN: [id number]   Chief Complaint  Patient presents with  . Insomnia    HPI    Patient presents today for a 6 month f/u on her insomnia.  She has not tried to wean off of the [place].  She is afraid she will not be able to sleep.   Insomnia Primary symptoms: difficulty falling asleep (almost an hour).  The onset quality is sudden. The problem occurs nightly. Exacerbated by: post menopausal. How many beverages per day that contain caffeine: 0 - 1 (has decaf coffee or herbal tea. - maybe 2 times a week she will drink caffeine).  Types of beverages you drink: coffee and tea. Past treatments include medication. The treatment provided moderate relief. Typical bedtime:  Other (1-130am due to her chronic fatigue. ).  PMH includes: no hypertension, no depression, chronic pain (sciatica pain, had back surgery in 2013 did not get rid of it - thinks is nerve damage. she is doing gentle yoga. ).   Insomnia Primary symptoms: difficulty falling asleep (almost an hour).  The onset quality is gradual. The problem occurs nightly. The problem is unchanged. How many beverages per day that contain caffeine: 0 - 1 (has decaf coffee or heral tea - maybe 2 times a week she will drink caffeine).  Typical bedtime:  Other (1-130 due to  her chronic fatige).  PMH includes: no depression.     Past Medical History:   Diagnosis Date  . Abdominal distension   . Abdominal pain   . Arthritis    hands, feet  . Back pain with radiation   . Cancer ([place])    right breast  . Cough   . Dysrhythmia    rare palpitation  . GERD (gastroesophageal reflux disease)   . Hypothyroidism   . Low back pain with sciatica   . Lymph nodes enlarged   . Nasal congestion   . Palpitations   . Seizures ([place])    phenergan caused it  . Sore throat      Family History  Problem Relation Age of Onset  . Emphysema Father      Current Outpatient Medications:  .  Ascorbic Acid (VITAMIN C) POWD, Take 2.5 g by mouth 2 (two) times daily., Disp: , Rfl:  .  Cholecalciferol (VITAMIN D PO), Take 4 drops by mouth daily., Disp: , Rfl:  .  MAGNESIUM PO, Take 1 tablet by mouth daily., Disp: , Rfl:  .  sodium chloride (OCEAN) 0.65 % nasal spray, Place 1 spray into the nose daily as needed. For congestion., Disp: , Rfl:  .  UNABLE TO FIND, Argentyn 23, Disp: , Rfl:  .  UNABLE TO FIND, Med Name: iodine 12.5 mg twice daily, Disp: , Rfl:  .  Zinc 50 MG CAPS, Take 50 mg by mouth daily., Disp: , Rfl:  .  zolpidem (AMBIEN) 5 MG tablet, Take 1/2 tablet by mouth daily as needed,  Disp: 15 tablet, Rfl: 5   Allergies  Allergen Reactions  . Antihistamines, Diphenhydramine-Type Other (See Comments)    Dehydration and fatigue   . Ofirmev [Acetaminophen] Other (See Comments)    Drop in BP  . Phenergan [Promethazine] Other (See Comments)    Thinks it was a seizure  . Scopolamine Nausea And Vomiting     Review of Systems  Constitutional: Negative.   HENT: Negative.   Eyes: Negative.   Respiratory: Negative.   Cardiovascular: Negative.   Gastrointestinal: Negative.   Endocrine: Negative.   Genitourinary: Negative.   Musculoskeletal: Negative.   Skin: Negative.   Hematological: Negative.   Psychiatric/Behavioral: Negative for depression. The patient has insomnia.      Today's Vitals   05/02/20 1433  BP: 112/70  Pulse: 74  Temp:  98 F (36.7 C)  TempSrc: Oral  Weight: 114 lb 12.8 oz (52.1 kg)  Height: 5' 5.6" (1.666 m)  PainSc: 0-No pain   Body mass index is 18.76 kg/m.   Objective:  Physical Exam Constitutional:      Appearance: Normal appearance. She is normal weight.  Cardiovascular:     Rate and Rhythm: Normal rate and regular rhythm.     Pulses: Normal pulses.     Heart sounds: Normal heart sounds.  Pulmonary:     Effort: Pulmonary effort is normal.     Breath sounds: Normal breath sounds.  Abdominal:     General: Abdomen is flat. Bowel sounds are normal.     Palpations: Abdomen is soft.  Musculoskeletal:        General: Normal range of motion.     Cervical back: Normal range of motion and neck supple.  Skin:    General: Skin is warm and dry.     Capillary Refill: Capillary refill takes less than 2 seconds.  Neurological:     General: No focal deficit present.     Mental Status: She is alert and oriented to person, place, and time.  Psychiatric:        Mood and Affect: Mood normal.        Behavior: Behavior normal.        Thought Content: Thought content normal.        Judgment: Judgment normal.         Assessment And Plan:     1. Other insomnia  She has not tried to wean off her Lorrin Mais she is on a low dose  I have also encouraged natural sleep aids and good sleep hygeine. - zolpidem (AMBIEN) 5 MG tablet; Take 1/2 tablet by mouth daily as needed  Dispense: 15 tablet; Refill: 5  2. COVID-19 vaccination declined Declines covid 19 vaccine. Discussed risk of covid 35 and if she changes her mind about the vaccine to call the office.  Encouraged to take multivitamin, vitamin d, vitamin c and zinc to increase immune system. Aware can call office if would like to have vaccine here at office.   3. Abnormal liver function  Will recheck levels were slightly elevated at last visit  Encouraged to avoid processed foods. - CMP14+EGFR  4. Decreased estrogen level - DG Bone Density;  Future  5. Encounter for screening colonoscopy  She is willing to a cologuard order placed. - Cologuard  6. Screening mammography declined  She is aware of the risk of breast cancer and regular routine mammogram for early detection but refuses to have done. She does have a previous history of breast Cancer  Patient was given opportunity to ask questions. Patient verbalized understanding of the plan and was able to repeat key elements of the plan. All questions were answered to their satisfaction.  Minette Brine, FNP   I, Minette Brine, FNP, have reviewed all documentation for this visit. The documentation on 05/26/20 for the exam, diagnosis, procedures, and orders are all accurate and complete.  THE PATIENT IS ENCOURAGED TO PRACTICE SOCIAL DISTANCING DUE TO THE COVID-19 PANDEMIC.

## 2020-05-03 LAB — CMP14+EGFR
ALT: 30 IU/L (ref 0–32)
AST: 30 IU/L (ref 0–40)
Albumin/Globulin Ratio: 1.9 (ref 1.2–2.2)
Albumin: 4.4 g/dL (ref 3.8–4.8)
Alkaline Phosphatase: 136 IU/L — ABNORMAL HIGH (ref 44–121)
BUN/Creatinine Ratio: 8 — ABNORMAL LOW (ref 12–28)
BUN: 4 mg/dL — ABNORMAL LOW (ref 8–27)
Bilirubin Total: 0.3 mg/dL (ref 0.0–1.2)
CO2: 20 mmol/L (ref 20–29)
Calcium: 9.3 mg/dL (ref 8.7–10.3)
Chloride: 97 mmol/L (ref 96–106)
Creatinine, Ser: 0.5 mg/dL — ABNORMAL LOW (ref 0.57–1.00)
GFR calc Af Amer: 115 mL/min/{1.73_m2} (ref >59)
GFR calc non Af Amer: 100 mL/min/{1.73_m2} (ref >59)
Globulin, Total: 2.3 g/dL (ref 1.5–4.5)
Glucose: 93 mg/dL (ref 65–99)
Potassium: 5.2 mmol/L (ref 3.5–5.2)
Sodium: 132 mmol/L — ABNORMAL LOW (ref 134–144)
Total Protein: 6.7 g/dL (ref 6.0–8.5)

## 2020-05-22 DIAGNOSIS — Z1211 Encounter for screening for malignant neoplasm of colon: Secondary | ICD-10-CM | POA: Diagnosis not present

## 2020-05-22 LAB — COLOGUARD: Cologuard: POSITIVE — AB

## 2020-06-03 ENCOUNTER — Other Ambulatory Visit: Payer: Self-pay

## 2020-06-03 ENCOUNTER — Encounter: Payer: Self-pay | Admitting: Nurse Practitioner

## 2020-06-03 ENCOUNTER — Telehealth: Payer: Self-pay

## 2020-06-03 DIAGNOSIS — R195 Other fecal abnormalities: Secondary | ICD-10-CM

## 2020-06-03 NOTE — Telephone Encounter (Signed)
I called patient to advise her that her cologuard came back positive and Ms.Laurance Flatten is going to place a referral to GI so patient can be evaluated further. Patient was hesitant on the referral she stated she had a colonoscopy previously done and it didn't go well for her. I advised pt we will still place the referral and she can make the decision when they call her. YL,RMA

## 2020-08-22 ENCOUNTER — Ambulatory Visit
Admission: RE | Admit: 2020-08-22 | Discharge: 2020-08-22 | Disposition: A | Discharge status: Home / Self Care | Payer: Medicare Other | Source: Ambulatory Visit | Attending: Nurse Practitioner | Admitting: Nurse Practitioner

## 2020-08-22 ENCOUNTER — Other Ambulatory Visit: Payer: Self-pay

## 2020-08-22 DIAGNOSIS — Z780 Asymptomatic menopausal state: Secondary | ICD-10-CM | POA: Diagnosis not present

## 2020-08-22 DIAGNOSIS — M810 Age-related osteoporosis without current pathological fracture: Secondary | ICD-10-CM | POA: Diagnosis not present

## 2020-08-22 DIAGNOSIS — E2839 Other primary ovarian failure: Secondary | ICD-10-CM

## 2020-10-30 ENCOUNTER — Ambulatory Visit (INDEPENDENT_AMBULATORY_CARE_PROVIDER_SITE_OTHER): Payer: Medicare Other | Admitting: Nurse Practitioner

## 2020-10-30 ENCOUNTER — Other Ambulatory Visit: Payer: Self-pay

## 2020-10-30 ENCOUNTER — Encounter: Payer: Self-pay | Admitting: Nurse Practitioner

## 2020-10-30 VITALS — BP 112/78 | HR 80 | Temp 98.3°F | Ht 65.6 in | Wt 114.4 lb

## 2020-10-30 DIAGNOSIS — Z853 Personal history of malignant neoplasm of breast: Secondary | ICD-10-CM | POA: Diagnosis not present

## 2020-10-30 DIAGNOSIS — R195 Other fecal abnormalities: Secondary | ICD-10-CM | POA: Diagnosis not present

## 2020-10-30 DIAGNOSIS — Z2821 Immunization not carried out because of patient refusal: Secondary | ICD-10-CM | POA: Diagnosis not present

## 2020-10-30 DIAGNOSIS — M810 Age-related osteoporosis without current pathological fracture: Secondary | ICD-10-CM | POA: Diagnosis not present

## 2020-10-30 DIAGNOSIS — Z5320 Procedure and treatment not carried out because of patient's decision for unspecified reasons: Secondary | ICD-10-CM | POA: Diagnosis not present

## 2020-10-30 DIAGNOSIS — G4709 Other insomnia: Secondary | ICD-10-CM

## 2020-10-30 MED ORDER — ZOLPIDEM TARTRATE 5 MG PO TABS: ORAL_TABLET | ORAL | 5 refills | Status: DC

## 2020-10-30 MED ORDER — MAGNESIUM GLYCINATE 665 MG PO CAPS: 1.0000 | ORAL_CAPSULE | Freq: Every evening | ORAL | 1 refills | Status: AC | PRN

## 2020-10-30 NOTE — Patient Instructions (Signed)
Insomnia Insomnia is a sleep disorder that makes it difficult to fall asleep or stay asleep. Insomnia can cause fatigue, low energy, difficulty concentrating, moodswings, and poor performance at work or school. There are three different ways to classify insomnia: Difficulty falling asleep. Difficulty staying asleep. Waking up too early in the morning. Any type of insomnia can be long-term (chronic) or short-term (acute). Both are common. Short-term insomnia usually lasts for three months or less. Chronic insomnia occurs at least three times a week for longer than threemonths. What are the causes? Insomnia may be caused by another condition, situation, or substance, such as: Anxiety. Certain medicines. Gastroesophageal reflux disease (GERD) or other gastrointestinal conditions. Asthma or other breathing conditions. Restless legs syndrome, sleep apnea, or other sleep disorders. Chronic pain. Menopause. Stroke. Abuse of alcohol, tobacco, or illegal drugs. Mental health conditions, such as depression. Caffeine. Neurological disorders, such as Alzheimer's disease. An overactive thyroid (hyperthyroidism). Sometimes, the cause of insomnia may not be known. What increases the risk? Risk factors for insomnia include: Gender. Women are affected more often than men. Age. Insomnia is more common as you get older. Stress. Lack of exercise. Irregular work schedule or working night shifts. Traveling between different time zones. Certain medical and mental health conditions. What are the signs or symptoms? If you have insomnia, the main symptom is having trouble falling asleep or having trouble staying asleep. This may lead to other symptoms, such as: Feeling fatigued or having low energy. Feeling nervous about going to sleep. Not feeling rested in the morning. Having trouble concentrating. Feeling irritable, anxious, or depressed. How is this diagnosed? This condition may be diagnosed based  on: Your symptoms and medical history. Your health care provider may ask about: Your sleep habits. Any medical conditions you have. Your mental health. A physical exam. How is this treated? Treatment for insomnia depends on the cause. Treatment may focus on treating an underlying condition that is causing insomnia. Treatment may also include: Medicines to help you sleep. Counseling or therapy. Lifestyle adjustments to help you sleep better. Follow these instructions at home: Eating and drinking  Limit or avoid alcohol, caffeinated beverages, and cigarettes, especially close to bedtime. These can disrupt your sleep. Do not eat a large meal or eat spicy foods right before bedtime. This can lead to digestive discomfort that can make it hard for you to sleep.  Sleep habits  Keep a sleep diary to help you and your health care provider figure out what could be causing your insomnia. Write down: When you sleep. When you wake up during the night. How well you sleep. How rested you feel the next day. Any side effects of medicines you are taking. What you eat and drink. Make your bedroom a dark, comfortable place where it is easy to fall asleep. Put up shades or blackout curtains to block light from outside. Use a white noise machine to block noise. Keep the temperature cool. Limit screen use before bedtime. This includes: Watching TV. Using your smartphone, tablet, or computer. Stick to a routine that includes going to bed and waking up at the same times every day and night. This can help you fall asleep faster. Consider making a quiet activity, such as reading, part of your nighttime routine. Try to avoid taking naps during the day so that you sleep better at night. Get out of bed if you are still awake after 15 minutes of trying to sleep. Keep the lights down, but try reading or doing a quiet   activity. When you feel sleepy, go back to bed.  General instructions Take over-the-counter  and prescription medicines only as told by your health care provider. Exercise regularly, as told by your health care provider. Avoid exercise starting several hours before bedtime. Use relaxation techniques to manage stress. Ask your health care provider to suggest some techniques that may work well for you. These may include: Breathing exercises. Routines to release muscle tension. Visualizing peaceful scenes. Make sure that you drive carefully. Avoid driving if you feel very sleepy. Keep all follow-up visits as told by your health care provider. This is important. Contact a health care provider if: You are tired throughout the day. You have trouble in your daily routine due to sleepiness. You continue to have sleep problems, or your sleep problems get worse. Get help right away if: You have serious thoughts about hurting yourself or someone else. If you ever feel like you may hurt yourself or others, or have thoughts about taking your own life, get help right away. You can go to your nearest emergency department or call: Your local emergency services (911 in the [place] A suicide crisis helpline, such as the National Suicide Prevention Lifeline at 1-800-273-8255. This is open 24 hours a day. Summary Insomnia is a sleep disorder that makes it difficult to fall asleep or stay asleep. Insomnia can be long-term (chronic) or short-term (acute). Treatment for insomnia depends on the cause. Treatment may focus on treating an underlying condition that is causing insomnia. Keep a sleep diary to help you and your health care provider figure out what could be causing your insomnia. This information is not intended to replace advice given to you by your health care provider. Make sure you discuss any questions you have with your healthcare provider. Document Revised: 01/18/2020 Document Reviewed: 01/18/2020 Elsevier Patient Education  2022 Elsevier Inc.  

## 2020-10-30 NOTE — Progress Notes (Signed)
I,Yamilka Roman [employer] as a Education administrator for [employer], FNP.,have documented all relevant documentation on the behalf of Minette Brine, FNP,as directed by  Minette Brine, FNP while in the presence of Minette Brine, [place].  This visit occurred during the SARS-CoV-2 public health emergency.  Safety protocols were in place, including screening questions prior to the visit, additional usage of staff PPE, and extensive cleaning of exam room while observing appropriate contact time as indicated for disinfecting solutions.  Subjective:     Patient ID: Virginia [name] , female    DOB: [birth year] , 70 y.o.   MRN: [id number]   Chief Complaint  Patient presents with   Insomnia    HPI  Patient presents today for a 6 month f/u on her insomnia. She has noticed when she takes 1/2 tab sometimes she needs more but does not take the [place] will rely on the herbal supplements  Patient refuses to go to GI provider for positive cologuard.  She feels like her digestion is improving. She thought the blood [name] have been coming from irritation to her rectal area at the time of the cologuard. Occasionally will have stomach pain. When she eats this improves. She had breast cancer in the past with lumpectomy and natural therapy treatment.  She does not have any surveillance of her breast cancer.   Wt Readings from Last 3 Encounters: 10/30/20 : 114 lb 6.4 oz (51.9 kg) 05/02/20 : 114 lb 12.8 oz (52.1 kg) 10/26/19 : 109 lb 2 oz (49.5 kg)    Insomnia Primary symptoms: sleep disturbance, difficulty falling asleep (almost an hour).   The onset quality is gradual. The problem occurs nightly. The problem is unchanged. How many beverages per day that contain caffeine: 0 - 1 (has decaf coffee or heral tea - maybe 2 times a week she will drink caffeine).  Typical bedtime:  Other (1-130 due to  her chronic fatige).  PMH includes: no depression.     Past Medical History:  Diagnosis Date   Abdominal distension    Abdominal  pain    Arthritis    hands, feet   Back pain with radiation    Cancer (HCC)    right breast   Cough    Dysrhythmia    rare palpitation   GERD (gastroesophageal reflux disease)    Hypothyroidism    Low back pain with sciatica    Lymph nodes enlarged    Nasal congestion    Palpitations    Seizures (HCC)    phenergan caused it   Sore throat      Family History  Problem Relation Age of Onset   Emphysema Father      Current Outpatient Medications:    Ascorbic Acid (VITAMIN C) POWD, Take 2.5 g by mouth 2 (two) times daily., Disp: , Rfl:    B COMPLEX VITAMINS PO, Take 1 tablet by mouth daily at 2 am., Disp: , Rfl:    Cholecalciferol (VITAMIN D PO), Take 4 drops by mouth daily., Disp: , Rfl:    Magnesium Glycinate 665 MG CAPS, Take 1 capsule by mouth at bedtime as needed., Disp: 30 capsule, Rfl: 1   MAGNESIUM PO, Take 1 tablet by mouth daily., Disp: , Rfl:    Multiple Vitamins-Minerals (ZINC PO), Take by mouth. daily, Disp: , Rfl:    sodium chloride (OCEAN) 0.65 % nasal spray, Place 1 spray into the nose daily as needed. For congestion., Disp: , Rfl:    UNABLE TO FIND, Argentyn 23, Disp: ,  Rfl:    UNABLE TO FIND, Med Name: iodine 4 drops daily, Disp: , Rfl:    zolpidem (AMBIEN) 5 MG tablet, Take 1/2 tablet by mouth daily as needed, Disp: 15 tablet, Rfl: 5   Allergies  Allergen Reactions   Antihistamines, Diphenhydramine-Type Other (See Comments)    Dehydration and fatigue    Ofirmev [Acetaminophen] Other (See Comments)    Drop in BP   Phenergan [Promethazine] Other (See Comments)    Thinks it was a seizure   Scopolamine Nausea And Vomiting     Review of Systems  Constitutional: Negative.   HENT: Negative.    Eyes: Negative.   Respiratory: Negative.    Cardiovascular: Negative.  Negative for chest pain, palpitations and leg swelling.  Gastrointestinal: Negative.   Endocrine: Negative.   Genitourinary: Negative.   Musculoskeletal: Negative.   Skin: Negative.    Hematological: Negative.   Psychiatric/Behavioral:  Positive for sleep disturbance. Negative for depression. The patient has insomnia.     Today's Vitals   10/30/20 1421  BP: 112/78  Pulse: 80  Temp: 98.3 F (36.8 C)  Weight: 114 lb 6.4 oz (51.9 kg)  Height: 5' 5.6" (1.666 m)  PainSc: 0-No pain   Body mass index is 18.69 kg/m.   Objective:  Physical Exam Constitutional:      General: She is not in acute distress.    Appearance: Normal appearance. She is normal weight.  Cardiovascular:     Rate and Rhythm: Normal rate and regular rhythm.     Pulses: Normal pulses.     Heart sounds: Normal heart sounds. No murmur heard. Pulmonary:     Effort: Pulmonary effort is normal. No respiratory distress.     Breath sounds: Normal breath sounds. No wheezing.  Abdominal:     General: Abdomen is flat. Bowel sounds are normal. There is no distension.     Palpations: Abdomen is soft.     Tenderness: There is no abdominal tenderness.  Musculoskeletal:        General: Normal range of motion.     Cervical back: Normal range of motion and neck supple.  Skin:    General: Skin is warm and dry.     Capillary Refill: Capillary refill takes less than 2 seconds.  Neurological:     General: No focal deficit present.     Mental Status: She is alert and oriented to person, place, and time.     Cranial Nerves: No cranial nerve deficit.     Motor: No weakness.  Psychiatric:        Mood and Affect: Mood normal.        Behavior: Behavior normal.        Thought Content: Thought content normal.        Judgment: Judgment normal.        Assessment And Plan:     1. Other insomnia Comments: Stable, continue current medications - CMP14+EGFR - zolpidem (AMBIEN) 5 MG tablet; Take 1/2 tablet by mouth daily as needed  Dispense: 15 tablet; Refill: 5  2. COVID-19 vaccination declined Declines covid 19 vaccine. Discussed risk of covid 68 and if she changes her mind about the vaccine to call the office.   Encouraged to take multivitamin, vitamin d, vitamin c and zinc to increase immune system. Aware can call office if would like to have vaccine here at office.   3. Mammogram declined Comments: Discussed importance of routine screenings  4. Positive colorectal cancer screening using Cologuard test Comments: Long  conversation about positive cologuard, did not go to the referral to GI, due to her history of breast cancer I strongly recommend she contact GI for appt - CBC  5. History of right breast cancer - CBC    Patient was given opportunity to ask questions. Patient verbalized understanding of the plan and was able to repeat key elements of the plan. All questions were answered to their satisfaction.  Minette Brine, FNP   I, Minette Brine, FNP, have reviewed all documentation for this visit. The documentation on 11/17/20 for the exam, diagnosis, procedures, and orders are all accurate and complete.   IF YOU HAVE BEEN REFERRED TO A SPECIALIST, IT [name] TAKE 1-2 WEEKS TO SCHEDULE/PROCESS THE REFERRAL. IF YOU HAVE NOT HEARD FROM US/SPECIALIST IN TWO WEEKS, PLEASE GIVE [place] A CALL AT [phone] X [phone].   THE PATIENT IS ENCOURAGED TO PRACTICE SOCIAL DISTANCING DUE TO THE COVID-19 PANDEMIC.

## 2020-10-31 LAB — CBC
Hematocrit: 41.7 % (ref 34.0–46.6)
Hemoglobin: 14 g/dL (ref 11.1–15.9)
MCH: 30.9 pg (ref 26.6–33.0)
MCHC: 33.6 g/dL (ref 31.5–35.7)
MCV: 92 fL (ref 79–97)
Platelets: 248 10*3/uL (ref 150–450)
RBC: 4.53 x10E6/uL (ref 3.77–5.28)
RDW: 12.4 % (ref 11.7–15.4)
WBC: 4.5 10*3/uL (ref 3.4–10.8)

## 2020-10-31 LAB — CMP14+EGFR
ALT: 29 IU/L (ref 0–32)
AST: 32 IU/L (ref 0–40)
Albumin/Globulin Ratio: 1.9 (ref 1.2–2.2)
Albumin: 4.6 g/dL (ref 3.8–4.8)
Alkaline Phosphatase: 162 IU/L — ABNORMAL HIGH (ref 44–121)
BUN/Creatinine Ratio: 5 — ABNORMAL LOW (ref 12–28)
BUN: 3 mg/dL — ABNORMAL LOW (ref 8–27)
Bilirubin Total: 0.4 mg/dL (ref 0.0–1.2)
CO2: 22 mmol/L (ref 20–29)
Calcium: 9.5 mg/dL (ref 8.7–10.3)
Chloride: 95 mmol/L — ABNORMAL LOW (ref 96–106)
Creatinine, Ser: 0.59 mg/dL (ref 0.57–1.00)
Globulin, Total: 2.4 g/dL (ref 1.5–4.5)
Glucose: 69 mg/dL (ref 65–99)
Potassium: 4.9 mmol/L (ref 3.5–5.2)
Sodium: 129 mmol/L — ABNORMAL LOW (ref 134–144)
Total Protein: 7 g/dL (ref 6.0–8.5)
eGFR: 98 mL/min/{1.73_m2} (ref >59)

## 2020-11-06 ENCOUNTER — Telehealth: Payer: Self-pay | Admitting: Nurse Practitioner

## 2020-11-06 NOTE — Telephone Encounter (Signed)
Left message for patient to call back and schedule Medicare Annual Wellness Visit (AWV) either virtually or in office.   Left both my jabber number [phone] and office number    Last AWV ;10/26/19 please schedule at anytime with [facility]    This should be a 45 minute visit.

## 2020-11-17 MED ORDER — ALENDRONATE SODIUM 70 MG PO TABS
70.0000 mg | ORAL_TABLET | ORAL | 5 refills | Status: DC
Start: 2020-11-17 — End: 2021-05-08

## 2020-12-04 ENCOUNTER — Ambulatory Visit (INDEPENDENT_AMBULATORY_CARE_PROVIDER_SITE_OTHER): Payer: Medicare Other

## 2020-12-04 VITALS — Ht 60.5 in | Wt 115.0 lb

## 2020-12-04 DIAGNOSIS — Z0000 Encounter for general adult medical examination without abnormal findings: Secondary | ICD-10-CM

## 2020-12-04 NOTE — Patient Instructions (Signed)
Virginia [name] , Thank you for taking time to come for your Medicare Wellness Visit. I appreciate your ongoing commitment to your health goals. Please review the following plan we discussed and let me know if I can assist you in the future.   Screening recommendations/referrals: Colonoscopy: decline Mammogram: decline Bone Density: completed 08/22/2020 Recommended yearly ophthalmology/optometry visit for glaucoma screening and checkup Recommended yearly dental visit for hygiene and checkup  Vaccinations: Influenza vaccine: decline Pneumococcal vaccine: decline Tdap vaccine: decline Shingles vaccine: decline   Covid-19:decline  Advanced directives: Advance directive discussed with you today.   Conditions/risks identified: none  Next appointment: Follow up in one year for your annual wellness visit    Preventive Care 65 Years and Older, Female Preventive care refers to lifestyle choices and visits with your health care provider that can promote health and wellness. What does preventive care include? A yearly physical exam. This is also called an annual well check. Dental exams once or twice a year. Routine eye exams. Ask your health care provider how often you should have your eyes checked. Personal lifestyle choices, including: Daily care of your teeth and gums. Regular physical activity. Eating a healthy diet. Avoiding tobacco and drug use. Limiting alcohol use. Practicing safe sex. Taking low-dose aspirin every day. Taking vitamin and mineral supplements as recommended by your health care provider. What happens during an annual well check? The services and screenings done by your health care provider during your annual well check will depend on your age, overall health, lifestyle risk factors, and family history of disease. Counseling  Your health care provider may ask you questions about your: Alcohol use. Tobacco use. Drug use. Emotional well-being. Home and relationship  well-being. Sexual activity. Eating habits. History of falls. Memory and ability to understand (cognition). Work and work Statistician. Reproductive health. Screening  You may have the following tests or measurements: Height, weight, and BMI. Blood pressure. Lipid and cholesterol levels. These may be checked every 5 years, or more frequently if you are over 59 years old. Skin check. Lung cancer screening. You may have this screening every year starting at age 19 if you have a 30-pack-year history of smoking and currently smoke or have quit within the past 15 years. Fecal occult blood test (FOBT) of the stool. You may have this test every year starting at age 21. Flexible sigmoidoscopy or colonoscopy. You may have a sigmoidoscopy every 5 years or a colonoscopy every 10 years starting at age 60. Hepatitis C blood test. Hepatitis B blood test. Sexually transmitted disease (STD) testing. Diabetes screening. This is done by checking your blood sugar (glucose) after you have not eaten for a while (fasting). You may have this done every 1-3 years. Bone density scan. This is done to screen for osteoporosis. You may have this done starting at age 62. Mammogram. This may be done every 1-2 years. Talk to your health care provider about how often you should have regular mammograms. Talk with your health care provider about your test results, treatment options, and if necessary, the need for more tests. Vaccines  Your health care provider may recommend certain vaccines, such as: Influenza vaccine. This is recommended every year. Tetanus, diphtheria, and acellular pertussis (Tdap, Td) vaccine. You may need a Td booster every 10 years. Zoster vaccine. You may need this after age 25. Pneumococcal 13-valent conjugate (PCV13) vaccine. One dose is recommended after age 62. Pneumococcal polysaccharide (PPSV23) vaccine. One dose is recommended after age 6. Talk to your health care  provider about which  screenings and vaccines you need and how often you need them. This information is not intended to replace advice given to you by your health care provider. Make sure you discuss any questions you have with your health care provider. Document Released: 04/05/2015 Document Revised: 11/27/2015 Document Reviewed: 01/08/2015 Elsevier Interactive Patient Education  2017 [place] Prevention in the Home Falls can cause injuries. They can happen to people of all ages. There are many things you can do to make your home safe and to help prevent falls. What can I do on the outside of my home? Regularly fix the edges of walkways and driveways and fix any cracks. Remove anything that might make you trip as you walk through a door, such as a raised step or threshold. Trim any bushes or trees on the path to your home. Use bright outdoor lighting. Clear any walking paths of anything that might make someone trip, such as rocks or tools. Regularly check to see if handrails are loose or broken. Make sure that both sides of any steps have handrails. Any raised decks and porches should have guardrails on the edges. Have any leaves, snow, or ice cleared regularly. Use sand or salt on walking paths during winter. Clean up any spills in your garage right away. This includes oil or grease spills. What can I do in the bathroom? Use night lights. Install grab bars by the toilet and in the tub and shower. Do not use towel bars as grab bars. Use non-skid mats or decals in the tub or shower. If you need to sit down in the shower, use a plastic, non-slip stool. Keep the floor dry. Clean up any water that spills on the floor as soon as it happens. Remove soap buildup in the tub or shower regularly. Attach bath mats securely with double-sided non-slip rug tape. Do not have throw rugs and other things on the floor that can make you trip. What can I do in the bedroom? Use night lights. Make sure that you have a  light by your bed that is easy to reach. Do not use any sheets or blankets that are too big for your bed. They should not hang down onto the floor. Have a firm chair that has side arms. You can use this for support while you get dressed. Do not have throw rugs and other things on the floor that can make you trip. What can I do in the kitchen? Clean up any spills right away. Avoid walking on wet floors. Keep items that you use a lot in easy-to-reach places. If you need to reach something above you, use a strong step stool that has a grab bar. Keep electrical cords out of the way. Do not use floor polish or wax that makes floors slippery. If you must use wax, use non-skid floor wax. Do not have throw rugs and other things on the floor that can make you trip. What can I do with my stairs? Do not leave any items on the stairs. Make sure that there are handrails on both sides of the stairs and use them. Fix handrails that are broken or loose. Make sure that handrails are as long as the stairways. Check any carpeting to make sure that it is firmly attached to the stairs. Fix any carpet that is loose or worn. Avoid having throw rugs at the top or bottom of the stairs. If you do have throw rugs, attach them to the  floor with carpet tape. Make sure that you have a light switch at the top of the stairs and the bottom of the stairs. If you do not have them, ask someone to add them for you. What else can I do to help prevent falls? Wear shoes that: Do not have high heels. Have rubber bottoms. Are comfortable and fit you well. Are closed at the toe. Do not wear sandals. If you use a stepladder: Make sure that it is fully opened. Do not climb a closed stepladder. Make sure that both sides of the stepladder are locked into place. Ask someone to hold it for you, if possible. Clearly mark and make sure that you can see: Any grab bars or handrails. First and last steps. Where the edge of each step  is. Use tools that help you move around (mobility aids) if they are needed. These include: Canes. Walkers. Scooters. Crutches. Turn on the lights when you go into a dark area. Replace any light bulbs as soon as they burn out. Set up your furniture so you have a clear path. Avoid moving your furniture around. If any of your floors are uneven, fix them. If there are any pets around you, be aware of where they are. Review your medicines with your doctor. Some medicines can make you feel dizzy. This can increase your chance of falling. Ask your doctor what other things that you can do to help prevent falls. This information is not intended to replace advice given to you by your health care provider. Make sure you discuss any questions you have with your health care provider. Document Released: 01/03/2009 Document Revised: 08/15/2015 Document Reviewed: 04/13/2014 Elsevier Interactive Patient Education  2017 [employer].

## 2020-12-04 NOTE — Progress Notes (Signed)
I connected with Virginia [name] today by telephone and verified that I am speaking with the correct person using two identifiers. Location patient: home Location provider: work Persons participating in the virtual visit: Ayzia Tabbitha, [place] LPN.   I discussed the limitations, risks, security and privacy concerns of performing an evaluation and management service by telephone and the availability of in person appointments. I also discussed with the patient that there may be a patient responsible charge related to this service. The patient expressed understanding and verbally consented to this telephonic visit.    Interactive audio and video telecommunications were attempted between this provider and patient, however failed, due to patient having technical difficulties OR patient did not have access to video capability.  We continued and completed visit with audio only.     Vital signs may be patient reported or missing.  Subjective:   Virginia [name] is a 70 y.o. female who presents for Medicare Annual (Subsequent) preventive examination.  Review of Systems     Cardiac Risk Factors include: advanced age (>8mn, >>67women)     Objective:    Today's Vitals   12/04/20 1442  Weight: 115 lb (52.2 kg)  Height: 5' 0.5" (1.537 m)   Body mass index is 22.09 kg/m.  Advanced Directives 12/04/2020 10/26/2019 10/20/2018 05/22/2015 04/24/2015 04/19/2015 04/15/2015  Does Patient Have a Medical Advance Directive? No No No No No No Yes  Copy of Healthcare Power of Attorney in Chart? - - - - No - copy requested No - copy requested -  Would patient like information on creating a medical advance directive? - Yes (MAU/Ambulatory/Procedural Areas - Information given) Yes (MAU/Ambulatory/Procedural Areas - Information given) Yes - Educational materials given No - patient declined information - -  Pre-existing out of facility DNR order (yellow form or pink MOST form) - - - - - - -    Current  Medications (verified) Outpatient Encounter Medications as of 12/04/2020  Medication Sig   Ascorbic Acid (VITAMIN C) POWD Take 2.5 g by mouth 2 (two) times daily.   B COMPLEX VITAMINS PO Take 1 tablet by mouth daily at 2 am.   Cholecalciferol (VITAMIN D PO) Take 4 drops by mouth daily.   Magnesium Glycinate 665 MG CAPS Take 1 capsule by mouth at bedtime as needed.   MAGNESIUM PO Take 1 tablet by mouth daily.   Multiple Vitamins-Minerals (ZINC PO) Take by mouth. daily   sodium chloride (OCEAN) 0.65 % nasal spray Place 1 spray into the nose daily as needed. For congestion.   UNABLE TO FIND Argentyn 23   UNABLE TO FIND Med Name: iodine 4 drops daily   zolpidem (AMBIEN) 5 MG tablet Take 1/2 tablet by mouth daily as needed   alendronate (FOSAMAX) 70 MG tablet Take 1 tablet (70 mg total) by mouth every 7 (seven) days. Take with a full glass of water on an empty stomach. (Patient not taking: Reported on 12/04/2020)   No facility-administered encounter medications on file as of 12/04/2020.    Allergies (verified) Antihistamines, diphenhydramine-type; Ofirmev [acetaminophen]; Phenergan [promethazine]; and Scopolamine   History: Past Medical History:  Diagnosis Date   Abdominal distension    Abdominal pain    Arthritis    hands, feet   Back pain with radiation    Cancer (HCC)    right breast   Cough    Dysrhythmia    rare palpitation   GERD (gastroesophageal reflux disease)    Hypothyroidism    Low back pain  with sciatica    Lymph nodes enlarged    Nasal congestion    Palpitations    Seizures (HCC)    phenergan caused it   Sore throat    Past Surgical History:  Procedure Laterality Date   APPENDECTOMY  10/03/11   BACK SURGERY     lumbar fusion   BREAST LUMPECTOMY WITH SENTINEL LYMPH NODE BIOPSY Right 04/24/2015   Procedure: RIGHT BREAST LUMPECTOMY WITH SENTINEL LYMPH NODE BIOPSY;  Surgeon: [name], MD;  Location: [facility];  Service: General;  Laterality: Right;    CESAREAN SECTION  09/09/81   LAPAROSCOPIC APPENDECTOMY  10/03/2011   Procedure: APPENDECTOMY LAPAROSCOPIC;  Surgeon: Zenovia Jarred, MD;  Location: [facility];  Service: General;  Laterality: N/A;   [place] - approximate   Family History  Problem Relation Age of Onset   Emphysema Father    Social History   Socioeconomic History   Marital status: Married    Spouse name: Not on file   Number of children: Not on file   Years of education: Not on file   Highest education level: Not on file  Occupational History   Occupation: retired  Tobacco Use   Smoking status: Never   Smokeless tobacco: Never  Vaping Use   Vaping Use: Never used  Substance and Sexual Activity   Alcohol use: Not Currently   Drug use: No   Sexual activity: Not Currently  Other Topics Concern   Not on file  Social History Narrative   Not on file   Social Determinants of Health   Financial Resource Strain: Low Risk    Difficulty of Paying Living Expenses: Not hard at all  Food Insecurity: No Food Insecurity   Worried About Charity fundraiser in the Last Year: Never true   Arboriculturist in the Last Year: Never true  Transportation Needs: No Transportation Needs   Lack of Transportation (Medical): No   Lack of Transportation (Non-Medical): No  Physical Activity: Sufficiently Active   Days of Exercise per Week: 3 days   Minutes of Exercise per Session: 60 min  Stress: No Stress Concern Present   Feeling of Stress : Not at all  Social Connections: Not on file    Tobacco Counseling Counseling given: Not Answered   Clinical Intake:  Pre-visit preparation completed: Yes  Pain : No/denies pain     Nutritional Status: BMI of 19-24  Normal Nutritional Risks: Nausea/ vomitting/ diarrhea (a little diarrhea) Diabetes: No  How often do you need to have someone help you when you read instructions, pamphlets, or other written materials from your doctor or pharmacy?: 1 - Never What  is the last grade level you completed in school?: masters degree  Diabetic? no  Interpreter Needed?: No  Information entered by :: NAllen LPN   Activities of Daily Living In your present state of health, do you have any difficulty performing the following activities: 12/04/2020 10/30/2020  Hearing? N N  Vision? N N  Difficulty concentrating or making decisions? N Y  Walking or climbing stairs? N N  Dressing or bathing? N N  Doing errands, shopping? N N  Preparing Food and eating ? N -  Using the Toilet? N -  In the past six months, have you accidently leaked urine? N -  Do you have problems with loss of bowel control? N -  Managing your Medications? N -  Managing your Finances? N -  Housekeeping or  managing your Housekeeping? N -  Some recent data might be hidden    Patient Care Team: Minette Brine, FNP as PCP - General (General Practice)  Indicate any recent Medical Services you may have received from other than [facility] providers in the past year (date may be approximate).     Assessment:   This is a routine wellness examination for Virginia [name].  Hearing/Vision screen Vision Screening - Comments:: Regular eye exams, [facility]  Dietary issues and exercise activities discussed: Current Exercise Habits: Home exercise routine, Type of exercise: yoga, Time (Minutes): 60, Frequency (Times/Week): 3, Weekly Exercise (Minutes/Week): 180   Goals Addressed             This Visit's Progress    Patient Stated       12/04/2020, wants to detox liver, help digestion       Depression Screen PHQ 2/9 Scores 12/04/2020 10/30/2020 10/26/2019 10/20/2018 04/14/2018 05/22/2015  PHQ - 2 Score 0 0 0 0 0 0  PHQ- 9 Score - - - 3 - -    Fall Risk Fall Risk  12/04/2020 10/30/2020 10/26/2019 01/02/2019 10/20/2018  Falls in the past year? 0 0 0 0 1  Comment - - - - -  Number falls in past yr: - 0 - - 1  Comment - - - - tripped over wire, tripped over a bottle of water  Injury with Fall? - 0 -  - 1  Comment - - - - injured instep and ankle  Risk for fall due to : Medication side effect No Fall Risks Other (Comment) - History of fall(s);Impaired mobility;Medication side effect  Risk for fall due to: Comment - - leg goes numb - -  Follow up Falls evaluation completed;Education provided;Falls prevention discussed Falls evaluation completed - - Falls evaluation completed;Education provided;Falls prevention discussed    FALL RISK PREVENTION PERTAINING TO THE HOME:  Any stairs in or around the home? Yes  If so, are there any without handrails? Yes  Home free of loose throw rugs in walkways, pet beds, electrical cords, etc? Yes  Adequate lighting in your home to reduce risk of falls? Yes   ASSISTIVE DEVICES UTILIZED TO PREVENT FALLS:  Life alert? No  Use of a cane, walker or w/c? No  Grab bars in the bathroom? No  Shower chair or bench in shower? No  Elevated toilet seat or a handicapped toilet? No   TIMED UP AND GO:  Was the test performed? No .       Cognitive Function:     6CIT Screen 12/04/2020 10/26/2019 10/20/2018  What Year? 0 points 0 points 0 points  What month? 0 points 0 points 0 points  What time? 0 points 0 points 0 points  Count back from 20 0 points 0 points 0 points  Months in reverse 0 points 0 points 0 points  Repeat phrase 0 points 0 points 0 points  Total Score 0 0 0    Immunizations  There is no immunization history on file for this patient.  TDAP status: Due, Education has been provided regarding the importance of this vaccine. Advised may receive this vaccine at local pharmacy or Health Dept. Aware to provide a copy of the vaccination record if obtained from local pharmacy or Health Dept. Verbalized acceptance and understanding.  Flu Vaccine status: Declined, Education has been provided regarding the importance of this vaccine but patient still declined. Advised may receive this vaccine at local pharmacy or Health Dept. Aware to provide  a copy of  the vaccination record if obtained from local pharmacy or Health Dept. Verbalized acceptance and understanding.  Pneumococcal vaccine status: Declined,  Education has been provided regarding the importance of this vaccine but patient still declined. Advised may receive this vaccine at local pharmacy or Health Dept. Aware to provide a copy of the vaccination record if obtained from local pharmacy or Health Dept. Verbalized acceptance and understanding.   Covid-19 vaccine status: Declined, Education has been provided regarding the importance of this vaccine but patient still declined. Advised may receive this vaccine at local pharmacy or Health Dept.or vaccine clinic. Aware to provide a copy of the vaccination record if obtained from local pharmacy or Health Dept. Verbalized acceptance and understanding.  Qualifies for Shingles Vaccine? Yes   Zostavax completed No   Shingrix Completed?: No.    Education has been provided regarding the importance of this vaccine. Patient has been advised to call insurance company to determine out of pocket expense if they have not yet received this vaccine. Advised may also receive vaccine at local pharmacy or Health Dept. Verbalized acceptance and understanding.  Screening Tests Health Maintenance  Topic Date Due   COVID-19 Vaccine (1) Never done   INFLUENZA VACCINE  Never done   Zoster Vaccines- Shingrix (1 of 2) 01/30/2021 (Originally 11/22/1969)   MAMMOGRAM  10/30/2021 (Originally 03/28/2017)   COLONOSCOPY (Pts 45-50yr Insurance coverage will need to be confirmed)  10/30/2021 (Originally 11/21/1996)   TETANUS/TDAP  10/30/2021 (Originally 11/22/1969)   PNA vac Low Risk Adult (1 of 2 - PCV13) 10/30/2021 (Originally 11/21/2016)   DEXA SCAN  Completed   Hepatitis C Screening  Completed   HPV VACCINES  Aged Out    Health Maintenance  Health Maintenance Due  Topic Date Due   COVID-19 Vaccine (1) Never done   INFLUENZA VACCINE  Never done    Colorectal cancer  screening: decline  Mammogram status: decline  Bone Density status: Completed 08/22/2020.  Lung Cancer Screening: (Low Dose CT Chest recommended if Age 69-80years, 30 pack-year currently smoking OR have quit w/in 15years.) does not qualify.   Lung Cancer Screening Referral: no  Additional Screening:  Hepatitis C Screening: does qualify; Completed 11/15/2019  Vision Screening: Recommended annual ophthalmology exams for early detection of glaucoma and other disorders of the eye. Is the patient up to date with their annual eye exam?  Yes  Who is the provider or what is the name of the office in which the patient attends annual eye exams? [facility] [facility] is not established with a provider, would they like to be referred to a provider to establish care? No .   Dental Screening: Recommended annual dental exams for proper oral hygiene  Community Resource Referral / Chronic Care Management: CRR required this visit?  No   CCM required this visit?  No      Plan:     I have personally reviewed and noted the following in the patient's chart:   Medical and social history Use of alcohol, tobacco or illicit drugs  Current medications and supplements including opioid prescriptions.  Functional ability and status Nutritional status Physical activity Advanced directives List of other physicians Hospitalizations, surgeries, and ER visits in previous 12 months Vitals Screenings to include cognitive, depression, and falls Referrals and appointments  In addition, I have reviewed and discussed with patient certain preventive protocols, quality metrics, and best practice recommendations. A written personalized care plan for preventive services as well as general preventive health  recommendations were provided to patient.     Kellie Simmering, LPN   [id number]   Nurse Notes:

## 2020-12-20 DIAGNOSIS — Z20822 Contact with and (suspected) exposure to covid-19: Secondary | ICD-10-CM | POA: Diagnosis not present

## 2020-12-21 DIAGNOSIS — Z20822 Contact with and (suspected) exposure to covid-19: Secondary | ICD-10-CM | POA: Diagnosis not present

## 2021-01-21 DIAGNOSIS — Z20822 Contact with and (suspected) exposure to covid-19: Secondary | ICD-10-CM | POA: Diagnosis not present

## 2021-05-08 ENCOUNTER — Other Ambulatory Visit: Payer: Self-pay

## 2021-05-08 ENCOUNTER — Encounter: Payer: Self-pay | Admitting: Nurse Practitioner

## 2021-05-08 ENCOUNTER — Ambulatory Visit (INDEPENDENT_AMBULATORY_CARE_PROVIDER_SITE_OTHER): Payer: Medicare Other | Admitting: Nurse Practitioner

## 2021-05-08 VITALS — BP 122/64 | HR 68 | Temp 98.3°F | Ht 60.5 in | Wt 112.8 lb

## 2021-05-08 DIAGNOSIS — Z853 Personal history of malignant neoplasm of breast: Secondary | ICD-10-CM

## 2021-05-08 DIAGNOSIS — Z5320 Procedure and treatment not carried out because of patient's decision for unspecified reasons: Secondary | ICD-10-CM

## 2021-05-08 DIAGNOSIS — E871 Hypo-osmolality and hyponatremia: Secondary | ICD-10-CM | POA: Diagnosis not present

## 2021-05-08 DIAGNOSIS — Z9889 Other specified postprocedural states: Secondary | ICD-10-CM | POA: Diagnosis not present

## 2021-05-08 DIAGNOSIS — Z2821 Immunization not carried out because of patient refusal: Secondary | ICD-10-CM

## 2021-05-08 DIAGNOSIS — R195 Other fecal abnormalities: Secondary | ICD-10-CM | POA: Diagnosis not present

## 2021-05-08 DIAGNOSIS — M810 Age-related osteoporosis without current pathological fracture: Secondary | ICD-10-CM | POA: Diagnosis not present

## 2021-05-08 DIAGNOSIS — G4709 Other insomnia: Secondary | ICD-10-CM | POA: Diagnosis not present

## 2021-05-08 MED ORDER — ZOLPIDEM TARTRATE 5 MG PO TABS: ORAL_TABLET | ORAL | 5 refills | Status: DC

## 2021-05-08 NOTE — Patient Instructions (Addendum)
Insomnia Insomnia is a sleep disorder that makes it difficult to fall asleep or stay asleep. Insomnia can cause fatigue, low energy, difficulty concentrating, mood swings, and poor performance at work or school. There are three different ways to classify insomnia: Difficulty falling asleep. Difficulty staying asleep. Waking up too early in the morning. Any type of insomnia can be long-term (chronic) or short-term (acute). Both are common. Short-term insomnia usually lasts for three months or less. Chronic insomnia occurs at least three times a week for longer than three months. What are the causes? Insomnia may be caused by another condition, situation, or substance, such as: Anxiety. Certain medicines. Gastroesophageal reflux disease (GERD) or other gastrointestinal conditions. Asthma or other breathing conditions. Restless legs syndrome, sleep apnea, or other sleep disorders. Chronic pain. Menopause. Stroke. Abuse of alcohol, tobacco, or illegal drugs. Mental health conditions, such as depression. Caffeine. Neurological disorders, such as Alzheimer's disease. An overactive thyroid (hyperthyroidism). Sometimes, the cause of insomnia may not be known. What increases the risk? Risk factors for insomnia include: Gender. Women are affected more often than men. Age. Insomnia is more common as you get older. Stress. Lack of exercise. Irregular work schedule or working night shifts. Traveling between different time zones. Certain medical and mental health conditions. What are the signs or symptoms? If you have insomnia, the main symptom is having trouble falling asleep or having trouble staying asleep. This may lead to other symptoms, such as: Feeling fatigued or having low energy. Feeling nervous about going to sleep. Not feeling rested in the morning. Having trouble concentrating. Feeling irritable, anxious, or depressed. How is this diagnosed? This condition may be diagnosed  based on: Your symptoms and medical history. Your health care provider may ask about: Your sleep habits. Any medical conditions you have. Your mental health. A physical exam. How is this treated? Treatment for insomnia depends on the cause. Treatment may focus on treating an underlying condition that is causing insomnia. Treatment may also include: Medicines to help you sleep. Counseling or therapy. Lifestyle adjustments to help you sleep better. Follow these instructions at home: Eating and drinking  Limit or avoid alcohol, caffeinated beverages, and cigarettes, especially close to bedtime. These can disrupt your sleep. Do not eat a large meal or eat spicy foods right before bedtime. This can lead to digestive discomfort that can make it hard for you to sleep. Sleep habits  Keep a sleep diary to help you and your health care provider figure out what could be causing your insomnia. Write down: When you sleep. When you wake up during the night. How well you sleep. How rested you feel the next day. Any side effects of medicines you are taking. What you eat and drink. Make your bedroom a dark, comfortable place where it is easy to fall asleep. Put up shades or blackout curtains to block light from outside. Use a white noise machine to block noise. Keep the temperature cool. Limit screen use before bedtime. This includes: Watching TV. Using your smartphone, tablet, or computer. Stick to a routine that includes going to bed and waking up at the same times every day and night. This can help you fall asleep faster. Consider making a quiet activity, such as reading, part of your nighttime routine. Try to avoid taking naps during the day so that you sleep better at night. Get out of bed if you are still awake after 15 minutes of trying to sleep. Keep the lights down, but try reading or doing a  quiet activity. When you feel sleepy, go back to bed. General instructions Take over-the-counter  and prescription medicines only as told by your health care provider. Exercise regularly, as told by your health care provider. Avoid exercise starting several hours before bedtime. Use relaxation techniques to manage stress. Ask your health care provider to suggest some techniques that may work well for you. These may include: Breathing exercises. Routines to release muscle tension. Visualizing peaceful scenes. Make sure that you drive carefully. Avoid driving if you feel very sleepy. Keep all follow-up visits as told by your health care provider. This is important. Contact a health care provider if: You are tired throughout the day. You have trouble in your daily routine due to sleepiness. You continue to have sleep problems, or your sleep problems get worse. Get help right away if: You have serious thoughts about hurting yourself or someone else. If you ever feel like you may hurt yourself or others, or have thoughts about taking your own life, get help right away. You can go to your nearest emergency department or call: Your local emergency services (911 in the [place] A suicide crisis helpline, such as the [facility] at 623-037-5829 or 988 in the [place]. This is open 24 hours a day. Summary Insomnia is a sleep disorder that makes it difficult to fall asleep or stay asleep. Insomnia can be long-term (chronic) or short-term (acute). Treatment for insomnia depends on the cause. Treatment may focus on treating an underlying condition that is causing insomnia. Keep a sleep diary to help you and your health care provider figure out what could be causing your insomnia. This information is not intended to replace advice given to you by your health care provider. Make sure you discuss any questions you have with your health care provider. Document Revised: 10/02/2020 Document Reviewed: 01/18/2020 Else[employer] Patient Education  [employer].  Alendronate Tablets What  is this medication? ALENDRONATE (a LEN droe nate) prevents and treats osteoporosis. It may also be used to treat Paget disease of the bone. It works by Paramedic stronger and less likely to break (fracture). It belongs to a group of medications called bisphosphonates. This medicine may be used for other purposes; ask your health care provider or pharmacist if you have questions. COMMON BRAND NAME(S): Fosamax What should I tell my care team before I take this medication? They need to know if you have any of these conditions: Bleeding disorder Cancer Dental disease Difficulty swallowing Infection (fever, chills, cough, sore throat, pain or trouble passing urine) Kidney disease Low levels of calcium or other minerals in the blood Low red blood cell counts Receiving steroids like dexamethasone or prednisone Stomach or intestine problems Trouble sitting or standing for 30 minutes An unusual or allergic reaction to alendronate, other medications, foods, dyes or preservatives Pregnant or trying to get pregnant Breast-feeding How should I use this medication? Take this medication by mouth with a full glass of water. Take it as directed on the prescription label at the same time every day. Take the dose right after waking up. Do not eat or drink anything before taking it. Do not take it with any other drink except water. Do not chew or crush the tablet. After taking it, do not eat breakfast, drink, or take any other medications or vitamins for at least 30 minutes. Sit or stand up for at least 30 minutes after you take it. Do not lie down. Keep taking it unless your care team  tells you to stop. A special MedGuide will be given to you by the pharmacist with each prescription and refill. Be sure to read this information carefully each time. Talk to your care team about the use of this medication in children. Special care may be needed. Overdosage: If you think you have taken too much of this  medicine contact a poison control center or emergency room at once. NOTE: This medicine is only for you. Do not share this medicine with others. What if I miss a dose? If you take your medication once a day, skip it. Take your next dose at the scheduled time the next morning. Do not take two doses on the same day. If you take your medication once a week, take the missed dose on the morning after you remember. Do not take two doses on the same day. What may interact with this medication? Aluminum hydroxide Antacids Aspirin Calcium supplements Medications for inflammation like ibuprofen, naproxen, and others Iron supplements Magnesium supplements Vitamins with minerals This list may not describe all possible interactions. Give your health care provider a list of all the medicines, herbs, non-prescription drugs, or dietary supplements you use. Also tell them if you smoke, drink alcohol, or use illegal drugs. Some items may interact with your medicine. What should I watch for while using this medication? Visit your care team for regular checks on your progress. It may be some time before you see the benefit from this medication. Some people who take this medication have severe bone, joint, or muscle pain. This medication may also increase your risk for jaw problems or a broken thigh bone. Tell your care team right away if you have severe pain in your jaw, bones, joints, or muscles. Tell you care team if you have any pain that does not go away or that gets worse. Tell your dentist and dental surgeon that you are taking this medication. You should not have major dental surgery while on this medication. See your dentist to have a dental exam and fix any dental problems before starting this medication. Take good care of your teeth while on this medication. Make sure you see your dentist for regular follow-up appointments. You should make sure you get enough calcium and vitamin D while you are taking this  medication. Discuss the foods you eat and the vitamins you take with your care team. You may need blood work done while you are taking this medication. What side effects may I notice from receiving this medication? Side effects that you should report to your care team as soon as possible: Allergic reactions--skin rash, itching, hives, swelling of the face, lips, tongue, or throat Low calcium level--muscle pain or cramps, confusion, tingling, or numbness in the hands or feet Osteonecrosis of the jaw--pain, swelling, or redness in the mouth, numbness of the jaw, poor healing after dental work, unusual discharge from the mouth, visible bones in the mouth Pain or trouble swallowing Severe bone, joint, or muscle pain Stomach bleeding--bloody or black, tar-like stools, vomiting blood or brown material that looks like coffee grounds Side effects that usually do not require medical attention (report to your care team if they continue or are bothersome): Constipation Diarrhea Nausea Stomach pain This list may not describe all possible side effects. Call your doctor for medical advice about side effects. You may report side effects to FDA at 1-800-FDA-1088. Where should I keep my medication? Keep out of the reach of children and pets. Store at room temperature between 15  and 30 degrees C (59 and 86 degrees F). Throw away any unused medication after the expiration date. NOTE: This sheet is a summary. It may not cover all possible information. If you have questions about this medicine, talk to your doctor, pharmacist, or health care provider.  2022 Elsevier/Gold Standard (2020-03-21 00:00:00)  Denosumab injection What is this medication? DENOSUMAB (den oh sue mab) slows bone breakdown. Prolia is used to treat osteoporosis in women after menopause and in men, and in people who are taking corticosteroids for 6 months or more. Delton See is used to treat a high calcium level due to cancer and to prevent bone  fractures and other bone problems caused by multiple myeloma or cancer bone metastases. Delton See is also used to treat giant cell tumor of the bone. This medicine may be used for other purposes; ask your health care provider or pharmacist if you have questions. COMMON BRAND NAME(S): Prolia, XGEVA What should I tell my care team before I take this medication? They need to know if you have any of these conditions: dental disease having surgery or tooth extraction infection kidney disease low levels of calcium or Vitamin D in the blood malnutrition on hemodialysis skin conditions or sensitivity thyroid or parathyroid disease an unusual reaction to denosumab, other medicines, foods, dyes, or preservatives pregnant or trying to get pregnant breast-feeding How should I use this medication? This medicine is for injection under the skin. It is given by a health care professional in a hospital or clinic setting. A special MedGuide will be given to you before each treatment. Be sure to read this information carefully each time. For Prolia, talk to your pediatrician regarding the use of this medicine in children. Special care may be needed. For Delton See, talk to your pediatrician regarding the use of this medicine in children. While this drug may be prescribed for children as young as 13 years for selected conditions, precautions do apply. Overdosage: If you think you have taken too much of this medicine contact a poison control center or emergency room at once. NOTE: This medicine is only for you. Do not share this medicine with others. What if I miss a dose? It is important not to miss your dose. Call your doctor or health care professional if you are unable to keep an appointment. What may interact with this medication? Do not take this medicine with any of the following medications: other medicines containing denosumab This medicine may also interact with the following medications: medicines that  lower your chance of fighting infection steroid medicines like prednisone or cortisone This list may not describe all possible interactions. Give your health care provider a list of all the medicines, herbs, non-prescription drugs, or dietary supplements you use. Also tell them if you smoke, drink alcohol, or use illegal drugs. Some items may interact with your medicine. What should I watch for while using this medication? Visit your doctor or health care professional for regular checks on your progress. Your doctor or health care professional may order blood tests and other tests to see how you are doing. Call your doctor or health care professional for advice if you get a fever, chills or sore throat, or other symptoms of a cold or flu. Do not treat yourself. This drug may decrease your body's ability to fight infection. Try to avoid being around people who are sick. You should make sure you get enough calcium and vitamin D while you are taking this medicine, unless your doctor tells you not  to. Discuss the foods you eat and the vitamins you take with your health care professional. See your dentist regularly. Brush and floss your teeth as directed. Before you have any dental work done, tell your dentist you are receiving this medicine. Do not become pregnant while taking this medicine or for 5 months after stopping it. Talk with your doctor or health care professional about your birth control options while taking this medicine. Women should inform their doctor if they wish to become pregnant or think they might be pregnant. There is a potential for serious side effects to an unborn child. Talk to your health care professional or pharmacist for more information. What side effects may I notice from receiving this medication? Side effects that you should report to your doctor or health care professional as soon as possible: allergic reactions like skin rash, itching or hives, swelling of the face, lips, or  tongue bone pain breathing problems dizziness jaw pain, especially after dental work redness, blistering, peeling of the skin signs and symptoms of infection like fever or chills; cough; sore throat; pain or trouble passing urine signs of low calcium like fast heartbeat, muscle cramps or muscle pain; pain, tingling, numbness in the hands or feet; seizures unusual bleeding or bruising unusually weak or tired Side effects that usually do not require medical attention (report to your doctor or health care professional if they continue or are bothersome): constipation diarrhea headache joint pain loss of appetite muscle pain runny nose tiredness upset stomach This list may not describe all possible side effects. Call your doctor for medical advice about side effects. You may report side effects to FDA at 1-800-FDA-1088. Where should I keep my medication? This medicine is only given in a clinic, doctor's office, or other health care setting and will not be stored at home. NOTE: This sheet is a summary. It may not cover all possible information. If you have questions about this medicine, talk to your doctor, pharmacist, or health care provider.  2022 Elsevier/Gold Standard (2017-07-16 00:00:00)

## 2021-05-08 NOTE — Progress Notes (Signed)
I,Tianna Badgett,acting as a Education administrator for [employer], FNP.,have documented all relevant documentation on the behalf of Minette Brine, FNP,as directed by  Minette Brine, FNP while in the presence of Minette Brine, [place].  This visit occurred during the SARS-CoV-2 public health emergency.  Safety protocols were in place, including screening questions prior to the visit, additional usage of staff PPE, and extensive cleaning of exam room while observing appropriate contact time as indicated for disinfecting solutions.  Subjective:     Patient ID: Virginia [name] , female    DOB: [birth year] , 71 y.o.   MRN: [id number]   Chief Complaint  Patient presents with   Insomnia         HPI  Patient presents today for a 6 month f/u on her insomnia. Has not seen any additional providers since her last visit. She has stopped taking the ambien back in September, she got run down a few weeks ago and caught a cold from her husband. She has restarted the medication. She has also been taking natural herbs for sleep/relaxation. She started not taking nightly, she had been taking 0.25 mg which was not effective so she stopped completely. She feels the problem is with her "nerves"   Wt Readings from Last 3 Encounters: 05/08/21 : 112 lb 12.8 oz (51.2 kg) 12/04/20 : 115 lb (52.2 kg) 10/30/20 : 114 lb 6.4 oz (51.9 kg)  She feels her weight is lower due to having a cold recently.   Insomnia Primary symptoms: sleep disturbance, difficulty falling asleep (almost an hour).   The onset quality is gradual. The problem occurs nightly. The problem is unchanged. How many beverages per day that contain caffeine: 0 - 1 (has decaf coffee or herbal tea - maybe 2 times a week she will drink caffeine).  Typical bedtime:  Other (1-130 due to  her chronic fatige).  PMH includes: no depression.     Past Medical History:  Diagnosis Date   Abdominal distension    Abdominal pain    Arthritis    hands, feet   Back pain with  radiation    Cancer (HCC)    right breast   Cough    Dysrhythmia    rare palpitation   GERD (gastroesophageal reflux disease)    Hypothyroidism    Low back pain with sciatica    Lymph nodes enlarged    Nasal congestion    Palpitations    Seizures (HCC)    phenergan caused it   Sore throat      Family History  Problem Relation Age of Onset   Emphysema Father      Current Outpatient Medications:    Ascorbic Acid (VITAMIN C) POWD, Take 2.5 g by mouth 2 (two) times daily., Disp: , Rfl:    B COMPLEX VITAMINS PO, Take 1 tablet by mouth daily at 2 am., Disp: , Rfl:    Cholecalciferol (VITAMIN D PO), Take 4 drops by mouth daily., Disp: , Rfl:    Magnesium Glycinate 665 MG CAPS, Take 1 capsule by mouth at bedtime as needed., Disp: 30 capsule, Rfl: 1   MAGNESIUM PO, Take 1 tablet by mouth daily., Disp: , Rfl:    Multiple Vitamins-Minerals (ZINC PO), Take by mouth. daily, Disp: , Rfl:    sodium chloride (OCEAN) 0.65 % nasal spray, Place 1 spray into the nose daily as needed. For congestion., Disp: , Rfl:    UNABLE TO FIND, Argentyn 23, Disp: , Rfl:    UNABLE TO  FIND, Med Name: iodine 4 drops daily, Disp: , Rfl:    zolpidem (AMBIEN) 5 MG tablet, Take 1/2 tablet by mouth daily as needed, Disp: 15 tablet, Rfl: 5   Allergies  Allergen Reactions   Antihistamines, Diphenhydramine-Type Other (See Comments)    Dehydration and fatigue    Ofirmev [Acetaminophen] Other (See Comments)    Drop in BP   Phenergan [Promethazine] Other (See Comments)    Thinks it was a seizure   Scopolamine Nausea And Vomiting     Review of Systems  Constitutional: Negative.   Respiratory: Negative.    Cardiovascular: Negative.   Gastrointestinal: Negative.   Neurological: Negative.   Psychiatric/Behavioral:  Positive for sleep disturbance. Negative for depression. The patient has insomnia.     Today's Vitals   05/08/21 1422  BP: 122/64  Pulse: 68  Temp: 98.3 F (36.8 C)  TempSrc: Other (Comment)   Weight: 112 lb 12.8 oz (51.2 kg)  Height: 5' 0.5" (1.537 m)   Body mass index is 21.67 kg/m.   Objective:  Physical Exam Vitals reviewed.  Constitutional:      General: She is not in acute distress.    Appearance: Normal appearance. She is normal weight.  Cardiovascular:     Rate and Rhythm: Normal rate and regular rhythm.     Pulses: Normal pulses.     Heart sounds: Normal heart sounds. No murmur heard. Pulmonary:     Effort: Pulmonary effort is normal. No respiratory distress.     Breath sounds: Normal breath sounds. No wheezing.  Abdominal:     General: Abdomen is flat. Bowel sounds are normal. There is no distension.     Palpations: Abdomen is soft.     Tenderness: There is no abdominal tenderness.  Musculoskeletal:        General: Normal range of motion.     Cervical back: Normal range of motion and neck supple.     Comments: She is using a cane to ambulate  Skin:    General: Skin is warm and dry.     Capillary Refill: Capillary refill takes less than 2 seconds.  Neurological:     General: No focal deficit present.     Mental Status: She is alert and oriented to person, place, and time.     Cranial Nerves: No cranial nerve deficit.     Motor: No weakness.  Psychiatric:        Mood and Affect: Mood normal.        Behavior: Behavior normal.        Thought Content: Thought content normal.        Judgment: Judgment normal.        Assessment And Plan:     1. Other insomnia Comments: Stable, continue current medications. Encouraged to not stop Ambien abruptly - zolpidem (AMBIEN) 5 MG tablet; Take 1/2 tablet by mouth daily as needed  Dispense: 15 tablet; Refill: 5  2. Hyponatremia Comments: Levels were low at last visit and did not come back in one month for repeat, she does not want to get her labs done today due to getting over a cold. I recommend she get this visit. I have advised her  - CMP14+EGFR; Future  3. Influenza vaccination declined Patient declined  influenza vaccination at this time. Patient is aware that influenza vaccine prevents illness in 70% of healthy people, and reduces hospitalizations to 30-70% in elderly. This vaccine is recommended annually. Pt is willing to accept risk associated with refusing vaccination.  4. Pneumococcal vaccination declined  5. COVID-19 vaccination declined Declines covid 19 vaccine. Discussed risk of covid 29 and if she changes her mind about the vaccine to call the office.  Encouraged to take multivitamin, vitamin d, vitamin c and zinc to increase immune system. Aware can call office if would like to have vaccine here at office.   6. Positive colorectal cancer screening using Cologuard test Comments: She continues to not want to seek treatment with a GI provider due to the positive cologuard.   7. Age-related osteoporosis without current pathological fracture She does not want to start fosamax, I have discussed the option of prolia and have given information about it to research. Discussed risk for falls.   8. History of lumpectomy of right breast  9. History of right breast cancer  10. Refuses treatment Does not want to get her labs today due to having a cold in the last week. States " I want my immune system to get better"   Patient was given opportunity to ask questions. Patient verbalized understanding of the plan and was able to repeat key elements of the plan. All questions were answered to their satisfaction.  Minette Brine, FNP   I, Minette Brine, FNP, have reviewed all documentation for this visit. The documentation on 05/08/21 for the exam, diagnosis, procedures, and orders are all accurate and complete.   IF YOU HAVE BEEN REFERRED TO A SPECIALIST, [name] TAKE 1-2 WEEKS TO SCHEDULE/PROCESS THE REFERRAL. IF YOU HAVE NOT HEARD FROM US/SPECIALIST IN TWO WEEKS, PLEASE GIVE [place] A CALL AT [phone].   THE PATIENT IS ENCOURAGED TO PRACTICE SOCIAL DISTANCING DUE TO THE COVID-19 PANDEMIC.

## 2021-05-22 ENCOUNTER — Other Ambulatory Visit: Payer: Self-pay

## 2021-05-22 ENCOUNTER — Other Ambulatory Visit: Payer: Medicare Other

## 2021-05-22 DIAGNOSIS — E871 Hypo-osmolality and hyponatremia: Secondary | ICD-10-CM | POA: Diagnosis not present

## 2021-05-23 LAB — CMP14+EGFR
ALT: 27 IU/L (ref 0–32)
AST: 30 IU/L (ref 0–40)
Albumin/Globulin Ratio: 2 (ref 1.2–2.2)
Albumin: 4.7 g/dL (ref 3.8–4.8)
Alkaline Phosphatase: 146 IU/L — ABNORMAL HIGH (ref 44–121)
BUN/Creatinine Ratio: 9 — ABNORMAL LOW (ref 12–28)
BUN: 5 mg/dL — ABNORMAL LOW (ref 8–27)
Bilirubin Total: 0.4 mg/dL (ref 0.0–1.2)
CO2: 25 mmol/L (ref 20–29)
Calcium: 9.6 mg/dL (ref 8.7–10.3)
Chloride: 96 mmol/L (ref 96–106)
Creatinine, Ser: 0.54 mg/dL — ABNORMAL LOW (ref 0.57–1.00)
Globulin, Total: 2.3 g/dL (ref 1.5–4.5)
Glucose: 83 mg/dL (ref 70–99)
Potassium: 5.2 mmol/L (ref 3.5–5.2)
Sodium: 134 mmol/L (ref 134–144)
Total Protein: 7 g/dL (ref 6.0–8.5)
eGFR: 100 mL/min/{1.73_m2} (ref >59)

## 2021-06-27 ENCOUNTER — Emergency Department (HOSPITAL_COMMUNITY)
Admission: EM | Admit: 2021-06-27 | Discharge: 2021-06-28 | Disposition: A | Discharge status: Home / Self Care | Payer: PPO | Attending: Emergency Medicine | Admitting: Emergency Medicine

## 2021-06-27 ENCOUNTER — Emergency Department (HOSPITAL_COMMUNITY): Payer: PPO

## 2021-06-27 ENCOUNTER — Other Ambulatory Visit: Payer: Self-pay

## 2021-06-27 DIAGNOSIS — M79645 Pain in left finger(s): Secondary | ICD-10-CM | POA: Diagnosis not present

## 2021-06-27 DIAGNOSIS — S0990XA Unspecified injury of head, initial encounter: Secondary | ICD-10-CM | POA: Insufficient documentation

## 2021-06-27 DIAGNOSIS — S62356A Nondisplaced fracture of shaft of fifth metacarpal bone, right hand, initial encounter for closed fracture: Secondary | ICD-10-CM | POA: Diagnosis not present

## 2021-06-27 DIAGNOSIS — M19031 Primary osteoarthritis, right wrist: Secondary | ICD-10-CM | POA: Diagnosis not present

## 2021-06-27 DIAGNOSIS — M4322 Fusion of spine, cervical region: Secondary | ICD-10-CM | POA: Diagnosis not present

## 2021-06-27 DIAGNOSIS — S62619A Displaced fracture of proximal phalanx of unspecified finger, initial encounter for closed fracture: Secondary | ICD-10-CM

## 2021-06-27 DIAGNOSIS — M79672 Pain in left foot: Secondary | ICD-10-CM | POA: Insufficient documentation

## 2021-06-27 DIAGNOSIS — S62317A Displaced fracture of base of fifth metacarpal bone. left hand, initial encounter for closed fracture: Secondary | ICD-10-CM | POA: Diagnosis not present

## 2021-06-27 DIAGNOSIS — S62616A Displaced fracture of proximal phalanx of right little finger, initial encounter for closed fracture: Secondary | ICD-10-CM | POA: Insufficient documentation

## 2021-06-27 DIAGNOSIS — M7989 Other specified soft tissue disorders: Secondary | ICD-10-CM | POA: Diagnosis not present

## 2021-06-27 DIAGNOSIS — S060X0A Concussion without loss of consciousness, initial encounter: Secondary | ICD-10-CM

## 2021-06-27 DIAGNOSIS — Z043 Encounter for examination and observation following other accident: Secondary | ICD-10-CM | POA: Diagnosis not present

## 2021-06-27 DIAGNOSIS — W01198A Fall on same level from slipping, tripping and stumbling with subsequent striking against other object, initial encounter: Secondary | ICD-10-CM | POA: Diagnosis not present

## 2021-06-27 DIAGNOSIS — S0003XA Contusion of scalp, initial encounter: Secondary | ICD-10-CM | POA: Diagnosis not present

## 2021-06-27 DIAGNOSIS — S62617A Displaced fracture of proximal phalanx of left little finger, initial encounter for closed fracture: Secondary | ICD-10-CM | POA: Diagnosis not present

## 2021-06-27 DIAGNOSIS — S6991XA Unspecified injury of right wrist, hand and finger(s), initial encounter: Secondary | ICD-10-CM | POA: Diagnosis present

## 2021-06-27 DIAGNOSIS — Z981 Arthrodesis status: Secondary | ICD-10-CM | POA: Diagnosis not present

## 2021-06-27 DIAGNOSIS — I639 Cerebral infarction, unspecified: Secondary | ICD-10-CM | POA: Diagnosis not present

## 2021-06-27 DIAGNOSIS — M4312 Spondylolisthesis, cervical region: Secondary | ICD-10-CM | POA: Diagnosis not present

## 2021-06-27 MED ORDER — OXYCODONE-ACETAMINOPHEN 5-325 MG PO TABS
1.0000 | ORAL_TABLET | Freq: Once | ORAL | Status: AC
  Administered 2021-06-27: 1 via ORAL
  Filled 2021-06-27: qty 1

## 2021-06-27 NOTE — ED Triage Notes (Signed)
Pt arrive POV with husband referred by UC for further evaluation of multiple fractures on bilateral hands after a fall yesterday. ?

## 2021-06-27 NOTE — ED Provider Notes (Signed)
?[facility] ?Provider Note ? ? ?CSN: [id number] ?Arrival date & time: 06/27/21  2024 ? ?  ? ?History ? ?Chief Complaint  ?Patient presents with  ? Fall  ? ? ?Virginia [name] is a 71 y.o. female. ? ?71 yo F who fell on Thursday night.  She hit her head and hurt both hands.  She states that she had limited dizziness and nausea which is mostly resolved today but she had significant pain in her bilateral hands pinky fingers and right proximal fourth digit.  She also has pain in her left foot in the area of her first MTP.  She states that she think she hyperflexed it and not probably why it hurts.  She went to urgent care and they told her she needed to get a reduction done on her finger so this and here for further evaluation. nboo ? ? ?Fall ? ? ?  ? ?Home Medications ?Prior to Admission medications   ?Medication Sig Start Date End Date Taking? Authorizing Provider  ?oxyCODONE-acetaminophen (PERCOCET) 5-325 MG tablet Take 0.5-1 tablets by mouth every 6 (six) hours as needed for severe pain. 06/28/21  Yes Ules Marsala, Corene Cornea, MD  ?Ascorbic Acid (VITAMIN C) POWD Take 2.5 g by mouth 2 (two) times daily.    [provider]  ?B COMPLEX VITAMINS PO Take 1 tablet by mouth daily at 2 am.    [provider]  ?Cholecalciferol (VITAMIN D PO) Take 4 drops by mouth daily.    [provider]  ?Magnesium Glycinate 665 MG CAPS Take 1 capsule by mouth at bedtime as needed. 10/30/20   Minette Brine, FNP  ?MAGNESIUM PO Take 1 tablet by mouth daily.    [provider]  ?Multiple Vitamins-Minerals (ZINC PO) Take by mouth. daily    [provider]  ?sodium chloride (OCEAN) 0.65 % nasal spray Place 1 spray into the nose daily as needed. For congestion.    [provider]  ?UNABLE TO FIND Argentyn 79    [provider]  ?UNABLE TO FIND Med Name: iodine 4 drops daily    [provider]  ?zolpidem (AMBIEN) 5 MG tablet Take 1/2 tablet by mouth  daily as needed 05/08/21   Minette Brine, [place]  ?   ? ?Allergies    ?Antihistamines, diphenhydramine-type; Ofirmev [acetaminophen]; Phenergan [promethazine]; and Scopolamine   ? ?Review of Systems   ?Review of Systems ? ?Physical Exam ?Updated Vital Signs ?BP (!) 129/100   Pulse 68   Temp 98 ?F (36.7 ?C) (Oral)   Resp 12   Ht '5\' 1"'$  (1.549 m)   Wt 51.2 kg   SpO2 100%   BMI 21.33 kg/m?  ?Physical Exam ? ?ED Results / Procedures / Treatments   ?Labs ?(all labs ordered are listed, but only abnormal results are displayed) ?Labs Reviewed - No data to display ? ?EKG ?None ? ?Radiology ?DG Forearm Right ? ?Result Date: 06/27/2021 ?CLINICAL DATA:  Status post fall. EXAM: RIGHT FOREARM - 2 VIEW COMPARISON:  None. FINDINGS: There is no evidence of fracture or other focal bone lesions. Soft tissues are unremarkable. IMPRESSION: Negative. Electronically Signed   By: Virgina Norfolk M.D.   On: 06/27/2021 22:39  ? ?DG Wrist Complete Left ? ?Result Date: 06/27/2021 ?CLINICAL DATA:  Status post fall. EXAM: LEFT WRIST - COMPLETE 3+ VIEW COMPARISON:  None. FINDINGS: An acute, mildly displaced fracture deformity is seen involving the base of the proximal phalanx of the fifth left finger. There is  no evidence of dislocation. Mild degenerative changes are seen involving the carpometacarpal articulation of the left thumb. Soft tissues are unremarkable. IMPRESSION: Acute fracture of the proximal phalanx of the fifth left finger. Electronically Signed   By: Virgina Norfolk M.D.   On: 06/27/2021 22:30  ? ?DG Wrist Complete Right ? ?Addendum Date: 06/27/2021   ?ADDENDUM REPORT: 06/27/2021 22:38 ADDENDUM: Upon further evaluation, an acute, mildly impacted fracture deformity is seen involving the base of the proximal phalanx of the fifth right finger. Electronically Signed   By: Virgina Norfolk M.D.   On: 06/27/2021 22:38  ? ?Result Date: 06/27/2021 ?CLINICAL DATA:  Status post fall. EXAM: RIGHT WRIST - COMPLETE 3+ VIEW COMPARISON:  None.  FINDINGS: There is no evidence of fracture or dislocation. Degenerative changes are seen involving the carpometacarpal articulation of the right thumb. Soft tissues are unremarkable. IMPRESSION: Degenerative changes without an acute osseous abnormality. Electronically Signed: By: Virgina Norfolk M.D. On: 06/27/2021 22:26  ? ?CT Head Wo Contrast ? ?Result Date: 06/27/2021 ?CLINICAL DATA:  Fall EXAM: CT HEAD WITHOUT CONTRAST CT CERVICAL SPINE WITHOUT CONTRAST TECHNIQUE: Multidetector CT imaging of the head and cervical spine was performed following the standard protocol without intravenous contrast. Multiplanar CT image reconstructions of the cervical spine were also generated. RADIATION DOSE REDUCTION: This exam was performed according to the departmental dose-optimization program which includes automated exposure control, adjustment of the mA and/or kV according to patient size and/or use of iterative reconstruction technique. COMPARISON:  None. FINDINGS: CT HEAD FINDINGS Brain: There is no mass, hemorrhage or extra-axial collection. The size and configuration of the ventricles and extra-axial CSF spaces are normal. The brain parenchyma is normal, without evidence of acute or chronic infarction. Vascular: No abnormal hyperdensity of the major intracranial arteries or dural venous sinuses. No intracranial atherosclerosis. Skull: The visualized skull base, calvarium and extracranial soft tissues are normal. Sinuses/Orbits: No fluid levels or advanced mucosal thickening of the visualized paranasal sinuses. No mastoid or middle ear effusion. The orbits are normal. CT CERVICAL SPINE FINDINGS Alignment: Reversal of normal cervical lordosis. Grade 1 anterolisthesis at C4-5. Skull base and vertebrae: No acute fracture. Soft tissues and spinal canal: No prevertebral fluid or swelling. No visible canal hematoma. Disc levels: Disc space narrowing is greatest at C5-6 and C6-7. The C4-5 facets are fused bilaterally. Upper chest:  No pneumothorax, pulmonary nodule or pleural effusion. Other: Normal visualized paraspinal cervical soft tissues. IMPRESSION: 1. No acute intracranial abnormality. 2. No acute fracture or static subluxation of the cervical spine. 3. Lower cervical degenerative disease with reversal of lordotic curvature. Electronically Signed   By: Ulyses Jarred M.D.   On: 06/27/2021 22:52  ? ?CT Cervical Spine Wo Contrast ? ?Result Date: 06/27/2021 ?CLINICAL DATA:  Fall EXAM: CT HEAD WITHOUT CONTRAST CT CERVICAL SPINE WITHOUT CONTRAST TECHNIQUE: Multidetector CT imaging of the head and cervical spine was performed following the standard protocol without intravenous contrast. Multiplanar CT image reconstructions of the cervical spine were also generated. RADIATION DOSE REDUCTION: This exam was performed according to the departmental dose-optimization program which includes automated exposure control, adjustment of the mA and/or kV according to patient size and/or use of iterative reconstruction technique. COMPARISON:  None. FINDINGS: CT HEAD FINDINGS Brain: There is no mass, hemorrhage or extra-axial collection. The size and configuration of the ventricles and extra-axial CSF spaces are normal. The brain parenchyma is normal, without evidence of acute or chronic infarction. Vascular: No abnormal hyperdensity of the major intracranial arteries or dural venous sinuses.  No intracranial atherosclerosis. Skull: The visualized skull base, calvarium and extracranial soft tissues are normal. Sinuses/Orbits: No fluid levels or advanced mucosal thickening of the visualized paranasal sinuses. No mastoid or middle ear effusion. The orbits are normal. CT CERVICAL SPINE FINDINGS Alignment: Reversal of normal cervical lordosis. Grade 1 anterolisthesis at C4-5. Skull base and vertebrae: No acute fracture. Soft tissues and spinal canal: No prevertebral fluid or swelling. No visible canal hematoma. Disc levels: Disc space narrowing is greatest at C5-6  and C6-7. The C4-5 facets are fused bilaterally. Upper chest: No pneumothorax, pulmonary nodule or pleural effusion. Other: Normal visualized paraspinal cervical soft tissues. IMPRESSION: 1. No acute intracrani

## 2021-06-27 NOTE — ED Notes (Signed)
Patient in xray then CT. To room after scans ?

## 2021-06-27 NOTE — ED Provider Triage Note (Signed)
Emergency Medicine Provider Triage Evaluation Note ? ?Virginia [name] , a 71 y.o. female  was evaluated in triage.  Pt complains of bilateral hand pain, left foot pain, and head pain after a fall around midnight today.  Patient reports she was carrying and zucchini when she tripped over the leg of her furniture and fell landing on both outstretched hands and she hit her head on the piece of furniture.  She denies any LOC.  She was having pain and [facility] due to the extent of her hand fractures as well as her scalp tenderness.  Denies any blood thinners.  Denies any vision changes. ?Review of Systems  ?Positive: See above ?Negative: See above ? ?Physical Exam  ?BP 133/86 (BP Location: Left Arm)   Pulse 71   Temp 98 ?F (36.7 ?C) (Oral)   Resp 18   Ht '5\' 1"'$  (1.549 m)   Wt 51.2 kg   SpO2 100%   BMI 21.33 kg/m?  ?Gen:   Awake, no distress   ?Resp:  Normal effort  ?MSK:   Moves extremities without difficulty  ?Other:  Swelling to bilateral hands. Deformity to the right little finger. Cap refill and sensation intact.  Palpable pulses.  Tenderness to the right scalp. No obvious step off or deformity. No midline c-spine tenderness.  Point tenderness to mid right forearm. ? ?Medical Decision Making  ?Medically screening exam initiated at 9:39 PM.  Appropriate orders placed.  Virginia [name] was informed that the remainder of the evaluation will be completed by another provider, this initial triage assessment does not replace that evaluation, and the importance of remaining in the ED until their evaluation is complete. ? ?Only able to see the imaging readings, but not the actual images.  We will repeat images and include the wrist as the patient has extensive swelling and tenderness.  Will order head CT and C-spine given mechanism and scalp tenderness. ?  ?Sherrell Puller, PA-C ?06/27/21 2145 ? ?

## 2021-06-28 ENCOUNTER — Telehealth: Payer: Self-pay

## 2021-06-28 DIAGNOSIS — S62616A Displaced fracture of proximal phalanx of right little finger, initial encounter for closed fracture: Secondary | ICD-10-CM | POA: Diagnosis not present

## 2021-06-28 MED ORDER — OXYCODONE-ACETAMINOPHEN 5-325 MG PO TABS: 0.5000 | ORAL_TABLET | Freq: Four times a day (QID) | ORAL | 0 refills | Status: AC | PRN

## 2021-06-28 NOTE — Progress Notes (Signed)
Orthopedic Tech Progress Note ?Patient Details:  ?Virginia [name] ?02/23/1951 ?[id number] ? ?Ortho Devices ?Type of Ortho Device: Arm sling, Ulna gutter splint ?Ortho Device/Splint Location: rue ?Ortho Device/Splint Interventions: Ordered, Application, Adjustment ?  ?Post Interventions ?Patient Tolerated: Well ?Instructions Provided: Care of device, Adjustment of device ? ?Karolee Stamps ?06/28/2021, 12:32 AM ? ?

## 2021-06-28 NOTE — Telephone Encounter (Signed)
Pharmacy [facility] called in to state that the patient was ordered percocet but has a tylenol allergy listed. She was given IV tylenol and had hypotension. Messaged dr Dayna Barker for further guidance. ?

## 2021-06-30 ENCOUNTER — Telehealth: Payer: Self-pay

## 2021-06-30 NOTE — Telephone Encounter (Signed)
Pt does not need an ED follow up appointment at this time. Pt reports having an apt with orthopedic soon. Pt notified if any questions, concerns or if she would like an appointment to give the office a call.  ?

## 2021-07-03 DIAGNOSIS — S62617A Displaced fracture of proximal phalanx of left little finger, initial encounter for closed fracture: Secondary | ICD-10-CM | POA: Diagnosis not present

## 2021-07-03 DIAGNOSIS — S62616A Displaced fracture of proximal phalanx of right little finger, initial encounter for closed fracture: Secondary | ICD-10-CM | POA: Diagnosis not present

## 2021-07-24 DIAGNOSIS — S62616A Displaced fracture of proximal phalanx of right little finger, initial encounter for closed fracture: Secondary | ICD-10-CM | POA: Diagnosis not present

## 2021-07-24 DIAGNOSIS — S62617A Displaced fracture of proximal phalanx of left little finger, initial encounter for closed fracture: Secondary | ICD-10-CM | POA: Diagnosis not present

## 2021-08-19 DIAGNOSIS — Z83518 Family history of other specified eye disorder: Secondary | ICD-10-CM | POA: Diagnosis not present

## 2021-08-19 DIAGNOSIS — H2513 Age-related nuclear cataract, bilateral: Secondary | ICD-10-CM | POA: Diagnosis not present

## 2021-08-19 DIAGNOSIS — D3132 Benign neoplasm of left choroid: Secondary | ICD-10-CM | POA: Diagnosis not present

## 2021-09-04 DIAGNOSIS — S62616D Displaced fracture of proximal phalanx of right little finger, subsequent encounter for fracture with routine healing: Secondary | ICD-10-CM | POA: Diagnosis not present

## 2021-09-04 DIAGNOSIS — S62617D Displaced fracture of proximal phalanx of left little finger, subsequent encounter for fracture with routine healing: Secondary | ICD-10-CM | POA: Diagnosis not present

## 2021-10-23 DIAGNOSIS — S62616D Displaced fracture of proximal phalanx of right little finger, subsequent encounter for fracture with routine healing: Secondary | ICD-10-CM | POA: Diagnosis not present

## 2021-10-23 DIAGNOSIS — S62617D Displaced fracture of proximal phalanx of left little finger, subsequent encounter for fracture with routine healing: Secondary | ICD-10-CM | POA: Diagnosis not present

## 2021-11-25 ENCOUNTER — Other Ambulatory Visit: Payer: Self-pay | Admitting: Nurse Practitioner

## 2021-11-25 DIAGNOSIS — G4709 Other insomnia: Secondary | ICD-10-CM

## 2021-12-24 ENCOUNTER — Ambulatory Visit (INDEPENDENT_AMBULATORY_CARE_PROVIDER_SITE_OTHER): Payer: PPO | Admitting: Nurse Practitioner

## 2021-12-24 ENCOUNTER — Ambulatory Visit (INDEPENDENT_AMBULATORY_CARE_PROVIDER_SITE_OTHER): Payer: PPO

## 2021-12-24 ENCOUNTER — Encounter: Payer: Self-pay | Admitting: Nurse Practitioner

## 2021-12-24 VITALS — BP 100/62 | HR 82 | Temp 98.1°F | Ht 64.0 in | Wt 113.0 lb

## 2021-12-24 VITALS — BP 100/62 | HR 82 | Temp 98.1°F | Ht 64.0 in | Wt 113.8 lb

## 2021-12-24 DIAGNOSIS — H579 Unspecified disorder of eye and adnexa: Secondary | ICD-10-CM | POA: Diagnosis not present

## 2021-12-24 DIAGNOSIS — Z0000 Encounter for general adult medical examination without abnormal findings: Secondary | ICD-10-CM | POA: Diagnosis not present

## 2021-12-24 DIAGNOSIS — G4709 Other insomnia: Secondary | ICD-10-CM | POA: Diagnosis not present

## 2021-12-24 DIAGNOSIS — M810 Age-related osteoporosis without current pathological fracture: Secondary | ICD-10-CM

## 2021-12-24 DIAGNOSIS — S62628D Displaced fracture of medial phalanx of other finger, subsequent encounter for fracture with routine healing: Secondary | ICD-10-CM

## 2021-12-24 MED ORDER — ZOLPIDEM TARTRATE 5 MG PO TABS: ORAL_TABLET | ORAL | 5 refills | Status: DC

## 2021-12-24 NOTE — Progress Notes (Signed)
Subjective:   ATOYA ANDREW is a 71 y.o. female who presents for Medicare Annual (Subsequent) preventive examination.  Review of Systems     Cardiac Risk Factors include: advanced age (>11mn, >>14women)     Objective:    Today's Vitals   12/24/21 1433 12/24/21 1438  BP: 100/62   Pulse: 82   Temp: 98.1 F (36.7 C)   TempSrc: Oral   SpO2: 97%   Weight: 113 lb 12.8 oz (51.6 kg)   Height: '5\' 4"'$  (1.626 m)   PainSc:  5    Body mass index is 19.53 kg/m.     12/24/2021    2:44 PM 12/04/2020    2:50 PM 10/26/2019    2:21 PM 10/20/2018    2:29 PM 05/22/2015    2:45 PM 04/24/2015   12:48 PM 04/19/2015   10:46 AM  Advanced Directives  Does Patient Have a Medical Advance Directive? No No No No No No No  Copy of Healthcare Power of Attorney in Chart?      No - copy requested No - copy requested  Would patient like information on creating a medical advance directive? No - Patient declined  Yes (MAU/Ambulatory/Procedural Areas - Information given) Yes (MAU/Ambulatory/Procedural Areas - Information given) Yes - Educational materials given No - patient declined information     Current Medications (verified) Outpatient Encounter Medications as of 12/24/2021  Medication Sig   Ascorbic Acid (VITAMIN C) POWD Take 2.5 g by mouth 2 (two) times daily.   B COMPLEX VITAMINS PO Take 1 tablet by mouth daily at 2 am.   Cholecalciferol (VITAMIN D PO) Take 4 drops by mouth daily.   Magnesium Glycinate 665 MG CAPS Take 1 capsule by mouth at bedtime as needed.   MAGNESIUM PO Take 1 tablet by mouth daily.   Multiple Vitamins-Minerals (ZINC PO) Take by mouth. daily   sodium chloride (OCEAN) 0.65 % nasal spray Place 1 spray into the nose daily as needed. For congestion.   UNABLE TO FIND Argentyn 23   UNABLE TO FIND Med Name: iodine 4 drops daily   zolpidem (AMBIEN) 5 MG tablet TAKE 1/2 TABLET BY MOUTH DAILY   oxyCODONE-acetaminophen (PERCOCET) 5-325 MG tablet Take 0.5-1 tablets by mouth every 6 (six)  hours as needed for severe pain.   No facility-administered encounter medications on file as of 12/24/2021.    Allergies (verified) Antihistamines, diphenhydramine-type; Ofirmev [acetaminophen]; Phenergan [promethazine]; and Scopolamine   History: Past Medical History:  Diagnosis Date   Abdominal distension    Abdominal pain    Arthritis    hands, feet   Back pain with radiation    Cancer (HCC)    right breast   Cough    Dysrhythmia    rare palpitation   GERD (gastroesophageal reflux disease)    Hypothyroidism    Low back pain with sciatica    Lymph nodes enlarged    Nasal congestion    Palpitations    Seizures (HCC)    phenergan caused it   Sore throat    Past Surgical History:  Procedure Laterality Date   APPENDECTOMY  10/03/11   BACK SURGERY     lumbar fusion   BREAST LUMPECTOMY WITH SENTINEL LYMPH NODE BIOPSY Right 04/24/2015   Procedure: RIGHT BREAST LUMPECTOMY WITH SENTINEL LYMPH NODE BIOPSY;  Surgeon: FStark Klein MD;  [facility]  Service: General;  Laterality: Right;   CESAREAN SECTION  09/09/81   LAPAROSCOPIC APPENDECTOMY  10/03/2011  Procedure: APPENDECTOMY LAPAROSCOPIC;  Surgeon: Zenovia Jarred, MD;  Location: [facility] OR;  Service: General;  Laterality: N/A;   [place] - approximate   Family History  Problem Relation Age of Onset   Emphysema Father    Social History   Socioeconomic History   Marital status: Married    Spouse name: Not on file   Number of children: Not on file   Years of education: Not on file   Highest education level: Not on file  Occupational History   Occupation: retired  Tobacco Use   Smoking status: Never   Smokeless tobacco: Never  Vaping Use   Vaping Use: Never used  Substance and Sexual Activity   Alcohol use: Not Currently   Drug use: No   Sexual activity: Not Currently  Other Topics Concern   Not on file  Social History Narrative   Not on file   Social Determinants of  Health   Financial Resource Strain: Low Risk  (12/24/2021)   Overall Financial Resource Strain (CARDIA)    Difficulty of Paying Living Expenses: Not hard at all  Food Insecurity: No Food Insecurity (12/24/2021)   Hunger Vital Sign    Worried About Running Out of Food in the Last Year: Never true    [place] in the Last Year: Never true  Transportation Needs: No Transportation Needs (12/24/2021)   PRAPARE - Hydrologist (Medical): No    Lack of Transportation (Non-Medical): No  Physical Activity: Insufficiently Active (12/24/2021)   Exercise Vital Sign    Days of Exercise per Week: 3 days    Minutes of Exercise per Session: 10 min  Stress: No Stress Concern Present (12/24/2021)   [place]    Feeling of Stress : Not at all  Social Connections: Not on file    Tobacco Counseling Counseling given: Not Answered   Clinical Intake:  Pre-visit preparation completed: Yes  Pain : 0-10 Pain Score: 5  Pain Type: Chronic pain Pain Location: Hip Pain Orientation: Left Pain Radiating Towards: down leg Pain Descriptors / Indicators: Shooting, Aching Pain Onset: More than a month ago Pain Frequency: Constant     Nutritional Status: BMI of 19-24  Normal Nutritional Risks: None Diabetes: No  How often do you need to have someone help you when you read instructions, pamphlets, or other written materials from your doctor or pharmacy?: 1 - Never What is the last grade level you completed in school?: master's degree  Diabetic? no  Interpreter Needed?: No  Information entered by :: NAllen LPN   Activities of Daily Living    12/24/2021    2:47 PM  In your present state of health, do you have any difficulty performing the following activities:  Hearing? 0  Vision? 1  Comment gets blurry sometimes  Difficulty concentrating or making decisions? 0  Walking or climbing stairs? 0   Dressing or bathing? 0  Doing errands, shopping? 0  Preparing Food and eating ? N  Using the Toilet? N  In the past six months, have you accidently leaked urine? N  Do you have problems with loss of bowel control? N  Managing your Medications? N  Managing your Finances? N  Housekeeping or managing your Housekeeping? N    Patient Care Team: Minette Brine, FNP as PCP - General (General Practice)  Indicate any recent Medical Services you may have received from other than [facility] providers  in the past year (date may be approximate).     Assessment:   This is a routine wellness examination for O[place]ia.  Hearing/Vision screen Vision Screening - Comments:: Regular eye exams, [facility]  Dietary issues and exercise activities discussed: Current Exercise Habits: Home exercise routine, Type of exercise: yoga;walking, Time (Minutes): 15, Frequency (Times/Week): 3, Weekly Exercise (Minutes/Week): 45   Goals Addressed             This Visit's Progress    Patient Stated       12/24/2021, wants to do more yoga and getting outside more       Depression Screen    12/24/2021    2:47 PM 12/04/2020    2:52 PM 10/30/2020    2:18 PM 10/26/2019    2:24 PM 10/20/2018    2:31 PM 04/14/2018    2:59 PM 05/22/2015    2:46 PM  PHQ 2/9 Scores  PHQ - 2 Score 0 0 0 0 0 0 0  PHQ- 9 Score     3      Fall Risk    12/24/2021    2:45 PM 12/04/2020    2:51 PM 10/30/2020    2:18 PM 10/26/2019    2:22 PM 01/02/2019    2:17 PM  Fall Risk   Falls in the past year? 1 0 0 0 0  Comment tripped      Number falls in past yr: 0  0    Injury with Fall? 1  0    Comment broke 2 fingers, concussion, and sprained left foot      Risk for fall due to : Impaired mobility Medication side effect No Fall Risks Other (Comment)   Risk for fall due to: Comment    leg goes numb   Follow up Falls evaluation completed;Education provided;Falls prevention discussed Falls evaluation completed;Education provided;Falls  prevention discussed Falls evaluation completed      FALL RISK PREVENTION PERTAINING TO THE HOME:  Any stairs in or around the home? No  If so, are there any without handrails? N/a Home free of loose throw rugs in walkways, pet beds, electrical cords, etc? Yes  Adequate lighting in your home to reduce risk of falls? Yes   ASSISTIVE DEVICES UTILIZED TO PREVENT FALLS:  Life alert? No  Use of a cane, walker or w/c? Yes  Grab bars in the bathroom? No  Shower chair or bench in shower? No  Elevated toilet seat or a handicapped toilet? No   TIMED UP AND GO:  Was the test performed? Yes .  Length of time to ambulate 10 feet: 6 sec.   Gait slow and steady with assistive device  Cognitive Function:        12/24/2021    2:49 PM 12/04/2020    2:54 PM 10/26/2019    2:27 PM 10/20/2018    2:36 PM  6CIT Screen  What Year? 0 points 0 points 0 points 0 points  What month? 0 points 0 points 0 points 0 points  What time? 0 points 0 points 0 points 0 points  Count back from 20 0 points 0 points 0 points 0 points  Months in reverse 0 points 0 points 0 points 0 points  Repeat phrase 2 points 0 points 0 points 0 points  Total Score 2 points 0 points 0 points 0 points    Immunizations  There is no immunization history on file for this patient.  TDAP status: Due, Education has been  provided regarding the importance of this vaccine. Advised may receive this vaccine at local pharmacy or Health Dept. Aware to provide a copy of the vaccination record if obtained from local pharmacy or Health Dept. Verbalized acceptance and understanding.  Flu Vaccine status: Declined, Education has been provided regarding the importance of this vaccine but patient still declined. Advised may receive this vaccine at local pharmacy or Health Dept. Aware to provide a copy of the vaccination record if obtained from local pharmacy or Health Dept. Verbalized acceptance and understanding.  Pneumococcal vaccine status:  Declined,  Education has been provided regarding the importance of this vaccine but patient still declined. Advised may receive this vaccine at local pharmacy or Health Dept. Aware to provide a copy of the vaccination record if obtained from local pharmacy or Health Dept. Verbalized acceptance and understanding.   Covid-19 vaccine status: Declined, Education has been provided regarding the importance of this vaccine but patient still declined. Advised may receive this vaccine at local pharmacy or Health Dept.or vaccine clinic. Aware to provide a copy of the vaccination record if obtained from local pharmacy or Health Dept. Verbalized acceptance and understanding.  Qualifies for Shingles Vaccine? Yes   Zostavax completed No   Shingrix Completed?: No.    Education has been provided regarding the importance of this vaccine. Patient has been advised to call insurance company to determine out of pocket expense if they have not yet received this vaccine. Advised may also receive vaccine at local pharmacy or Health Dept. Verbalized acceptance and understanding.  Screening Tests Health Maintenance  Topic Date Due   COVID-19 Vaccine (1) 01/09/2022 (Originally 11/21/1955)   Zoster Vaccines- Shingrix (1 of 2) 03/26/2022 (Originally 11/22/1970)   Pneumonia Vaccine 35+ Years old (1 - PCV) 05/08/2022 (Originally 11/21/2016)   INFLUENZA VACCINE  06/21/2022 (Originally 10/21/2021)   MAMMOGRAM  12/25/2022 (Originally 03/28/2017)   COLONOSCOPY (Pts 45-62yr Insurance coverage will need to be confirmed)  12/25/2022 (Originally 11/21/1996)   TETANUS/TDAP  12/25/2022 (Originally 11/22/1970)   DEXA SCAN  Completed   Hepatitis C Screening  Completed   HPV VACCINES  Aged Out    Health Maintenance  There are no preventive care reminders to display for this patient.   Colorectal cancer screening: decline   Mammogram status: decline  Bone Density status: Completed 08/22/2020.   Lung Cancer Screening: (Low Dose CT Chest  recommended if Age 70-80years, 30 pack-year currently smoking OR have quit w/in 15years.) does not qualify.   Lung Cancer Screening Referral: no  Additional Screening:  Hepatitis C Screening: does qualify; Completed 10/26/2019  Vision Screening: Recommended annual ophthalmology exams for early detection of glaucoma and other disorders of the eye. Is the patient up to date with their annual eye exam?  Yes  Who is the provider or what is the name of the office in which the patient attends annual eye exams[place] is not established with a provider, would they like to be referred to a provider to establish care? No .   Dental Screening: Recommended annual dental exams for proper oral hygiene  Community Resource Referral / Chronic Care Management: CRR required this visit?  No   CCM required this visit?  No      Plan:     I have personally reviewed and noted the following in the patient's chart:   Medical and social history Use of alcohol, tobacco or illicit drugs  Current medications and supplements including opioid prescriptions. Patient is not currently taking  opioid prescriptions. Functional ability and status Nutritional status Physical activity Advanced directives List of other physicians Hospitalizations, surgeries, and ER visits in previous 12 months Vitals Screenings to include cognitive, depression, and falls Referrals and appointments  In addition, I have reviewed and discussed with patient certain preventive protocols, quality metrics, and best practice recommendations. A written personalized care plan for preventive services as well as general preventive health recommendations were provided to patient.     Kellie Simmering, LPN   21/10/2881   Nurse Notes: none

## 2021-12-24 NOTE — Progress Notes (Signed)
I,[name],acting as a Education administrator for [employer], FNP.,have documented all relevant documentation on the behalf of Virginia Brine, FNP,as directed by  Virginia Brine, FNP while in the presence of Virginia [name], [place].    Subjective:     Patient ID: Virginia [name] , female    DOB: July 03, 1950 , 71 y.o.   MRN: [id number]   Chief Complaint  Patient presents with   Insomnia    HPI  Patient presents today for a 6 month f/u on her insomnia.  She had a fall in April and fractured fingers and concussion. 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 She fell over the leg of a "mini trampoline".  She lives with her husband. She "almost lost consciousness twice". She was diagnosed with a concussion. No surgery to her fingers.   Insomnia Primary symptoms: sleep disturbance, difficulty falling asleep (almost an hour).   The onset quality is gradual. The problem occurs nightly. The problem is unchanged. How many beverages per day that contain caffeine: 0 - 1 (has decaf coffee or herbal tea - maybe 2 times a week she will drink caffeine).  Typical bedtime:  Other (1-130 due to  her chronic fatige).  PMH includes: no depression.      Past Medical History:  Diagnosis Date   Abdominal distension    Abdominal pain    Arthritis    hands, feet   Back pain with radiation    Cancer (HCC)    right breast   Cough    Dysrhythmia    rare palpitation   GERD (gastroesophageal reflux disease)    Hypothyroidism    Low back pain with sciatica    Lymph nodes enlarged    Nasal congestion    Palpitations    Seizures (HCC)    phenergan caused it   Sore throat      Family History  Problem Relation Age of Onset   Emphysema Father      Current Outpatient Medications:    Ascorbic Acid (VITAMIN C) POWD, Take 2.5 g by mouth 2 (two) times daily., Disp: , Rfl:    B COMPLEX VITAMINS PO, Take 1 tablet by mouth daily at 2 am., Disp: , Rfl:    Cholecalciferol (VITAMIN D PO), Take 4 drops by mouth  daily., Disp: , Rfl:    Magnesium Glycinate 665 MG CAPS, Take 1 capsule by mouth at bedtime as needed., Disp: 30 capsule, Rfl: 1   MAGNESIUM PO, Take 1 tablet by mouth daily., Disp: , Rfl:    Multiple Vitamins-Minerals (ZINC PO), Take by mouth. daily, Disp: , Rfl:    oxyCODONE-acetaminophen (PERCOCET) 5-325 MG tablet, Take 0.5-1 tablets by mouth every 6 (six) hours as needed for severe pain., Disp: 20 tablet, Rfl: 0   sodium chloride (OCEAN) 0.65 % nasal spray, Place 1 spray into the nose daily as needed. For congestion., Disp: , Rfl:    UNABLE TO FIND, Argentyn 23, Disp: , Rfl:    UNABLE TO FIND, Med Name: iodine 4 drops daily, Disp: , Rfl:    zolpidem (AMBIEN) 5 MG tablet, TAKE 1/2 TABLET BY MOUTH DAILY, Disp: 15 tablet, Rfl: 5   Allergies  Allergen Reactions   Antihistamines, Diphenhydramine-Type Other (See Comments)    Dehydration and fatigue    Ofirmev [Acetaminophen] Other (See Comments)    Drop in BP   Phenergan [Promethazine] Other (See Comments)    Thinks it was a seizure   Scopolamine Nausea And Vomiting  Review of Systems  Constitutional: Negative.   Respiratory: Negative.    Cardiovascular: Negative.   Neurological: Negative.   Psychiatric/Behavioral:  Positive for sleep disturbance. Negative for depression. The patient has insomnia.      There were no vitals filed for this visit. There is no height or weight on file to calculate BMI.   Objective:  Physical Exam Vitals reviewed.  Constitutional:      General: She is not in acute distress.    Appearance: Normal appearance. She is normal weight.  Cardiovascular:     Rate and Rhythm: Normal rate and regular rhythm.     Pulses: Normal pulses.     Heart sounds: Normal heart sounds. No murmur heard. Pulmonary:     Effort: Pulmonary effort is normal. No respiratory distress.     Breath sounds: Normal breath sounds. No wheezing.  Abdominal:     General: Abdomen is flat. Bowel sounds are normal. There is no  distension.     Palpations: Abdomen is soft.     Tenderness: There is no abdominal tenderness.  Musculoskeletal:        General: Normal range of motion.     Cervical back: Normal range of motion and neck supple.     Comments: She is using a cane to ambulate  Skin:    General: Skin is warm and dry.     Capillary Refill: Capillary refill takes less than 2 seconds.     Comments: Skin to face appears slightly bronze in color and slightly pasty  Neurological:     General: No focal deficit present.     Mental Status: She is alert and oriented to person, place, and time.     Cranial Nerves: No cranial nerve deficit.     Motor: No weakness.  Psychiatric:        Mood and Affect: Mood normal.        Behavior: Behavior normal.        Thought Content: Thought content normal.        Judgment: Judgment normal.         Assessment And Plan:     1. Other insomnia Comments: Stable, continue current medications. Encouraged to not stop Ambien abruptly. Will try to wean her off and start Belsomra samples given - zolpidem (AMBIEN) 5 MG tablet; TAKE 1/2 TABLET BY MOUTH DAILY  Dispense: 15 tablet; Refill: 5  2. Age-related osteoporosis without current pathological fracture Comments: She declines medications for osteoprorosis. She did have finger fractures in April. Most recent bone density was in 08/2020  3. Closed displaced fracture of middle phalanx of little finger with routine healing, unspecified laterality, subsequent encounter Comments: Bilateral little finger fracture from a fall  4. Discoloration of eye Comments: She has yellow discoloration to her sclera bilaterally, will check liver functions - CMP14+EGFR - CBC     Patient was given opportunity to ask questions. Patient verbalized understanding of the plan and was able to repeat key elements of the plan. All questions were answered to their satisfaction.  Virginia Brine, FNP   I, Virginia Brine, FNP, have reviewed all documentation for  this visit. The documentation on 01/04/22 for the exam, diagnosis, procedures, and orders are all accurate and complete.   IF YOU HAVE BEEN REFERRED TO A SPECIALIST, IT [name] TAKE 1-2 WEEKS TO SCHEDULE/PROCESS THE REFERRAL. IF YOU HAVE NOT HEARD FROM US/SPECIALIST IN TWO WEEKS, PLEASE GIVE [place] A CALL AT [phone] X [phone].   THE PATIENT IS ENCOURAGED TO  PRACTICE SOCIAL DISTANCING DUE TO THE COVID-19 PANDEMIC.

## 2021-12-24 NOTE — Patient Instructions (Signed)
Ms. Son , Thank you for taking time to come for your Medicare Wellness Visit. I appreciate your ongoing commitment to your health goals. Please review the following plan we discussed and let me know if I can assist you in the future.   Screening recommendations/referrals: Colonoscopy: decline Mammogram: decline Bone Density: completed 08/22/2020 Recommended yearly ophthalmology/optometry visit for glaucoma screening and checkup Recommended yearly dental visit for hygiene and checkup  Vaccinations: Influenza vaccine: decline Pneumococcal vaccine: decline Tdap vaccine: decline Shingles vaccine: decline   Covid-19:decline  Advanced directives:  Advance directive discussed with you today. Even though you declined this today please call our office should you change your mind and we can give you the proper paperwork for you to fill out.  Conditions/risks identified: none  Next appointment: Follow up in one year for your annual wellness visit    Preventive Care 65 Years and Older, Female Preventive care refers to lifestyle choices and visits with your health care provider that can promote health and wellness. What does preventive care include? A yearly physical exam. This is also called an annual well check. Dental exams once or twice a year. Routine eye exams. Ask your health care provider how often you should have your eyes checked. Personal lifestyle choices, including: Daily care of your teeth and gums. Regular physical activity. Eating a healthy diet. Avoiding tobacco and drug use. Limiting alcohol use. Practicing safe sex. Taking low-dose aspirin every day. Taking vitamin and mineral supplements as recommended by your health care provider. What happens during an annual well check? The services and screenings done by your health care provider during your annual well check will depend on your age, overall health, lifestyle risk factors, and family history of disease. Counseling   Your health care provider may ask you questions about your: Alcohol use. Tobacco use. Drug use. Emotional well-being. Home and relationship well-being. Sexual activity. Eating habits. History of falls. Memory and ability to understand (cognition). Work and work Statistician. Reproductive health. Screening  You may have the following tests or measurements: Height, weight, and BMI. Blood pressure. Lipid and cholesterol levels. These may be checked every 5 years, or more frequently if you are over 9 years old. Skin check. Lung cancer screening. You may have this screening every year starting at age 89 if you have a 30-pack-year history of smoking and currently smoke or have quit within the past 15 years. Fecal occult blood test (FOBT) of the stool. You may have this test every year starting at age 63. Flexible sigmoidoscopy or colonoscopy. You may have a sigmoidoscopy every 5 years or a colonoscopy every 10 years starting at age 49. Hepatitis C blood test. Hepatitis B blood test. Sexually transmitted disease (STD) testing. Diabetes screening. This is done by checking your blood sugar (glucose) after you have not eaten for a while (fasting). You may have this done every 1-3 years. Bone density scan. This is done to screen for osteoporosis. You may have this done starting at age 57. Mammogram. This may be done every 1-2 years. Talk to your health care provider about how often you should have regular mammograms. Talk with your health care provider about your test results, treatment options, and if necessary, the need for more tests. Vaccines  Your health care provider may recommend certain vaccines, such as: Influenza vaccine. This is recommended every year. Tetanus, diphtheria, and acellular pertussis (Tdap, Td) vaccine. You may need a Td booster every 10 years. Zoster vaccine. You may need this after age 47.  Pneumococcal 13-valent conjugate (PCV13) vaccine. One dose is recommended  after age 3. Pneumococcal polysaccharide (PPSV23) vaccine. One dose is recommended after age 49. Talk to your health care provider about which screenings and vaccines you need and how often you need them. This information is not intended to replace advice given to you by your health care provider. Make sure you discuss any questions you have with your health care provider. Document Released: 04/05/2015 Document Revised: 11/27/2015 Document Reviewed: 01/08/2015 Elsevier Interactive Patient Education  2017 [place] Prevention in the Home Falls can cause injuries. They can happen to people of all ages. There are many things you can do to make your home safe and to help prevent falls. What can I do on the outside of my home? Regularly fix the edges of walkways and driveways and fix any cracks. Remove anything that might make you trip as you walk through a door, such as a raised step or threshold. Trim any bushes or trees on the path to your home. Use bright outdoor lighting. Clear any walking paths of anything that might make someone trip, such as rocks or tools. Regularly check to see if handrails are loose or broken. Make sure that both sides of any steps have handrails. Any raised decks and porches should have guardrails on the edges. Have any leaves, snow, or ice cleared regularly. Use sand or salt on walking paths during winter. Clean up any spills in your garage right away. This includes oil or grease spills. What can I do in the bathroom? Use night lights. Install grab bars by the toilet and in the tub and shower. Do not use towel bars as grab bars. Use non-skid mats or decals in the tub or shower. If you need to sit down in the shower, use a plastic, non-slip stool. Keep the floor dry. Clean up any water that spills on the floor as soon as it happens. Remove soap buildup in the tub or shower regularly. Attach bath mats securely with double-sided non-slip rug tape. Do not  have throw rugs and other things on the floor that can make you trip. What can I do in the bedroom? Use night lights. Make sure that you have a light by your bed that is easy to reach. Do not use any sheets or blankets that are too big for your bed. They should not hang down onto the floor. Have a firm chair that has side arms. You can use this for support while you get dressed. Do not have throw rugs and other things on the floor that can make you trip. What can I do in the kitchen? Clean up any spills right away. Avoid walking on wet floors. Keep items that you use a lot in easy-to-reach places. If you need to reach something above you, use a strong step stool that has a grab bar. Keep electrical cords out of the way. Do not use floor polish or wax that makes floors slippery. If you must use wax, use non-skid floor wax. Do not have throw rugs and other things on the floor that can make you trip. What can I do with my stairs? Do not leave any items on the stairs. Make sure that there are handrails on both sides of the stairs and use them. Fix handrails that are broken or loose. Make sure that handrails are as long as the stairways. Check any carpeting to make sure that it is firmly attached to the stairs. Fix any  carpet that is loose or worn. Avoid having throw rugs at the top or bottom of the stairs. If you do have throw rugs, attach them to the floor with carpet tape. Make sure that you have a light switch at the top of the stairs and the bottom of the stairs. If you do not have them, ask someone to add them for you. What else can I do to help prevent falls? Wear shoes that: Do not have high heels. Have rubber bottoms. Are comfortable and fit you well. Are closed at the toe. Do not wear sandals. If you use a stepladder: Make sure that it is fully opened. Do not climb a closed stepladder. Make sure that both sides of the stepladder are locked into place. Ask someone to hold it for  you, if possible. Clearly mark and make sure that you can see: Any grab bars or handrails. First and last steps. Where the edge of each step is. Use tools that help you move around (mobility aids) if they are needed. These include: Canes. Walkers. Scooters. Crutches. Turn on the lights when you go into a dark area. Replace any light bulbs as soon as they burn out. Set up your furniture so you have a clear path. Avoid moving your furniture around. If any of your floors are uneven, fix them. If there are any pets around you, be aware of where they are. Review your medicines with your doctor. Some medicines can make you feel dizzy. This can increase your chance of falling. Ask your doctor what other things that you can do to help prevent falls. This information is not intended to replace advice given to you by your health care provider. Make sure you discuss any questions you have with your health care provider. Document Released: 01/03/2009 Document Revised: 08/15/2015 Document Reviewed: 04/13/2014 Elsevier Interactive Patient Education  2017 [employer].

## 2021-12-25 LAB — CMP14+EGFR
ALT: 23 IU/L (ref 0–32)
AST: 31 IU/L (ref 0–40)
Albumin/Globulin Ratio: 1.9 (ref 1.2–2.2)
Albumin: 4.5 g/dL (ref 3.9–4.9)
Alkaline Phosphatase: 149 IU/L — ABNORMAL HIGH (ref 44–121)
BUN/Creatinine Ratio: 12 (ref 12–28)
BUN: 6 mg/dL — ABNORMAL LOW (ref 8–27)
Bilirubin Total: 0.3 mg/dL (ref 0.0–1.2)
CO2: 24 mmol/L (ref 20–29)
Calcium: 9.4 mg/dL (ref 8.7–10.3)
Chloride: 97 mmol/L (ref 96–106)
Creatinine, Ser: 0.52 mg/dL — ABNORMAL LOW (ref 0.57–1.00)
Globulin, Total: 2.4 g/dL (ref 1.5–4.5)
Glucose: 93 mg/dL (ref 70–99)
Potassium: 4.7 mmol/L (ref 3.5–5.2)
Sodium: 132 mmol/L — ABNORMAL LOW (ref 134–144)
Total Protein: 6.9 g/dL (ref 6.0–8.5)
eGFR: 100 mL/min/{1.73_m2} (ref >59)

## 2021-12-25 LAB — CBC
Hematocrit: 40.5 % (ref 34.0–46.6)
Hemoglobin: 13.4 g/dL (ref 11.1–15.9)
MCH: 30.8 pg (ref 26.6–33.0)
MCHC: 33.1 g/dL (ref 31.5–35.7)
MCV: 93 fL (ref 79–97)
Platelets: 208 10*3/uL (ref 150–450)
RBC: 4.35 x10E6/uL (ref 3.77–5.28)
RDW: 11.6 % — ABNORMAL LOW (ref 11.7–15.4)
WBC: 4.5 10*3/uL (ref 3.4–10.8)

## 2021-12-29 ENCOUNTER — Encounter: Payer: Self-pay | Admitting: Nurse Practitioner

## 2022-01-04 ENCOUNTER — Other Ambulatory Visit: Payer: Self-pay | Admitting: Nurse Practitioner

## 2022-01-04 ENCOUNTER — Encounter: Payer: Self-pay | Admitting: Nurse Practitioner

## 2022-01-04 DIAGNOSIS — H579 Unspecified disorder of eye and adnexa: Secondary | ICD-10-CM

## 2022-01-04 DIAGNOSIS — R748 Abnormal levels of other serum enzymes: Secondary | ICD-10-CM

## 2022-01-15 ENCOUNTER — Ambulatory Visit
Admission: RE | Admit: 2022-01-15 | Discharge: 2022-01-15 | Disposition: A | Discharge status: Home / Self Care | Payer: PPO | Source: Ambulatory Visit | Attending: Nurse Practitioner | Admitting: Nurse Practitioner

## 2022-01-15 DIAGNOSIS — R748 Abnormal levels of other serum enzymes: Secondary | ICD-10-CM | POA: Diagnosis not present

## 2022-01-15 DIAGNOSIS — H579 Unspecified disorder of eye and adnexa: Secondary | ICD-10-CM

## 2022-06-25 ENCOUNTER — Ambulatory Visit (INDEPENDENT_AMBULATORY_CARE_PROVIDER_SITE_OTHER): Payer: PPO | Admitting: Nurse Practitioner

## 2022-06-25 ENCOUNTER — Encounter: Payer: Self-pay | Admitting: Nurse Practitioner

## 2022-06-25 VITALS — BP 98/60 | HR 85 | Temp 98.1°F | Ht 64.0 in | Wt 114.2 lb

## 2022-06-25 DIAGNOSIS — Z2821 Immunization not carried out because of patient refusal: Secondary | ICD-10-CM | POA: Diagnosis not present

## 2022-06-25 DIAGNOSIS — G4709 Other insomnia: Secondary | ICD-10-CM

## 2022-06-25 DIAGNOSIS — Z5320 Procedure and treatment not carried out because of patient's decision for unspecified reasons: Secondary | ICD-10-CM | POA: Diagnosis not present

## 2022-06-25 MED ORDER — ZOLPIDEM TARTRATE 5 MG PO TABS: ORAL_TABLET | ORAL | 5 refills | Status: DC

## 2022-06-25 NOTE — Patient Instructions (Signed)
Insomnia Insomnia is a sleep disorder that makes it difficult to fall asleep or stay asleep. Insomnia can cause fatigue, low energy, difficulty concentrating, mood swings, and poor performance at work or school. There are three different ways to classify insomnia: Difficulty falling asleep. Difficulty staying asleep. Waking up too early in the morning. Any type of insomnia can be long-term (chronic) or short-term (acute). Both are common. Short-term insomnia usually lasts for 3 months or less. Chronic insomnia occurs at least three times a week for longer than 3 months. What are the causes? Insomnia may be caused by another condition, situation, or substance, such as: Having certain mental health conditions, such as anxiety and depression. Using caffeine, alcohol, tobacco, or drugs. Having gastrointestinal conditions, such as gastroesophageal reflux disease (GERD). Having certain medical conditions. These include: Asthma. Alzheimer's disease. Stroke. Chronic pain. An overactive thyroid gland (hyperthyroidism). Other sleep disorders, such as restless legs syndrome and sleep apnea. Menopause. Sometimes, the cause of insomnia may not be known. What increases the risk? Risk factors for insomnia include: Gender. Females are affected more often than males. Age. Insomnia is more common as people get older. Stress and certain medical and mental health conditions. Lack of exercise. Having an irregular work schedule. This may include working night shifts and traveling between different time zones. What are the signs or symptoms? If you have insomnia, the main symptom is having trouble falling asleep or having trouble staying asleep. This may lead to other symptoms, such as: Feeling tired or having low energy. Feeling nervous about going to sleep. Not feeling rested in the morning. Having trouble concentrating. Feeling irritable, anxious, or depressed. How is this diagnosed? This condition  may be diagnosed based on: Your symptoms and medical history. Your health care provider may ask about: Your sleep habits. Any medical conditions you have. Your mental health. A physical exam. How is this treated? Treatment for insomnia depends on the cause. Treatment may focus on treating an underlying condition that is causing the insomnia. Treatment may also include: Medicines to help you sleep. Counseling or therapy. Lifestyle adjustments to help you sleep better. Follow these instructions at home: Eating and drinking  Limit or avoid alcohol, caffeinated beverages, and products that contain nicotine and tobacco, especially close to bedtime. These can disrupt your sleep. Do not eat a large meal or eat spicy foods right before bedtime. This can lead to digestive discomfort that can make it hard for you to sleep. Sleep habits  Keep a sleep diary to help you and your health care provider figure out what could be causing your insomnia. Write down: When you sleep. When you wake up during the night. How well you sleep and how rested you feel the next day. Any side effects of medicines you are taking. What you eat and drink. Make your bedroom a dark, comfortable place where it is easy to fall asleep. Put up shades or blackout curtains to block light from outside. Use a white noise machine to block noise. Keep the temperature cool. Limit screen use before bedtime. This includes: Not watching TV. Not using your smartphone, tablet, or computer. Stick to a routine that includes going to bed and waking up at the same times every day and night. This can help you fall asleep faster. Consider making a quiet activity, such as reading, part of your nighttime routine. Try to avoid taking naps during the day so that you sleep better at night. Get out of bed if you are still awake after   15 minutes of trying to sleep. Keep the lights down, but try reading or doing a quiet activity. When you feel  sleepy, go back to bed. General instructions Take over-the-counter and prescription medicines only as told by your health care provider. Exercise regularly as told by your health care provider. However, avoid exercising in the hours right before bedtime. Use relaxation techniques to manage stress. Ask your health care provider to suggest some techniques that may work well for you. These may include: Breathing exercises. Routines to release muscle tension. Visualizing peaceful scenes. Make sure that you drive carefully. Do not drive if you feel very sleepy. Keep all follow-up visits. This is important. Contact a health care provider if: You are tired throughout the day. You have trouble in your daily routine due to sleepiness. You continue to have sleep problems, or your sleep problems get worse. Get help right away if: You have thoughts about hurting yourself or someone else. Get help right away if you feel like you may hurt yourself or others, or have thoughts about taking your own life. Go to your nearest emergency room or: Call 911. Call the National Suicide Prevention Lifeline at 1-800-273-8255 or 988. This is open 24 hours a day. Text the Crisis Text Line at 741741. Summary Insomnia is a sleep disorder that makes it difficult to fall asleep or stay asleep. Insomnia can be long-term (chronic) or short-term (acute). Treatment for insomnia depends on the cause. Treatment may focus on treating an underlying condition that is causing the insomnia. Keep a sleep diary to help you and your health care provider figure out what could be causing your insomnia. This information is not intended to replace advice given to you by your health care provider. Make sure you discuss any questions you have with your health care provider. Document Revised: 02/17/2021 Document Reviewed: 02/17/2021 Elsevier Patient Education  2023 Elsevier Inc.  

## 2022-06-25 NOTE — Progress Notes (Signed)
Barnet Glasgow Martin,acting as a Education administrator for Minette Brine, FNP.,have documented all relevant documentation on the behalf of Minette Brine, FNP,as directed by  Minette Brine, FNP while in the presence of Minette Brine, [place].    Subjective:     Patient ID: Virginia [name] , female    DOB: 1951/02/12 , 72 y.o.   MRN: [id number]   Chief Complaint  Patient presents with   Insomnia         HPI  Patient presents today for a  f/u on her insomnia.  Patient states compliance with medications and has no other concerns today. She did not go to GI after having a positive cologuard - was not interested in going through the process due to anesthesia. She does not the blood was from rectal area due to what she says has anal fissures.   BP Readings from Last 3 Encounters: 06/25/22 : 98/60 01/04/22 : 100/62 12/24/21 : 100/62    Insomnia Primary symptoms: sleep disturbance, difficulty falling asleep (almost an hour).   The onset quality is gradual. The problem occurs nightly. The problem is unchanged. How many beverages per day that contain caffeine: 0 - 1 (has decaf coffee or herbal tea - maybe 2 times a week she will drink caffeine).  Treatments tried: she is stretching at night as well to increase the relaxation. Typical bedtime:  Other (1-130 due to  her chronic fatige).  How long after going to bed to you fall asleep: less than 15 minutes.   Sleep duration: 30 minutes to 45 minutes.  PMH includes: no depression.      Past Medical History:  Diagnosis Date   Abdominal distension    Abdominal pain    Arthritis    hands, feet   Back pain with radiation    Cancer    right breast   Cough    Dysrhythmia    rare palpitation   GERD (gastroesophageal reflux disease)    Hypothyroidism    Low back pain with sciatica    Lymph nodes enlarged    Nasal congestion    Palpitations    Seizures    phenergan caused it   Sore throat      Family History  Problem Relation Age of Onset   Emphysema Father       Current Outpatient Medications:    Ascorbic Acid (VITAMIN C) POWD, Take 2.5 g by mouth 2 (two) times daily., Disp: , Rfl:    B COMPLEX VITAMINS PO, Take 1 tablet by mouth daily at 2 am., Disp: , Rfl:    Cholecalciferol (VITAMIN D PO), Take 4 drops by mouth daily., Disp: , Rfl:    Magnesium Glycinate 665 MG CAPS, Take 1 capsule by mouth at bedtime as needed., Disp: 30 capsule, Rfl: 1   MAGNESIUM PO, Take 1 tablet by mouth daily., Disp: , Rfl:    Multiple Vitamins-Minerals (ZINC PO), Take by mouth. daily, Disp: , Rfl:    sodium chloride (OCEAN) 0.65 % nasal spray, Place 1 spray into the nose daily as needed. For congestion., Disp: , Rfl:    UNABLE TO FIND, Argentyn 23, Disp: , Rfl:    UNABLE TO FIND, Med Name: iodine 4 drops daily, Disp: , Rfl:    oxyCODONE-acetaminophen (PERCOCET) 5-325 MG tablet, Take 0.5-1 tablets by mouth every 6 (six) hours as needed for severe pain. (Patient not taking: Reported on 06/25/2022), Disp: 20 tablet, Rfl: 0   zolpidem (AMBIEN) 5 MG tablet, TAKE 1/2 TABLET BY MOUTH  DAILY, Disp: 15 tablet, Rfl: 5   Allergies  Allergen Reactions   Antihistamines, Diphenhydramine-Type Other (See Comments)    Dehydration and fatigue    Ofirmev [Acetaminophen] Other (See Comments)    Drop in BP   Phenergan [Promethazine] Other (See Comments)    Thinks it was a seizure   Scopolamine Nausea And Vomiting     Review of Systems  Constitutional: Negative.   HENT: Negative.    Eyes: Negative.   Respiratory: Negative.    Cardiovascular: Negative.   Gastrointestinal: Negative.   Psychiatric/Behavioral:  Positive for sleep disturbance. Negative for depression. The patient has insomnia.      Today's Vitals   06/25/22 1421  BP: 98/60  Pulse: 85  Temp: 98.1 F (36.7 C)  TempSrc: Oral  Weight: 114 lb 3.2 oz (51.8 kg)  Height: 5\' 4"  (1.626 m)  PainSc: 0-No pain   Body mass index is 19.6 kg/m.  Wt Readings from Last 3 Encounters:  06/25/22 114 lb 3.2 oz (51.8 kg)   01/04/22 113 lb (51.3 kg)  12/24/21 113 lb 12.8 oz (51.6 kg)    Objective:  Physical Exam Vitals reviewed.  Constitutional:      General: She is not in acute distress.    Appearance: Normal appearance. She is normal weight.  Cardiovascular:     Rate and Rhythm: Normal rate and regular rhythm.     Pulses: Normal pulses.     Heart sounds: Normal heart sounds. No murmur heard. Pulmonary:     Effort: Pulmonary effort is normal. No respiratory distress.     Breath sounds: Normal breath sounds. No wheezing.  Abdominal:     General: Abdomen is flat. Bowel sounds are normal. There is no distension.     Palpations: Abdomen is soft.     Tenderness: There is no abdominal tenderness.  Musculoskeletal:        General: Normal range of motion.     Cervical back: Normal range of motion and neck supple.     Comments: She is using a cane to ambulate  Skin:    General: Skin is warm and dry.     Capillary Refill: Capillary refill takes less than 2 seconds.  Neurological:     General: No focal deficit present.     Mental Status: She is alert and oriented to person, place, and time.     Cranial Nerves: No cranial nerve deficit.     Motor: No weakness.  Psychiatric:        Mood and Affect: Mood normal.        Behavior: Behavior normal.        Thought Content: Thought content normal.        Judgment: Judgment normal.         Assessment And Plan:     1. Other insomnia Comments: Stable, continue current medications. Encouraged to not stop Ambien abruptly. Will try to wean her off and start Belsomra samples given - zolpidem (AMBIEN) 5 MG tablet; TAKE 1/2 TABLET BY MOUTH DAILY  Dispense: 15 tablet; Refill: 5 - BMP8+eGFR  2. Herpes zoster vaccination declined Declines shingrix, educated on disease process and is aware if he changes his mind to notify office  3. Tetanus, diphtheria, and acellular pertussis (Tdap) vaccination declined  4. Influenza vaccination declined Patient declined  influenza vaccination at this time. Patient is aware that influenza vaccine prevents illness in 70% of healthy people, and reduces hospitalizations to 30-70% in elderly. This vaccine is recommended annually. Education  has been provided regarding the importance of this vaccine but patient still declined. Advised [name] receive this vaccine at local pharmacy or Health Dept.or vaccine clinic. Aware to provide a copy of the vaccination record if obtained from local pharmacy or Health Dept.  Pt is willing to accept risk associated with refusing vaccination.  5. Screening for malignant neoplasm of colon declined Comments: declined going to GI for follow up due to having positive cologuard  6. Pneumococcal vaccination declined  7. COVID-19 vaccination declined Declines covid 19 vaccine. Discussed risk of covid 37 and if she changes her mind about the vaccine to call the office. Education has been provided regarding the importance of this vaccine but patient still declined. Advised [name] receive this vaccine at local pharmacy or Health Dept.or vaccine clinic. Aware to provide a copy of the vaccination record if obtained from local pharmacy or Health Dept.  Encouraged to take multivitamin, vitamin d, vitamin c and zinc to increase immune system. Aware can call office if would like to have vaccine here at office. Verbalized acceptance and understanding.  Continues to not be interested in any preventive immunizations   Patient was given opportunity to ask questions. Patient verbalized understanding of the plan and was able to repeat key elements of the plan. All questions were answered to their satisfaction.  Minette Brine, FNP   I, Minette Brine, FNP, have reviewed all documentation for this visit. The documentation on 06/25/22 for the exam, diagnosis, procedures, and orders are all accurate and complete.   IF YOU HAVE BEEN REFERRED TO A SPECIALIST, IT [name] TAKE 1-2 WEEKS TO SCHEDULE/PROCESS THE REFERRAL. IF YOU  HAVE NOT HEARD FROM US/SPECIALIST IN TWO WEEKS, PLEASE GIVE [place] A CALL AT [phone] X [phone].   THE PATIENT IS ENCOURAGED TO PRACTICE SOCIAL DISTANCING DUE TO THE COVID-19 PANDEMIC.

## 2022-06-26 LAB — BMP8+EGFR
BUN/Creatinine Ratio: 7 — ABNORMAL LOW (ref 12–28)
BUN: 4 mg/dL — ABNORMAL LOW (ref 8–27)
CO2: 23 mmol/L (ref 20–29)
Calcium: 9.8 mg/dL (ref 8.7–10.3)
Chloride: 96 mmol/L (ref 96–106)
Creatinine, Ser: 0.6 mg/dL (ref 0.57–1.00)
Glucose: 64 mg/dL — ABNORMAL LOW (ref 70–99)
Potassium: 4.7 mmol/L (ref 3.5–5.2)
Sodium: 132 mmol/L — ABNORMAL LOW (ref 134–144)
eGFR: 96 mL/min/{1.73_m2} (ref >59)

## 2022-12-01 DIAGNOSIS — H2513 Age-related nuclear cataract, bilateral: Secondary | ICD-10-CM | POA: Diagnosis not present

## 2022-12-01 DIAGNOSIS — C6932 Malignant neoplasm of left choroid: Secondary | ICD-10-CM | POA: Diagnosis not present

## 2022-12-03 ENCOUNTER — Other Ambulatory Visit: Payer: Self-pay | Admitting: Nurse Practitioner

## 2022-12-03 ENCOUNTER — Telehealth: Payer: Self-pay | Admitting: Nurse Practitioner

## 2022-12-03 DIAGNOSIS — C6932 Malignant neoplasm of left choroid: Secondary | ICD-10-CM

## 2022-12-03 NOTE — Progress Notes (Signed)
Received voicemail from Dr. 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 He is recommending she have CXR, CT abdomen with liver and lung. She also needs liver functions done. I have called patient and left voicemail to return call to discuss plan however have also placed the orders. She will need to come in to office for liver functions.

## 2022-12-03 NOTE — Telephone Encounter (Signed)
Called patient and discussed the plan for her CT scans and labs and xray. She will come on Monday for labs.

## 2022-12-07 ENCOUNTER — Other Ambulatory Visit: Payer: PPO

## 2022-12-07 DIAGNOSIS — C6932 Malignant neoplasm of left choroid: Secondary | ICD-10-CM | POA: Diagnosis not present

## 2022-12-08 ENCOUNTER — Ambulatory Visit
Admission: RE | Admit: 2022-12-08 | Discharge: 2022-12-08 | Disposition: A | Discharge status: Home / Self Care | Payer: PPO | Source: Ambulatory Visit | Attending: Nurse Practitioner | Admitting: Nurse Practitioner

## 2022-12-08 ENCOUNTER — Other Ambulatory Visit: Payer: Self-pay | Admitting: Nurse Practitioner

## 2022-12-08 DIAGNOSIS — C6932 Malignant neoplasm of left choroid: Secondary | ICD-10-CM

## 2022-12-08 DIAGNOSIS — I700 Atherosclerosis of aorta: Secondary | ICD-10-CM | POA: Diagnosis not present

## 2022-12-08 LAB — HEPATIC FUNCTION PANEL
ALT: 18 IU/L (ref 0–32)
AST: 26 IU/L (ref 0–40)
Albumin: 4.3 g/dL (ref 3.8–4.8)
Alkaline Phosphatase: 152 IU/L — ABNORMAL HIGH (ref 44–121)
Bilirubin Total: 0.4 mg/dL (ref 0.0–1.2)
Bilirubin, Direct: 0.14 mg/dL (ref 0.00–0.40)
Total Protein: 6.6 g/dL (ref 6.0–8.5)

## 2022-12-08 MED ORDER — IOPAMIDOL (ISOVUE-300) INJECTION 61%
100.0000 mL | Freq: Once | INTRAVENOUS | Status: AC | PRN
  Administered 2022-12-08: 100 mL via INTRAVENOUS

## 2022-12-22 DIAGNOSIS — C6932 Malignant neoplasm of left choroid: Secondary | ICD-10-CM | POA: Diagnosis not present

## 2022-12-22 DIAGNOSIS — D3132 Benign neoplasm of left choroid: Secondary | ICD-10-CM | POA: Diagnosis not present

## 2022-12-30 ENCOUNTER — Other Ambulatory Visit: Payer: Self-pay | Admitting: Nurse Practitioner

## 2022-12-30 DIAGNOSIS — G4709 Other insomnia: Secondary | ICD-10-CM

## 2022-12-31 ENCOUNTER — Encounter: Payer: Self-pay | Admitting: Nurse Practitioner

## 2022-12-31 ENCOUNTER — Other Ambulatory Visit: Payer: Self-pay

## 2022-12-31 DIAGNOSIS — G4709 Other insomnia: Secondary | ICD-10-CM

## 2022-12-31 MED ORDER — ZOLPIDEM TARTRATE 5 MG PO TABS: ORAL_TABLET | ORAL | 0 refills | Status: DC

## 2023-01-03 ENCOUNTER — Other Ambulatory Visit: Payer: Self-pay | Admitting: Nurse Practitioner

## 2023-01-03 DIAGNOSIS — G4709 Other insomnia: Secondary | ICD-10-CM

## 2023-01-04 ENCOUNTER — Other Ambulatory Visit: Payer: Self-pay | Admitting: Nurse Practitioner

## 2023-01-04 DIAGNOSIS — G4709 Other insomnia: Secondary | ICD-10-CM

## 2023-01-04 MED ORDER — ZOLPIDEM TARTRATE 5 MG PO TABS: ORAL_TABLET | ORAL | 0 refills | Status: DC

## 2023-01-07 ENCOUNTER — Ambulatory Visit: Payer: PPO

## 2023-01-07 ENCOUNTER — Ambulatory Visit: Payer: PPO | Admitting: Nurse Practitioner

## 2023-01-07 ENCOUNTER — Encounter: Payer: Self-pay | Admitting: Nurse Practitioner

## 2023-01-07 VITALS — BP 120/64 | HR 75 | Temp 98.3°F | Ht 65.4 in | Wt 112.8 lb

## 2023-01-07 VITALS — BP 120/64 | HR 75 | Temp 98.3°F | Ht 65.4 in | Wt 112.9 lb

## 2023-01-07 DIAGNOSIS — G4709 Other insomnia: Secondary | ICD-10-CM | POA: Diagnosis not present

## 2023-01-07 DIAGNOSIS — Z0000 Encounter for general adult medical examination without abnormal findings: Secondary | ICD-10-CM | POA: Diagnosis not present

## 2023-01-07 DIAGNOSIS — Z853 Personal history of malignant neoplasm of breast: Secondary | ICD-10-CM | POA: Insufficient documentation

## 2023-01-07 DIAGNOSIS — Z2821 Immunization not carried out because of patient refusal: Secondary | ICD-10-CM | POA: Diagnosis not present

## 2023-01-07 DIAGNOSIS — N949 Unspecified condition associated with female genital organs and menstrual cycle: Secondary | ICD-10-CM | POA: Diagnosis not present

## 2023-01-07 DIAGNOSIS — C6932 Malignant neoplasm of left choroid: Secondary | ICD-10-CM | POA: Diagnosis not present

## 2023-01-07 DIAGNOSIS — Z5320 Procedure and treatment not carried out because of patient's decision for unspecified reasons: Secondary | ICD-10-CM | POA: Diagnosis not present

## 2023-01-07 DIAGNOSIS — I700 Atherosclerosis of aorta: Secondary | ICD-10-CM | POA: Insufficient documentation

## 2023-01-07 MED ORDER — ZOLPIDEM TARTRATE 5 MG PO TABS
ORAL_TABLET | ORAL | 5 refills | Status: DC
Start: 2023-01-07 — End: 2023-07-12

## 2023-01-07 NOTE — Progress Notes (Signed)
Madelaine Bhat, CMA,acting as a Neurosurgeon for Arnette Felts, FNP.,have documented all relevant documentation on the behalf of Arnette Felts, FNP,as directed by  Arnette Felts, FNP while in the presence of Arnette Felts, FNP.  Subjective:  Patient ID: Virginia [name] , female    DOB: 1950-05-26 , 72 y.o.   MRN: [id number]  Chief Complaint  Patient presents with   Insomnia    HPI  Patient presents today for a insomnia follow up, Patient reports compliance with medication. Patient denies any chest pain, SOB, or headaches. Patient has no concerns today. She has been to the radiation oncologist appt and spoke with the nurse. She had an episode of nausea episode during her visit due her feeling like the smell was the cause. She has spoke with the ophthalmologist as well and she wants to hold off for a few months. She feels like she is off of her "protocol" food changes and exercise. Has appt in January for recheck of her eye.      Past Medical History:  Diagnosis Date   Abdominal distension    Abdominal pain    Allergy 02/05/1984   Chemical allergies/severe   Anxiety 02/2018   Off and on   Arthritis    hands, feet   Back pain with radiation    Cancer (HCC)    right breast   Cataract 02/2015   Mild   Cough    Dysrhythmia    rare palpitation   GERD (gastroesophageal reflux disease)    Hypothyroidism    Low back pain with sciatica    Lymph nodes enlarged    Nasal congestion    Palpitations    Seizures (HCC)    phenergan caused it   Sore throat      Family History  Problem Relation Age of Onset   Arthritis Mother    Vision loss Mother    Emphysema Father    COPD Father    Early death Father    Heart disease Father    Hypertension Father    Alcohol abuse Paternal Grandmother    Alcohol abuse Sister    Anxiety disorder Sister    Asthma Sister    COPD Sister      Current Outpatient Medications:    Ascorbic Acid (VITAMIN C) POWD, Take 2.5 g by mouth 2 (two) times daily.,  Disp: , Rfl:    B COMPLEX VITAMINS PO, Take 1 tablet by mouth daily at 2 am., Disp: , Rfl:    Cholecalciferol (VITAMIN D PO), Take 4 drops by mouth daily., Disp: , Rfl:    GOLDEN SEAL EXTRACT PO, Take by mouth., Disp: , Rfl:    LYSINE PO, Take 4 capsules by mouth daily., Disp: , Rfl:    Magnesium Glycinate 665 MG CAPS, Take 1 capsule by mouth at bedtime as needed., Disp: 30 capsule, Rfl: 1   MAGNESIUM PO, Take 1 tablet by mouth daily., Disp: , Rfl:    Melatonin 5 MG CAPS, Take 2 capsules by mouth at bedtime., Disp: , Rfl:    Multiple Vitamins-Minerals (ZINC PO), Take by mouth. daily, Disp: , Rfl:    OVER THE COUNTER MEDICATION, Lemon balm extract, Disp: , Rfl:    OVER THE COUNTER MEDICATION, [place] Poppy Extract, Disp: , Rfl:    oxyCODONE-acetaminophen (PERCOCET) 5-325 MG tablet, Take 0.5-1 tablets by mouth every 6 (six) hours as needed for severe pain. (Patient not taking: Reported on 06/25/2022), Disp: 20 tablet, Rfl: 0   sodium chloride (OCEAN)  0.65 % nasal spray, Place 1 spray into the nose daily as needed. For congestion., Disp: , Rfl:    UNABLE TO FIND, Argentyn 23, Disp: , Rfl:    UNABLE TO FIND, Med Name: iodine 4 drops daily, Disp: , Rfl:    zolpidem (AMBIEN) 5 MG tablet, TAKE 1/2 TABLET BY MOUTH DAILY, Disp: 15 tablet, Rfl: 5   Allergies  Allergen Reactions   Antihistamines, Diphenhydramine-Type Other (See Comments)    Dehydration and fatigue    Ofirmev [Acetaminophen] Other (See Comments)    Drop in BP   Phenergan [Promethazine] Other (See Comments)    Thinks it was a seizure   Scopolamine Nausea And Vomiting     Review of Systems  Constitutional: Negative.   HENT: Negative.    Eyes: Negative.   Respiratory: Negative.    Cardiovascular: Negative.   Gastrointestinal: Negative.   Neurological: Negative.   Psychiatric/Behavioral:  Positive for sleep disturbance.      Today's Vitals   01/07/23 1410  BP: 120/64  Pulse: 75  Temp: 98.3 F (36.8 C)  Weight: 112 lb  14 oz (51.2 kg)  Height: 5' 5.4" (1.661 m)   Body mass index is 18.55 kg/m.  Wt Readings from Last 3 Encounters:  01/07/23 112 lb 14 oz (51.2 kg)  01/07/23 112 lb 12.8 oz (51.2 kg)  06/25/22 114 lb 3.2 oz (51.8 kg)      Objective:  Physical Exam Vitals reviewed.  Constitutional:      General: She is not in acute distress.    Appearance: Normal appearance. She is normal weight.  Cardiovascular:     Rate and Rhythm: Normal rate and regular rhythm.     Pulses: Normal pulses.     Heart sounds: Normal heart sounds. No murmur heard. Pulmonary:     Effort: Pulmonary effort is normal. No respiratory distress.     Breath sounds: Normal breath sounds. No wheezing.  Musculoskeletal:        General: Normal range of motion.     Comments: She is using a cane to ambulate  Skin:    General: Skin is warm and dry.     Capillary Refill: Capillary refill takes less than 2 seconds.  Neurological:     General: No focal deficit present.     Mental Status: She is alert and oriented to person, place, and time.     Cranial Nerves: No cranial nerve deficit.     Motor: No weakness.  Psychiatric:        Mood and Affect: Mood normal.        Behavior: Behavior normal.        Thought Content: Thought content normal.        Judgment: Judgment normal.      Assessment And Plan:  Other insomnia Assessment & Plan: Continue Ambien, takes 1/2 tab nightly  Orders: -     CMP14+EGFR -     Zolpidem Tartrate; TAKE 1/2 TABLET BY MOUTH DAILY  Dispense: 15 tablet; Refill: 5  Adnexal cyst Assessment & Plan: She had an incidental finding of  a possible adnexal cyst, recommended to do an ultrasound.   Orders: -     US PELVIS (TRANSABDOMINAL ONLY); Future  Aortic atherosclerosis (HCC) Assessment & Plan: She is currently not on a statin, discussed the risk of not taking a statin and the importance due to having plaque build up in her vessels  Orders: -     Lipid panel  Choroid melanoma of left  eye  [facility]) Assessment & Plan: Continue f/u with Ophthalmology she will let them know if she is interested in surgery and treatment at this time she is not interested.   Orders: -     CBC  Other insomnia Assessment & Plan: Continue Ambien, takes 1/2 tab nightly  Orders: -     CMP14+EGFR -     Zolpidem Tartrate; TAKE 1/2 TABLET BY MOUTH DAILY  Dispense: 15 tablet; Refill: 5  Screening for malignant neoplasm of colon declined Assessment & Plan: She is not interested in colonoscopy or cologuard, explained risk of colon cancer.    History of right breast cancer  Tetanus, diphtheria, and acellular pertussis (Tdap) vaccination declined  Mammogram declined Assessment & Plan: Discussed risk of not getting routine mammograms especially with a history of breast cancer     Return in about 6 months (around 07/08/2023) for schedule HM in 6 months and insomnia.  Patient was given opportunity to ask questions. Patient verbalized understanding of the plan and was able to repeat key elements of the plan. All questions were answered to their satisfaction.    Jeanell Sparrow, FNP, have reviewed all documentation for this visit. The documentation on 01/07/23 for the exam, diagnosis, procedures, and orders are all accurate and complete.   IF YOU HAVE BEEN REFERRED TO A SPECIALIST, IT MAY TAKE 1-2 WEEKS TO SCHEDULE/PROCESS THE REFERRAL. IF YOU HAVE NOT HEARD FROM US/SPECIALIST IN TWO WEEKS, PLEASE GIVE [place] A CALL AT [phone].

## 2023-01-07 NOTE — Progress Notes (Signed)
Subjective:   Virginia [name] is a 72 y.o. female who presents for Medicare Annual (Subsequent) preventive examination.  Visit Complete: In person  Patient Medicare AWV questionnaire was completed by the patient on 01/07/2023; I have confirmed that all information answered by patient is correct and no changes since this date.  Cardiac Risk Factors include: advanced age (>4men, >72 women)     Objective:    Today's Vitals   01/07/23 1408  BP: 120/64  Pulse: 75  Temp: 98.3 F (36.8 C)  TempSrc: Oral  SpO2: 98%  Weight: 112 lb 12.8 oz (51.2 kg)  Height: 5' 5.4" (1.661 m)   Body mass index is 18.54 kg/m.     01/07/2023    2:20 PM 12/24/2021    2:44 PM 12/04/2020    2:50 PM 10/26/2019    2:21 PM 10/20/2018    2:29 PM 05/22/2015    2:45 PM 04/24/2015   12:48 PM  Advanced Directives  Does Patient Have a Medical Advance Directive? No No No No No No No  Copy of Healthcare Power of Attorney in Chart?       No - copy requested  Would patient like information on creating a medical advance directive?  No - Patient declined  Yes (MAU/Ambulatory/Procedural Areas - Information given) Yes (MAU/Ambulatory/Procedural Areas - Information given) Yes - Educational materials given No - patient declined information    Current Medications (verified) Outpatient Encounter Medications as of 01/07/2023  Medication Sig   Ascorbic Acid (VITAMIN C) POWD Take 2.5 g by mouth 2 (two) times daily.   B COMPLEX VITAMINS PO Take 1 tablet by mouth daily at 2 am.   Cholecalciferol (VITAMIN D PO) Take 4 drops by mouth daily.   GOLDEN SEAL EXTRACT PO Take by mouth.   LYSINE PO Take 4 capsules by mouth daily.   Magnesium Glycinate 665 MG CAPS Take 1 capsule by mouth at bedtime as needed.   MAGNESIUM PO Take 1 tablet by mouth daily.   Melatonin 5 MG CAPS Take 2 capsules by mouth at bedtime.   Multiple Vitamins-Minerals (ZINC PO) Take by mouth. daily   OVER THE COUNTER MEDICATION Lemon balm extract   OVER THE  COUNTER MEDICATION [place] Poppy Extract   sodium chloride (OCEAN) 0.65 % nasal spray Place 1 spray into the nose daily as needed. For congestion.   UNABLE TO FIND Argentyn 23   UNABLE TO FIND Med Name: iodine 4 drops daily   zolpidem (AMBIEN) 5 MG tablet TAKE 1/2 TABLET BY MOUTH DAILY   oxyCODONE-acetaminophen (PERCOCET) 5-325 MG tablet Take 0.5-1 tablets by mouth every 6 (six) hours as needed for severe pain. (Patient not taking: Reported on 06/25/2022)   No facility-administered encounter medications on file as of 01/07/2023.    Allergies (verified) Antihistamines, diphenhydramine-type; Ofirmev [acetaminophen]; Phenergan [promethazine]; and Scopolamine   History: Past Medical History:  Diagnosis Date   Abdominal distension    Abdominal pain    Allergy 02/05/1984   Chemical allergies/severe   Anxiety 02/2018   Off and on   Arthritis    hands, feet   Back pain with radiation    Cancer (HCC)    right breast   Cataract 02/2015   Mild   Cough    Dysrhythmia    rare palpitation   GERD (gastroesophageal reflux disease)    Hypothyroidism    Low back pain with sciatica    Lymph nodes enlarged    Nasal congestion    Palpitations  Seizures (HCC)    phenergan caused it   Sore throat    Past Surgical History:  Procedure Laterality Date   APPENDECTOMY  10/03/2011   BACK SURGERY     lumbar fusion   BREAST LUMPECTOMY WITH SENTINEL LYMPH NODE BIOPSY Right 04/24/2015   Procedure: RIGHT BREAST LUMPECTOMY WITH SENTINEL LYMPH NODE BIOPSY;  Surgeon: Almond Lint, MD;  Location: [facility];  Service: General;  Laterality: Right;   BREAST SURGERY  Jan./2017   Lumpectomy, [place]   CESAREAN SECTION  09/09/1981   LAPAROSCOPIC APPENDECTOMY  10/03/2011   Procedure: APPENDECTOMY LAPAROSCOPIC;  Surgeon: Liz Malady, MD;  Location: [facility] OR;  Service: General;  Laterality: N/A;   SPINE SURGERY  Nov./2013   Lower spinal fusion   WISDOM TOOTH EXTRACTION  1973 -  approximate   Family History  Problem Relation Age of Onset   Arthritis Mother    Vision loss Mother    Emphysema Father    COPD Father    Early death Father    Heart disease Father    Hypertension Father    Alcohol abuse Paternal Grandmother    Alcohol abuse Sister    Anxiety disorder Sister    Asthma Sister    COPD Sister    Social History   Socioeconomic History   Marital status: Married    Spouse name: Not on file   Number of children: Not on file   Years of education: Not on file   Highest education level: Master's degree (e.g., MA, MS, MEng, MEd, MSW, MBA)  Occupational History   Occupation: retired  Tobacco Use   Smoking status: Never   Smokeless tobacco: Never  Vaping Use   Vaping status: Never Used  Substance and Sexual Activity   Alcohol use: Never   Drug use: Never   Sexual activity: Not Currently    Birth control/protection: Post-menopausal    Comment: Vaginal dryness  Other Topics Concern   Not on file  Social History Narrative   Not on file   Social Determinants of Health   Financial Resource Strain: Low Risk  (01/07/2023)   Overall Financial Resource Strain (CARDIA)    Difficulty of Paying Living Expenses: Not very hard  Food Insecurity: No Food Insecurity (01/07/2023)   Hunger Vital Sign    Worried About Running Out of Food in the Last Year: Never true    Ran Out of Food in the Last Year: Never true  Transportation Needs: No Transportation Needs (01/07/2023)   PRAPARE - Administrator, Civil Service (Medical): No    Lack of Transportation (Non-Medical): No  Physical Activity: Insufficiently Active (01/07/2023)   Exercise Vital Sign    Days of Exercise per Week: 2 days    Minutes of Exercise per Session: 30 min  Stress: Stress Concern Present (01/07/2023)   [employer] of Occupational Health - Occupational Stress Questionnaire    Feeling of Stress : To some extent  Social Connections: Socially Isolated (01/07/2023)    Social Connection and Isolation Panel [NHANES]    Frequency of Communication with Friends and Family: Once a week    Frequency of Social Gatherings with Friends and Family: Once a week    Attends Religious Services: Never    Database administrator or Organizations: No    Attends Engineer, structural: Not on file    Marital Status: Married    Tobacco Counseling Counseling given: Not Answered   Clinical Intake:  Consulting civil engineer  completed: Yes  Pain : No/denies pain     Nutritional Status: BMI of 19-24  Normal Nutritional Risks: Nausea/ vomitting/ diarrhea (nausea with scent) Diabetes: No  How often do you need to have someone help you when you read instructions, pamphlets, or other written materials from your doctor or pharmacy?: 1 - Never  Interpreter Needed?: No  Information entered by :: NAllen LPN   Activities of Daily Living    01/07/2023    1:20 AM  In your present state of health, do you have any difficulty performing the following activities:  Hearing? 0  Vision? 0  Difficulty concentrating or making decisions? 1  Comment on occasion  Walking or climbing stairs? 0  Dressing or bathing? 0  Doing errands, shopping? 1  Comment usually shopping  Preparing Food and eating ? N  Using the Toilet? N  In the past six months, have you accidently leaked urine? N  Do you have problems with loss of bowel control? N  Managing your Medications? N  Managing your Finances? N  Housekeeping or managing your Housekeeping? N    Patient Care Team: Arnette Felts, FNP as PCP - General (General Practice)  Indicate any recent Medical Services you may have received from other than [facility] providers in the past year (date may be approximate).     Assessment:   This is a routine wellness examination for Emmalyn.  Hearing/Vision screen Hearing Screening - Comments:: Denies hearing issues Vision Screening - Comments:: Regular eye exams, [facility]    Goals Addressed             This Visit's Progress    Patient Stated       01/07/2023, on a protocol (way of life)       Depression Screen    01/07/2023    2:23 PM 12/24/2021    2:47 PM 12/04/2020    2:52 PM 10/30/2020    2:18 PM 10/26/2019    2:24 PM 10/20/2018    2:31 PM 04/14/2018    2:59 PM  PHQ 2/9 Scores  PHQ - 2 Score 0 0 0 0 0 0 0  PHQ- 9 Score      3     Fall Risk    01/07/2023    1:20 AM 12/24/2021    2:45 PM 12/04/2020    2:51 PM 10/30/2020    2:18 PM 10/26/2019    2:22 PM  Fall Risk   Falls in the past year? 0 1 0 0 0  Comment  tripped     Number falls in past yr: 0 0  0   Injury with Fall? 0 1  0   Comment  broke 2 fingers, concussion, and sprained left foot     Risk for fall due to : Impaired mobility;Impaired balance/gait Impaired mobility Medication side effect No Fall Risks Other (Comment)  Risk for fall due to: Comment     leg goes numb  Follow up Falls prevention discussed;Falls evaluation completed Falls evaluation completed;Education provided;Falls prevention discussed Falls evaluation completed;Education provided;Falls prevention discussed Falls evaluation completed     MEDICARE RISK AT HOME: Medicare Risk at Home Any stairs in or around the home?: Yes If so, are there any without handrails?: Yes Home free of loose throw rugs in walkways, pet beds, electrical cords, etc?: Yes Adequate lighting in your home to reduce risk of falls?: Yes Life alert?: No Use of a cane, walker or w/c?: Yes Grab bars in the bathroom?: No Shower  chair or bench in shower?: No Elevated toilet seat or a handicapped toilet?: No  TIMED UP AND GO:  Was the test performed?  Yes  Length of time to ambulate 10 feet: 5 sec Gait slow and steady with assistive device    Cognitive Function:        01/07/2023    2:23 PM 12/24/2021    2:49 PM 12/04/2020    2:54 PM 10/26/2019    2:27 PM 10/20/2018    2:36 PM  6CIT Screen  What Year? 0 points 0 points 0 points 0 points 0 points   What month? 0 points 0 points 0 points 0 points 0 points  What time? 0 points 0 points 0 points 0 points 0 points  Count back from 20 0 points 0 points 0 points 0 points 0 points  Months in reverse 0 points 0 points 0 points 0 points 0 points  Repeat phrase 2 points 2 points 0 points 0 points 0 points  Total Score 2 points 2 points 0 points 0 points 0 points    Immunizations  There is no immunization history on file for this patient.  TDAP status: Due, Education has been provided regarding the importance of this vaccine. Advised may receive this vaccine at local pharmacy or Health Dept. Aware to provide a copy of the vaccination record if obtained from local pharmacy or Health Dept. Verbalized acceptance and understanding.  Flu Vaccine status: Declined, Education has been provided regarding the importance of this vaccine but patient still declined. Advised may receive this vaccine at local pharmacy or Health Dept. Aware to provide a copy of the vaccination record if obtained from local pharmacy or Health Dept. Verbalized acceptance and understanding.  Pneumococcal vaccine status: Declined,  Education has been provided regarding the importance of this vaccine but patient still declined. Advised may receive this vaccine at local pharmacy or Health Dept. Aware to provide a copy of the vaccination record if obtained from local pharmacy or Health Dept. Verbalized acceptance and understanding.   Covid-19 vaccine status: Declined, Education has been provided regarding the importance of this vaccine but patient still declined. Advised may receive this vaccine at local pharmacy or Health Dept.or vaccine clinic. Aware to provide a copy of the vaccination record if obtained from local pharmacy or Health Dept. Verbalized acceptance and understanding.  Qualifies for Shingles Vaccine? Yes   Zostavax completed No   Shingrix Completed?: No.    Education has been provided regarding the importance of this  vaccine. Patient has been advised to call insurance company to determine out of pocket expense if they have not yet received this vaccine. Advised may also receive vaccine at local pharmacy or Health Dept. Verbalized acceptance and understanding.  Screening Tests Health Maintenance  Topic Date Due   DTaP/Tdap/Td (1 - Tdap) Never done   Colonoscopy  Never done   MAMMOGRAM  03/28/2017   COVID-19 Vaccine (1) 01/23/2023 (Originally 11/21/1955)   Zoster Vaccines- Shingrix (1 of 2) 04/09/2023 (Originally 11/22/1969)   INFLUENZA VACCINE  06/21/2023 (Originally 10/22/2022)   Pneumonia Vaccine 51+ Years old (1 of 1 - PCV) 06/25/2023 (Originally 11/21/2016)   Medicare Annual Wellness (AWV)  01/07/2024   DEXA SCAN  Completed   Hepatitis C Screening  Completed   HPV VACCINES  Aged Out    Health Maintenance  Health Maintenance Due  Topic Date Due   DTaP/Tdap/Td (1 - Tdap) Never done   Colonoscopy  Never done   MAMMOGRAM  03/28/2017  Colorectal cancer screening: declines  Mammogram status: declines  Bone Density status: Completed 08/22/2020.   Lung Cancer Screening: (Low Dose CT Chest recommended if Age 3-80 years, 20 pack-year currently smoking OR have quit w/in 15years.) does not qualify.   Lung Cancer Screening Referral: no  Additional Screening:  Hepatitis C Screening: does qualify; Completed 10/26/2019  Vision Screening: Recommended annual ophthalmology exams for early detection of glaucoma and other disorders of the eye. Is the patient up to date with their annual eye exam?  Yes  Who is the provider or what is the name of the office in which the patient attends annual eye exams? [facility] If pt is not established with a provider, would they like to be referred to a provider to establish care? No .   Dental Screening: Recommended annual dental exams for proper oral hygiene  Diabetic Foot Exam: n/a  Community Resource Referral / Chronic Care Management: CRR required this  visit?  No   CCM required this visit?  No     Plan:     I have personally reviewed and noted the following in the patient's chart:   Medical and social history Use of alcohol, tobacco or illicit drugs  Current medications and supplements including opioid prescriptions. Patient is not currently taking opioid prescriptions. Functional ability and status Nutritional status Physical activity Advanced directives List of other physicians Hospitalizations, surgeries, and ER visits in previous 12 months Vitals Screenings to include cognitive, depression, and falls Referrals and appointments  In addition, I have reviewed and discussed with patient certain preventive protocols, quality metrics, and best practice recommendations. A written personalized care plan for preventive services as well as general preventive health recommendations were provided to patient.     Barb Merino, LPN   16/12/9602   After Visit Summary: (In Person-Printed) AVS printed and given to the patient  Nurse Notes: none

## 2023-01-07 NOTE — Patient Instructions (Signed)
Virginia [name] , Thank you for taking time to come for your Medicare Wellness Visit. I appreciate your ongoing commitment to your health goals. Please review the following plan we discussed and let me know if I can assist you in the future.   Referrals/Orders/Follow-Ups/Clinician Recommendations: none  This is a list of the screening recommended for you and due dates:  Health Maintenance  Topic Date Due   DTaP/Tdap/Td vaccine (1 - Tdap) Never done   Colon Cancer Screening  Never done   Mammogram  03/28/2017   COVID-19 Vaccine (1) 01/23/2023*   Zoster (Shingles) Vaccine (1 of 2) 04/09/2023*   Flu Shot  06/21/2023*   Pneumonia Vaccine (1 of 1 - PCV) 06/25/2023*   Medicare Annual Wellness Visit  01/07/2024   DEXA scan (bone density measurement)  Completed   Hepatitis C Screening  Completed   HPV Vaccine  Aged Out  *Topic was postponed. The date shown is not the original due date.    Advanced directives: (Declined) Advance directive discussed with you today. Even though you declined this today, please call our office should you change your mind, and we can give you the proper paperwork for you to fill out.  Next Medicare Annual Wellness Visit scheduled for next year: No, office will schedule appointment  Insert Preventive Care attachment Insert FALL PREVENTION attachment if needed

## 2023-01-08 LAB — LIPID PANEL
Chol/HDL Ratio: 2.9 {ratio} (ref 0.0–4.4)
Cholesterol, Total: 158 mg/dL (ref 100–199)
HDL: 54 mg/dL (ref >39)
LDL Chol Calc (NIH): 84 mg/dL (ref 0–99)
Triglycerides: 113 mg/dL (ref 0–149)
VLDL Cholesterol Cal: 20 mg/dL (ref 5–40)

## 2023-01-08 LAB — CBC
Hematocrit: 39.5 % (ref 34.0–46.6)
Hemoglobin: 13.1 g/dL (ref 11.1–15.9)
MCH: 31.5 pg (ref 26.6–33.0)
MCHC: 33.2 g/dL (ref 31.5–35.7)
MCV: 95 fL (ref 79–97)
Platelets: 245 10*3/uL (ref 150–450)
RBC: 4.16 x10E6/uL (ref 3.77–5.28)
RDW: 11.9 % (ref 11.7–15.4)
WBC: 3.7 10*3/uL (ref 3.4–10.8)

## 2023-01-08 LAB — CMP14+EGFR
ALT: 18 [IU]/L (ref 0–32)
AST: 26 [IU]/L (ref 0–40)
Albumin: 4.2 g/dL (ref 3.8–4.8)
Alkaline Phosphatase: 156 [IU]/L — ABNORMAL HIGH (ref 44–121)
BUN/Creatinine Ratio: 7 — ABNORMAL LOW (ref 12–28)
BUN: 4 mg/dL — ABNORMAL LOW (ref 8–27)
Bilirubin Total: 0.3 mg/dL (ref 0.0–1.2)
CO2: 25 mmol/L (ref 20–29)
Calcium: 9.6 mg/dL (ref 8.7–10.3)
Chloride: 98 mmol/L (ref 96–106)
Creatinine, Ser: 0.54 mg/dL — ABNORMAL LOW (ref 0.57–1.00)
Globulin, Total: 2.3 g/dL (ref 1.5–4.5)
Glucose: 70 mg/dL (ref 70–99)
Potassium: 4.8 mmol/L (ref 3.5–5.2)
Sodium: 134 mmol/L (ref 134–144)
Total Protein: 6.5 g/dL (ref 6.0–8.5)
eGFR: 98 mL/min/{1.73_m2} (ref >59)

## 2023-01-12 ENCOUNTER — Ambulatory Visit
Admission: RE | Admit: 2023-01-12 | Discharge: 2023-01-12 | Disposition: A | Discharge status: Home / Self Care | Payer: PPO | Source: Ambulatory Visit | Attending: Nurse Practitioner | Admitting: Nurse Practitioner

## 2023-01-12 DIAGNOSIS — N83291 Other ovarian cyst, right side: Secondary | ICD-10-CM | POA: Diagnosis not present

## 2023-01-12 DIAGNOSIS — N949 Unspecified condition associated with female genital organs and menstrual cycle: Secondary | ICD-10-CM

## 2023-01-12 DIAGNOSIS — N83292 Other ovarian cyst, left side: Secondary | ICD-10-CM | POA: Diagnosis not present

## 2023-01-19 DIAGNOSIS — Z5320 Procedure and treatment not carried out because of patient's decision for unspecified reasons: Secondary | ICD-10-CM | POA: Insufficient documentation

## 2023-01-19 NOTE — Assessment & Plan Note (Signed)
She is not interested in colonoscopy or cologuard, explained risk of colon cancer.

## 2023-01-19 NOTE — Assessment & Plan Note (Signed)
Continue Ambien, takes 1/2 tab nightly

## 2023-01-19 NOTE — Assessment & Plan Note (Signed)
She is currently not on a statin, discussed the risk of not taking a statin and the importance due to having plaque build up in her vessels

## 2023-01-19 NOTE — Assessment & Plan Note (Signed)
She had an incidental finding of  a possible adnexal cyst, recommended to do an ultrasound.

## 2023-01-19 NOTE — Assessment & Plan Note (Signed)
Discussed risk of not getting routine mammograms especially with a history of breast cancer

## 2023-01-19 NOTE — Assessment & Plan Note (Signed)
Continue f/u with Ophthalmology she will let them know if she is interested in surgery and treatment at this time she is not interested.

## 2023-04-06 DIAGNOSIS — H2513 Age-related nuclear cataract, bilateral: Secondary | ICD-10-CM | POA: Diagnosis not present

## 2023-04-06 DIAGNOSIS — C6932 Malignant neoplasm of left choroid: Secondary | ICD-10-CM | POA: Diagnosis not present

## 2023-04-06 DIAGNOSIS — H35373 Puckering of macula, bilateral: Secondary | ICD-10-CM | POA: Diagnosis not present

## 2023-04-21 DIAGNOSIS — C6932 Malignant neoplasm of left choroid: Secondary | ICD-10-CM | POA: Diagnosis not present

## 2023-04-26 DIAGNOSIS — C6932 Malignant neoplasm of left choroid: Secondary | ICD-10-CM | POA: Diagnosis not present

## 2023-04-26 DIAGNOSIS — Z853 Personal history of malignant neoplasm of breast: Secondary | ICD-10-CM | POA: Diagnosis not present

## 2023-04-26 DIAGNOSIS — Z981 Arthrodesis status: Secondary | ICD-10-CM | POA: Diagnosis not present

## 2023-04-30 NOTE — H&P (Signed)
 ------------------------------------------------------------------------------- Attestation signed by Jordan Arthur Ueberroth, MD at 04/30/2023  7:53 AM No interval changes to H+P.  Proceed with radioactive plaque removal left eye  Jordan A. Ueberroth, MD Vitreoretinal Service Ophthalmology [facility]  -------------------------------------------------------------------------------  History & Physical  Attending: Dr. Lorrene Pugh  Chief complaint: Choroidal Melanoma, Left  History of Present Illness:  Virginia [name] is a 73 y.o. female who recently is here to discussed risks, benefits, alternatives and indication for Left I-125 radiation plaque removal under monitored anesthesia care.  Patient here today for surgical H&P. Anesthesia consult will occur on DOS in the holding room.    Past Medical History:  Diagnosis Date  . Age-related nuclear cataract, bilateral 04/05/2018  . Arthritis   . Breast cancer, right (CMD) 04/24/2015   s/p lumpectomy only  . 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 Nevus of choroid of left eye 11/27/2014  . PONV (postoperative nausea and vomiting)   . Seizure (CMD)    x1 after taking phenergan    Past Surgical History:  Procedure Laterality Date  . APPENDECTOMY  2013  . BREAST LUMPECTOMY Right 2017  . CESAREAN SECTION  1983  . GUM SURGERY    . LUMBAR FUSION  2013  . RADIOACTIVE PLAQUE REMOVAL Left 04/26/2023   IMPLANT BRACHYTHERAPY INTRAOCULAR W/Cryo performed by Lorrene Ozell Pugh, MD at [facility] OR  . WISDOM TOOTH EXTRACTION  1973    Family History  Problem Relation Name Age of Onset  . Macular degeneration Mother    . Stroke Father    . Hypertension Father    . Cataracts Sister      Social History   Tobacco Use  . Smoking status: Never  . Smokeless tobacco: Never  Vaping Use  . Vaping status: Never Used  Substance Use Topics   . Alcohol use: Not Currently  . Drug use: No    Allergies  Allergen Reactions  . Promethazine Anaphylaxis and Other (See Comments)    Thinks it was a seizure  . Eggshell Membrane GI Intolerance and Nausea And Vomiting    EGGS, Mozzarella Cheese.       Spices  Such as Cumin,  Termeric cause severe GERD.  . Acetaminophen  Other (See Comments)    Drop in BP  . Dimenhydrinate Fatigue    Dehydration and fatigue  . Scopolamine  GI Intolerance    Review of Systems: See ophthalmologist notes, history present illness and past medical history otherwise all other systems reviewed and are negative.  Physical Exam: Vitals:   04/29/23 2309 04/30/23 0412 04/30/23 0728 04/30/23 0735  BP: 126/87 131/81  155/89  BP Location: Left arm Left arm    Patient Position: Sitting Lying    Pulse: 65 62  67  Resp: 18 16  16   Temp: 97.5 F (36.4 C) 98.1 F (36.7 C)  97.2 F (36.2 C)  TempSrc: Oral Oral    SpO2: 97% 98%  98%  Weight:   49.4 kg (109 lb) 49.4 kg (109 lb)  Height:   1.651 m (5' 5) 1.651 m (5' 5)   General:   Alert and oriented, appears stated age  Eyes:  See clinic note  ENT:  WNL  Neck:  Supple  Lungs:   Symmetric chest rise, no respiratory distress  Heart:   Regular rate and rhythm  Abdomen:  Soft, non-tender, non-distended   Extremities:  Extremities normal, atraumatic, no cyanosis or edema  Neurologic:   No focal deficits    ASSESSMENT/PLAN:   # choroidal melanoma, left - Removal of I-125 radiation plaque - s/p biopsy and plaque application on 04/26/23 - Anesthesia consult will occur DOS in the holding room - Home after surgery with family  Chyrl Monte MD Ophthalmology Resident

## 2023-05-03 ENCOUNTER — Telehealth: Payer: Self-pay

## 2023-05-03 NOTE — Transitions of Care (Post Inpatient/ED Visit) (Signed)
   05/03/2023  Name: Virginia [name] MRN: [id number] DOB: 1950/10/04  Today's TOC FU Call Status: Today's TOC FU Call Status:: Unsuccessful Call (1st Attempt) Unsuccessful Call (1st Attempt) Date: 05/03/23  Attempted to reach the patient regarding the most recent Inpatient/ED visit.  Follow Up Plan: Additional outreach attempts will be made to reach the patient to complete the Transitions of Care (Post Inpatient/ED visit) call.   Signature Agnes Lawrence, CMA (AAMA)  [facility] [phone]

## 2023-05-05 DIAGNOSIS — C6932 Malignant neoplasm of left choroid: Secondary | ICD-10-CM | POA: Diagnosis not present

## 2023-05-05 DIAGNOSIS — H2513 Age-related nuclear cataract, bilateral: Secondary | ICD-10-CM | POA: Diagnosis not present

## 2023-05-05 NOTE — Transitions of Care (Post Inpatient/ED Visit) (Signed)
   05/05/2023  Name: Virginia [name] MRN: [id number] DOB: 08-30-1950  Today's TOC FU Call Status: Today's TOC FU Call Status:: Unsuccessful Call (2nd Attempt) Unsuccessful Call (1st Attempt) Date: 05/03/23 Unsuccessful Call (2nd Attempt) Date: 05/05/23  Attempted to reach the patient regarding the most recent Inpatient/ED visit.  Follow Up Plan: Additional outreach attempts will be made to reach the patient to complete the Transitions of Care (Post Inpatient/ED visit) call.   Signature  Agnes Lawrence, CMA (AAMA)  [facility] [phone]

## 2023-05-11 DIAGNOSIS — C6932 Malignant neoplasm of left choroid: Secondary | ICD-10-CM | POA: Diagnosis not present

## 2023-07-07 ENCOUNTER — Other Ambulatory Visit: Payer: Self-pay | Admitting: Nurse Practitioner

## 2023-07-07 DIAGNOSIS — G4709 Other insomnia: Secondary | ICD-10-CM

## 2023-07-07 NOTE — Progress Notes (Signed)
 [place], CMA,acting as a Neurosurgeon for Virginia Epley, FNP.,have documented all relevant documentation on the behalf of Virginia Epley, FNP,as directed by  Virginia Epley, FNP while in the presence of Virginia Epley, FNP.  Subjective:    Patient ID: Virginia [name] , female    DOB: 01-05-1951 , 73 y.o.   MRN: [id number]  Chief Complaint  Patient presents with   Annual Exam    HPI  Patient presents today for HM, Patient reports compliance with medication. Patient denies any chest pain, SOB, or headaches. Patient has no concerns today. She had surgery for her left eye for cancer and put a radiation patch was in hospitalized for 5 days. It had been slow getting back active . She had 2 different rounds of anesthesia. Her next f/u with ophthalmology May 2nd, will redo imaging.   Reports she is being more affected by chemicals causing her to feel like her throat is closing up. She is also having intermittent vomiting.      Past Medical History:  Diagnosis Date   Abdominal distension    Abdominal pain    Age-related osteoporosis without current pathological fracture 07/08/2023   Allergy 02/05/1984   Chemical allergies/severe   Anxiety 02/2018   Off and on   Arthritis    hands, feet   Back pain with radiation    Cancer (HCC)    right breast   Cataract 02/2015   Mild   Cough    Dysrhythmia    rare palpitation   Encounter for annual health examination 07/08/2023   GERD (gastroesophageal reflux disease)    Hypothyroidism    Low back pain with sciatica    Lymph nodes enlarged    Nasal congestion    Palpitations    Seizures (HCC)    phenergan caused it   Sore throat      Family History  Problem Relation Age of Onset   Arthritis Mother    Vision loss Mother    Emphysema Father    COPD Father    Early death Father    Heart disease Father    Hypertension Father    Alcohol abuse Paternal Grandmother    Alcohol abuse Sister    Anxiety disorder Sister    Asthma Sister    COPD  Sister    Early death Brother    Varicose Veins Maternal Aunt      Current Outpatient Medications:    albuterol  (VENTOLIN  HFA) 108 (90 Base) MCG/ACT inhaler, Inhale 2 puffs into the lungs every 6 (six) hours as needed for wheezing or shortness of breath., Disp: 8 g, Rfl: 2   B COMPLEX VITAMINS PO, Take 1 tablet by mouth daily at 2 am., Disp: , Rfl:    EPINEPHrine  (EPIPEN  2-PAK) 0.3 mg/0.3 mL IJ SOAJ injection, Inject 0.3 mg into the muscle as needed for anaphylaxis., Disp: 1 each, Rfl: 2   GOLDEN SEAL EXTRACT PO, Take by mouth., Disp: , Rfl:    LYSINE PO, Take 4 capsules by mouth daily., Disp: , Rfl:    Magnesium  Glycinate 665 MG CAPS, Take 1 capsule by mouth at bedtime as needed., Disp: 30 capsule, Rfl: 1   Melatonin 5 MG CAPS, Take 2 capsules by mouth at bedtime., Disp: , Rfl:    Multiple Vitamins-Minerals (ZINC PO), Take by mouth. daily, Disp: , Rfl:    OVER THE COUNTER MEDICATION, Lemon balm extract, Disp: , Rfl:    OVER THE COUNTER MEDICATION, [place]  Poppy Extract, Disp: , Rfl:  sodium chloride  (OCEAN) 0.65 % nasal spray, Place 1 spray into the nose daily as needed. For congestion., Disp: , Rfl:    UNABLE TO FIND, Argentyn 23, Disp: , Rfl:    UNABLE TO FIND, Med Name: iodine 4 drops daily, Disp: , Rfl:    Ascorbic Acid  (VITAMIN C) POWD, Take 2.5 g by mouth 2 (two) times daily. (Patient not taking: Reported on 07/08/2023), Disp: , Rfl:    Cholecalciferol (VITAMIN D PO), Take 4 drops by mouth daily. (Patient not taking: Reported on 07/08/2023), Disp: , Rfl:    MAGNESIUM  PO, Take 1 tablet by mouth daily. (Patient not taking: Reported on 07/08/2023), Disp: , Rfl:    oxyCODONE -acetaminophen  (PERCOCET) 5-325 MG tablet, Take 0.5-1 tablets by mouth every 6 (six) hours as needed for severe pain. (Patient not taking: Reported on 07/08/2023), Disp: 20 tablet, Rfl: 0   zolpidem  (AMBIEN ) 5 MG tablet, TAKE 1/2 TABLET BY MOUTH DAILY, Disp: 15 tablet, Rfl: 5   Allergies  Allergen Reactions    Antihistamines, Diphenhydramine-Type Other (See Comments)    Dehydration and fatigue    Ofirmev  [Acetaminophen ] Other (See Comments)    Drop in BP   Phenergan [Promethazine] Other (See Comments)    Thinks it was a seizure   Scopolamine  Nausea And Vomiting      The patient states she uses post menopausal status for birth control. No LMP recorded. Patient is postmenopausal.  Negative for Dysmenorrhea and Negative for Menorrhagia. Negative for: breast discharge, breast lump(s), breast pain and breast self exam. Associated symptoms include abnormal vaginal bleeding. Pertinent negatives include abnormal bleeding (hematology), anxiety, decreased libido, depression, difficulty falling sleep, dyspareunia, history of infertility, nocturia, sexual dysfunction, sleep disturbances, urinary incontinence, urinary urgency, vaginal discharge and vaginal itching. Diet regular; healthy.  The patient states her exercise level is minimal with yoga.   The patient's tobacco use is:  Social History   Tobacco Use  Smoking Status Never  Smokeless Tobacco Never  She has been exposed to passive smoke. The patient's alcohol use is:  Social History   Substance and Sexual Activity  Alcohol Use Never      Review of Systems  Constitutional: Negative.   HENT: Negative.    Eyes:  Positive for redness (left eye related to recent procedure for cancer).  Respiratory: Negative.    Cardiovascular: Negative.   Gastrointestinal: Negative.   Endocrine: Negative.   Genitourinary: Negative.   Allergic/Immunologic: Negative.   Neurological: Negative.   Hematological: Negative.   Psychiatric/Behavioral: Negative.       Today's Vitals   07/08/23 1426  BP: 100/60  Pulse: 78  Temp: 98.4 F (36.9 C)  TempSrc: Oral  Weight: 112 lb (50.8 kg)  Height: 5\' 5"  (1.651 m)  PainSc: 0-No pain   Body mass index is 18.64 kg/m.  Wt Readings from Last 3 Encounters:  07/08/23 112 lb (50.8 kg)  01/07/23 112 lb 14 oz (51.2 kg)   01/07/23 112 lb 12.8 oz (51.2 kg)     Objective:  Physical Exam Vitals and nursing note reviewed.  Constitutional:      General: She is not in acute distress.    Appearance: Normal appearance. She is well-developed. She is obese.  HENT:     Head: Normocephalic and atraumatic.     Right Ear: Hearing, tympanic membrane, ear canal and external ear normal. There is no impacted cerumen.     Left Ear: Hearing, tympanic membrane, ear canal and external ear normal. There is no impacted cerumen.  Nose: Nose normal.     Mouth/Throat:     Mouth: Mucous membranes are moist.  Eyes:     General: Lids are normal.     Extraocular Movements: Extraocular movements intact.     Conjunctiva/sclera: Conjunctivae normal.     Pupils: Pupils are equal, round, and reactive to light.     Funduscopic exam:    Right eye: No papilledema.        Left eye: No papilledema.  Neck:     Thyroid : No thyroid  mass.     Vascular: No carotid bruit.  Cardiovascular:     Rate and Rhythm: Normal rate and regular rhythm.     Pulses: Normal pulses.     Heart sounds: Normal heart sounds. No murmur heard. Pulmonary:     Effort: Pulmonary effort is normal.     Breath sounds: Normal breath sounds.  Chest:     Chest wall: No mass.  Breasts:    Tanner Score is 5.     Right: Normal. No mass or tenderness.     Left: Normal. No mass or tenderness.  Abdominal:     General: Abdomen is flat. Bowel sounds are normal. There is no distension.     Palpations: Abdomen is soft.     Tenderness: There is no abdominal tenderness.  Genitourinary:    Rectum: Guaiac result negative.  Musculoskeletal:        General: No swelling. Normal range of motion.     Cervical back: Full passive range of motion without pain, normal range of motion and neck supple.     Right lower leg: No edema.     Left lower leg: No edema.  Lymphadenopathy:     Upper Body:     Right upper body: No supraclavicular, axillary or pectoral adenopathy.      Left upper body: No supraclavicular, axillary or pectoral adenopathy.  Skin:    General: Skin is warm and dry.     Capillary Refill: Capillary refill takes less than 2 seconds.  Neurological:     General: No focal deficit present.     Mental Status: She is alert and oriented to person, place, and time.     Cranial Nerves: No cranial nerve deficit.     Sensory: No sensory deficit.  Psychiatric:        Mood and Affect: Mood normal.        Behavior: Behavior normal.        Thought Content: Thought content normal.        Judgment: Judgment normal.         Assessment And Plan:     Encounter for annual health examination Assessment & Plan: Behavior modifications discussed and diet history reviewed.   Pt will continue to exercise regularly and modify diet with low GI, plant based foods and decrease intake of processed foods.  Recommend intake of daily multivitamin, Vitamin D, and calcium.  Recommend mammogram and colonoscopy for preventive screenings, as well as recommend immunizations that include influenza, TDAP, and Shingles    Other insomnia Assessment & Plan: Continue Ambien , takes 1/2 tab nightly  Orders: -     CMP14+EGFR  Herpes zoster vaccination declined Assessment & Plan: Declines shingrix, educated on disease process and is aware if he changes his mind to notify office    COVID-19 vaccination declined Assessment & Plan: Declines covid 19 vaccine. Discussed risk of covid 76 and if she changes her mind about the vaccine to call the office. Education has  been provided regarding the importance of this vaccine but patient still declined. Advised may receive this vaccine at local pharmacy or Health Dept.or vaccine clinic. Aware to provide a copy of the vaccination record if obtained from local pharmacy or Health Dept.  Encouraged to take multivitamin, vitamin d, vitamin c and zinc to increase immune system. Aware can call office if would like to have vaccine here at office.  Verbalized acceptance and understanding.    Choroid melanoma of left eye [facility]) Assessment & Plan: Will refer to local oncologist to manage her melanoma due to seeing a provider in [facility]  Orders: -     Ambulatory referral to Hematology / Oncology  Aortic atherosclerosis [facility]) Assessment & Plan: She is currently not on a statin, discussed the risk of not taking a statin and the importance due to having plaque build up in her vessels   Allergy, initial encounter -     Albuterol  Sulfate HFA; Inhale 2 puffs into the lungs every 6 (six) hours as needed for wheezing or shortness of breath.  Dispense: 8 g; Refill: 2 -     EPINEPHrine ; Inject 0.3 mg into the muscle as needed for anaphylaxis.  Dispense: 1 each; Refill: 2  Age-related osteoporosis without current pathological fracture Assessment & Plan: This is a new finding and she is not interested in medication management. We could consider referring to sports medicine to provide options.   Malignant neoplasm of upper-outer quadrant of right breast in female, estrogen receptor negative (HCC) Assessment & Plan: She has continued to decline a mammogram.    Other long term (current) drug therapy -     CBC with Differential/Platelet  Pneumococcal vaccination declined   Return for 1 year physical, 85m insomnia. Patient was given opportunity to ask questions. Patient verbalized understanding of the plan and was able to repeat key elements of the plan. All questions were answered to their satisfaction.   Virginia Epley, FNP  I, Virginia Epley, FNP, have reviewed all documentation for this visit. The documentation on 07/08/23 for the exam, diagnosis, procedures, and orders are all accurate and complete.

## 2023-07-08 ENCOUNTER — Ambulatory Visit: Payer: PPO | Admitting: Nurse Practitioner

## 2023-07-08 ENCOUNTER — Encounter: Payer: Self-pay | Admitting: Nurse Practitioner

## 2023-07-08 VITALS — BP 100/60 | HR 78 | Temp 98.4°F | Ht 65.0 in | Wt 112.0 lb

## 2023-07-08 DIAGNOSIS — Z171 Estrogen receptor negative status [ER-]: Secondary | ICD-10-CM | POA: Diagnosis not present

## 2023-07-08 DIAGNOSIS — M810 Age-related osteoporosis without current pathological fracture: Secondary | ICD-10-CM

## 2023-07-08 DIAGNOSIS — T7840XA Allergy, unspecified, initial encounter: Secondary | ICD-10-CM | POA: Diagnosis not present

## 2023-07-08 DIAGNOSIS — I700 Atherosclerosis of aorta: Secondary | ICD-10-CM | POA: Diagnosis not present

## 2023-07-08 DIAGNOSIS — C6932 Malignant neoplasm of left choroid: Secondary | ICD-10-CM | POA: Diagnosis not present

## 2023-07-08 DIAGNOSIS — G4709 Other insomnia: Secondary | ICD-10-CM

## 2023-07-08 DIAGNOSIS — Z79899 Other long term (current) drug therapy: Secondary | ICD-10-CM

## 2023-07-08 DIAGNOSIS — C50411 Malignant neoplasm of upper-outer quadrant of right female breast: Secondary | ICD-10-CM | POA: Diagnosis not present

## 2023-07-08 DIAGNOSIS — Z0000 Encounter for general adult medical examination without abnormal findings: Secondary | ICD-10-CM

## 2023-07-08 DIAGNOSIS — Z2821 Immunization not carried out because of patient refusal: Secondary | ICD-10-CM

## 2023-07-08 HISTORY — DX: Encounter for general adult medical examination without abnormal findings: Z00.00

## 2023-07-08 HISTORY — DX: Age-related osteoporosis without current pathological fracture: M81.0

## 2023-07-08 MED ORDER — ALBUTEROL SULFATE HFA 108 (90 BASE) MCG/ACT IN AERS: 2.0000 | INHALATION_SPRAY | Freq: Four times a day (QID) | RESPIRATORY_TRACT | 2 refills | Status: AC | PRN

## 2023-07-08 MED ORDER — EPINEPHRINE 0.3 MG/0.3ML IJ SOAJ: 0.3000 mg | INTRAMUSCULAR | 2 refills | Status: AC | PRN

## 2023-07-08 NOTE — Telephone Encounter (Signed)
 Duplicate Encounter   Copied from CRM [id number]. Topic: Clinical - Prescription Issue >> Jul 08, 2023  5:35 PM Chuck Crater wrote: Reason for CRM: Patient states that Pharmacy still doesn't have prescription for zolpidem (AMBIEN) 5 MG tablet. She states that she will be out tonight. She seen provider today and was told that it would be sent to pharmacy. This encounter was created in error - please disregard.

## 2023-07-09 LAB — CMP14+EGFR
ALT: 17 IU/L (ref 0–32)
AST: 20 IU/L (ref 0–40)
Albumin: 4.4 g/dL (ref 3.8–4.8)
Alkaline Phosphatase: 59 IU/L (ref 44–121)
BUN/Creatinine Ratio: 10 — ABNORMAL LOW (ref 12–28)
BUN: 9 mg/dL (ref 8–27)
Bilirubin Total: 0.3 mg/dL (ref 0.0–1.2)
CO2: 21 mmol/L (ref 20–29)
Calcium: 9.7 mg/dL (ref 8.7–10.3)
Chloride: 102 mmol/L (ref 96–106)
Creatinine, Ser: 0.9 mg/dL (ref 0.57–1.00)
Globulin, Total: 3 g/dL (ref 1.5–4.5)
Glucose: 73 mg/dL (ref 70–99)
Potassium: 3.8 mmol/L (ref 3.5–5.2)
Sodium: 139 mmol/L (ref 134–144)
Total Protein: 7.4 g/dL (ref 6.0–8.5)
eGFR: 68 mL/min/{1.73_m2} (ref >59)

## 2023-07-09 LAB — CBC WITH DIFFERENTIAL/PLATELET
Basophils Absolute: 0.1 10*3/uL (ref 0.0–0.2)
Basos: 1 %
EOS (ABSOLUTE): 0.1 10*3/uL (ref 0.0–0.4)
Eos: 1 %
Hematocrit: 37.5 % (ref 34.0–46.6)
Hemoglobin: 11.9 g/dL (ref 11.1–15.9)
Immature Grans (Abs): 0 10*3/uL (ref 0.0–0.1)
Immature Granulocytes: 0 %
Lymphocytes Absolute: 3.2 10*3/uL — ABNORMAL HIGH (ref 0.7–3.1)
Lymphs: 48 %
MCH: 25.6 pg — ABNORMAL LOW (ref 26.6–33.0)
MCHC: 31.7 g/dL (ref 31.5–35.7)
MCV: 81 fL (ref 79–97)
Monocytes Absolute: 0.5 10*3/uL (ref 0.1–0.9)
Monocytes: 8 %
Neutrophils Absolute: 2.8 10*3/uL (ref 1.4–7.0)
Neutrophils: 42 %
Platelets: 338 10*3/uL (ref 150–450)
RBC: 4.65 x10E6/uL (ref 3.77–5.28)
RDW: 13 % (ref 11.7–15.4)
WBC: 6.6 10*3/uL (ref 3.4–10.8)

## 2023-07-12 ENCOUNTER — Other Ambulatory Visit: Payer: Self-pay | Admitting: Nurse Practitioner

## 2023-07-12 ENCOUNTER — Other Ambulatory Visit: Payer: Self-pay

## 2023-07-12 DIAGNOSIS — G4709 Other insomnia: Secondary | ICD-10-CM

## 2023-07-12 MED ORDER — ZOLPIDEM TARTRATE 5 MG PO TABS: ORAL_TABLET | ORAL | 5 refills | Status: DC

## 2023-07-12 NOTE — Telephone Encounter (Signed)
 Copied from CRM [id number]. Topic: Clinical - Prescription Issue >> Jul 12, 2023  4:34 PM Orien Bird wrote: Reason for CRM: Patient is wanting the zolpidem  (AMBIEN ) 5 MG tablet medication to go to the [facility] [id number] [place], [place] - [address] [place] [address] Phone: [phone] Fax: [phone]

## 2023-07-12 NOTE — Telephone Encounter (Signed)
 Copied from CRM [id number]. Topic: Clinical - Prescription Issue >> Jul 09, 2023  2:03 PM Zipporah Him wrote: Reason for CRM: Patient is still waiting on refill for zolpidem  (AMBIEN ) 5 MG tablet, pharmacy states that they have not received an prescription and she was thinking [facility] was going to send it over yesterday after she was seen in office. Please call if there are problems refilling >> Jul 12, 2023 12:54 PM Donald Frost wrote: The patient is frustrated as her refill for her zolpidem  (AMBIEN ) 5 MG tablet was called in to [facility] pharmacy and she hasn't used that pharmacy in years. She states she has put that multiple times she only uses  [facility] [id number] - [place], [place] - [address] Phone: [phone]  Fax: [phone]     Please re call that medicine refill into [facility] as soon as possible and let her know when that is done. I also removed [facility] from her file.

## 2023-07-14 ENCOUNTER — Other Ambulatory Visit: Payer: Self-pay | Admitting: Nurse Practitioner

## 2023-07-18 ENCOUNTER — Encounter: Payer: Self-pay | Admitting: Nurse Practitioner

## 2023-07-18 NOTE — Assessment & Plan Note (Signed)
 Declines shingrix, educated on disease process and is aware if he changes his mind to notify office

## 2023-07-18 NOTE — Assessment & Plan Note (Signed)

## 2023-07-18 NOTE — Assessment & Plan Note (Signed)

## 2023-07-18 NOTE — Assessment & Plan Note (Signed)
 She has continued to decline a mammogram.

## 2023-07-18 NOTE — Assessment & Plan Note (Signed)
Continue Ambien, takes 1/2 tab nightly

## 2023-07-18 NOTE — Assessment & Plan Note (Signed)
 Will refer to local oncologist to manage her melanoma due to seeing a provider in [facility]

## 2023-07-18 NOTE — Assessment & Plan Note (Signed)
 This is a new finding and she is not interested in medication management. We could consider referring to sports medicine to provide options.

## 2023-07-18 NOTE — Assessment & Plan Note (Signed)
She is currently not on a statin, discussed the risk of not taking a statin and the importance due to having plaque build up in her vessels

## 2023-07-31 NOTE — Assessment & Plan Note (Addendum)
 MRI abd w/wo contrast CT chest wo contrast CBC, CMP, LDH HLA-A*02:01 testing

## 2023-07-31 NOTE — Progress Notes (Unsigned)
 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  Per outside records with progressive enlarging uveal melanoma. Left eye periphery pigmented triangular shaped lesion, 13 x 13 x 2.72 mm thick report of Class 2 Prame negative. Will obtain [employer] report. S/p I 125 plaque radiotherapy OS 04/26/23. Discussed surveillance for recurrence. Higher risk for distant metastases especially in the liver.  Distant metastasis-free survival at five years based on class and PRAME expression reported 58% for Class 2 and PRAME negative patients. J Clin Oncol. 2024 Oct;42(28):3319-3329.  Report she had CT scan back in September reported fine.  Given her history of breast cancer, if any new imaging findings would still recommend biopsy.  Surveillance for Class 2 per NCCN guideline  Consider surveillance imaging every 3-6 months for 5 years, then every 6-12 months for years 6-10, then as clinically indicated Assessment & Plan Choroid melanoma of left eye (HCC) CT chest ab pelvis with contrast CBC, CMP, LDH HLA-A*02:01 testing Microcytosis MCV is low. No anemia. Will check Cbc and ferritin next time.  Continue routine medical care with PCP, including mammogram and colonoscopy or stool testing.  Orders Placed This Encounter  Procedures   CBC with Differential (Cancer Center Only)    Standing Status:   Future    Expiration Date:   08/01/2024   CMP (Cancer Center only)    Standing Status:   Future    Expiration Date:   08/01/2024   Lactate dehydrogenase    Standing Status:   Future    Expiration Date:   08/01/2024   Miscellaneous [facility]  test (send-out)    Standing Status:   Future    Expiration Date:   08/01/2024    Test name / description::   for Uveal melanoma   Ferritin    Standing Status:   Future    Expiration Date:   08/01/2024   The total time spent in the appointment was 60 minutes encounter with patients including review of chart and various tests results, discussions about plan of care and coordination of care plan   All questions were answered. The patient knows to call the clinic with any problems, questions or concerns. No barriers to learning was detected.  Lowanda Ruddy, MD 5/12/20251:44 PM  CHIEF COMPLAINTS/PURPOSE OF CONSULTATION:  Choroid melanoma of left eye   HISTORY OF PRESENTING ILLNESS:  Virginia [name] 73 y.o. female is here because of Choroid melanoma of left eye   Her main limitation is left sciatica and uses a cane but not much other limitation.   No chest pain, short of breath, coughing, stomach pain, nausea, vomiting, diarrhea. Appetite is good. No unexpected weight loss.  Report chemical like cleaning chemicals, perfumes, vapor exhausting, fragrances would make her feel bad.  I have reviewed her chart and materials related to her cancer extensively and collaborated history with the patient. Summary of oncologic history is as follows: Oncology History  Breast cancer of upper-outer quadrant of right female breast (HCC) (Resolved)  03/21/2015 Mammogram   Right breast mass. Irregularly shaped upper central posterior third measuring 1.1 x 1.1 x 1 cm   03/29/2015 Initial Diagnosis   Right breast biopsy: Invasive ductal carcinoma,  grade 1, ER 0%, PR 0%, HER-2 positive ratio 9.96, Ki-67 20%; T1cN0 stage IA clinical stage   04/24/2015 Surgery    Right lumpectomy: IDC grade 2, 1.3 cm, with DCIS high-grade, margins negative, DCIS margin 0.1-0.2 cm anterior and posterior margin focally , 0/3 lymph nodes negative, ER 0%, PR 0%, HER-2 positive ratio 9.96, Ki-67 20%, T1 cN0 stage IA   Choroid  melanoma of left eye (HCC)  01/07/2023 Initial Diagnosis   Choroid melanoma of left eye (HCC) US  measurements  Initial thickness was 1.51mm.  02/26/15 1.7 mm. 09/03/15 1.80mm 04/07/16 1.74 mm 10/06/16 1.74 mm 04/09/17 1.70 mm 04/05/18 1.70 mm 04/11/19 1.70 mm 04/10/20 1.6 mm  08/19/2021 1.6 mm  12/01/2022 2.4 mm 04/06/23 2.72 mm ---s/p Plaque---    04/06/2023 Imaging   B-Scan and Quantitative A-Scan Ultrasound, Posterior Segment, Left Eye   The lesion measures 2.72 mm in A-P dimensions with a transverse base of  14.74 mm and a . longitudinal base of 10.25 mm. Standardized diagnostic  A-scan measures 2.71 mm in A-P dimensions. The lesion exhibits low  internal reflectivity. There is evidence of internal vascularity. with  regular internal structure. There does not appear to be evidence of  extrascleral extension. There does not appear to be evidence of choroidal  excavation.  Impression: Findings consistent with choroidal melanoma, of left eye.  Continued interval changes of growth indicated.  Interpretation reviewed and agreed by attending physician.    04/26/2023 -  Radiation Therapy   I 125 plaque radiotherapy 04/26/23    07/08/2023 Tumor Marker   Alk phos 59. AST 20 ALT 17 Cr 0.9 WBC 6.6 Hgb 11.9 MCV 81 plt 338     MEDICAL HISTORY:  Past Medical History:  Diagnosis Date   Abdominal distension    Abdominal pain    Age-related osteoporosis without current pathological fracture 07/08/2023   Allergy 02/05/1984   Chemical allergies/severe   Anxiety 02/2018   Off and on   Arthritis    hands, feet   Back pain with radiation    Cancer (HCC)    right breast   Cataract 02/2015   Mild   Cough    Dysrhythmia    rare palpitation   Encounter for annual health examination 07/08/2023   GERD (gastroesophageal reflux disease)    Hypothyroidism    Low back pain with sciatica    Lymph nodes enlarged    Nasal congestion    Palpitations    Seizures (HCC)    phenergan caused it   Sore  throat     SURGICAL HISTORY: Past Surgical History:  Procedure Laterality Date   APPENDECTOMY  10/03/2011   BACK SURGERY     lumbar fusion   BREAST LUMPECTOMY WITH SENTINEL LYMPH NODE BIOPSY Right 04/24/2015   Procedure: RIGHT BREAST LUMPECTOMY WITH SENTINEL LYMPH NODE BIOPSY;  Surgeon: Lockie Rima, MD;  Location: [facility];  Service: General;  Laterality: Right;   BREAST SURGERY  Jan./2017   Lumpectomy, [place]   CESAREAN SECTION  09/09/1981   EYE SURGERY  04/2023   Brachytherapy/ [facility]- 5 days in hosp.   LAPAROSCOPIC APPENDECTOMY  10/03/2011   Procedure: APPENDECTOMY LAPAROSCOPIC;  Surgeon: Cloyce Darby, MD;  Location: [facility] OR;  Service: General;  Laterality: N/A;   SPINE SURGERY  Nov./2013   Lower spinal fusion   WISDOM TOOTH EXTRACTION  1973 - approximate    SOCIAL HISTORY: Social History   Socioeconomic History   Marital status: Married    Spouse  name: Not on file   Number of children: Not on file   Years of education: Not on file   Highest education level: Master's degree (e.g., MA, MS, MEng, MEd, MSW, MBA)  Occupational History   Occupation: retired  Tobacco Use   Smoking status: Never   Smokeless tobacco: Never  Vaping Use   Vaping status: Never Used  Substance and Sexual Activity   Alcohol use: Never   Drug use: Never   Sexual activity: Not Currently    Birth control/protection: Post-menopausal    Comment: Vaginal dryness  Other Topics Concern   Not on file  Social History Narrative   Not on file   Social Drivers of Health   Financial Resource Strain: Low Risk  (07/07/2023)   Overall Financial Resource Strain (CARDIA)    Difficulty of Paying Living Expenses: Not very hard  Food Insecurity: No Food Insecurity (07/07/2023)   Hunger Vital Sign    Worried About Running Out of Food in the Last Year: Never true    Ran Out of Food in the Last Year: Never true  Transportation Needs: No Transportation Needs (07/07/2023)   PRAPARE -  Administrator, Civil Service (Medical): No    Lack of Transportation (Non-Medical): No  Physical Activity: Insufficiently Active (07/07/2023)   Exercise Vital Sign    Days of Exercise per Week: 2 days    Minutes of Exercise per Session: 30 min  Stress: Stress Concern Present (07/07/2023)   [employer] of Occupational Health - Occupational Stress Questionnaire    Feeling of Stress : To some extent  Social Connections: Unknown (07/07/2023)   Social Connection and Isolation Panel [NHANES]    Frequency of Communication with Friends and Family: Once a week    Frequency of Social Gatherings with Friends and Family: Patient declined    Attends Religious Services: Never    Database administrator or Organizations: No    Attends Engineer, structural: Not on file    Marital Status: Married  Catering manager Violence: Not At Risk (01/07/2023)   Humiliation, Afraid, Rape, and Kick questionnaire    Fear of Current or Ex-Partner: No    Emotionally Abused: No    Physically Abused: No    Sexually Abused: No    FAMILY HISTORY: Family History  Problem Relation Age of Onset   Arthritis Mother    Vision loss Mother    Emphysema Father    COPD Father    Early death Father    Heart disease Father    Hypertension Father    Alcohol abuse Paternal Grandmother    Alcohol abuse Sister    Anxiety disorder Sister    Asthma Sister    COPD Sister    Early death Brother    Varicose Veins Maternal Aunt     ALLERGIES:  is allergic to antihistamines, diphenhydramine-type; ofirmev  [acetaminophen ]; phenergan [promethazine]; and scopolamine .  MEDICATIONS:  Current Outpatient Medications  Medication Sig Dispense Refill   albuterol  (VENTOLIN  HFA) 108 (90 Base) MCG/ACT inhaler Inhale 2 puffs into the lungs every 6 (six) hours as needed for wheezing or shortness of breath. 8 g 2   Ascorbic Acid  (VITAMIN C) POWD Take 2.5 g by mouth 2 (two) times daily. (Patient not taking: Reported on  07/08/2023)     B COMPLEX VITAMINS PO Take 1 tablet by mouth daily at 2 am.     Cholecalciferol (VITAMIN D PO) Take 4 drops by mouth daily. (Patient not taking: Reported on  07/08/2023)     EPINEPHrine  (EPIPEN  2-PAK) 0.3 mg/0.3 mL IJ SOAJ injection Inject 0.3 mg into the muscle as needed for anaphylaxis. 1 each 2   GOLDEN SEAL EXTRACT PO Take by mouth.     LYSINE PO Take 4 capsules by mouth daily.     Magnesium  Glycinate 665 MG CAPS Take 1 capsule by mouth at bedtime as needed. 30 capsule 1   MAGNESIUM  PO Take 1 tablet by mouth daily. (Patient not taking: Reported on 07/08/2023)     Melatonin 5 MG CAPS Take 2 capsules by mouth at bedtime.     Multiple Vitamins-Minerals (ZINC PO) Take by mouth. daily     OVER THE COUNTER MEDICATION Lemon balm extract     OVER THE COUNTER MEDICATION [place]  Poppy Extract     oxyCODONE -acetaminophen  (PERCOCET) 5-325 MG tablet Take 0.5-1 tablets by mouth every 6 (six) hours as needed for severe pain. (Patient not taking: Reported on 07/08/2023) 20 tablet 0   sodium chloride  (OCEAN) 0.65 % nasal spray Place 1 spray into the nose daily as needed. For congestion.     UNABLE TO FIND Argentyn 23     UNABLE TO FIND Med Name: iodine 4 drops daily     zolpidem  (AMBIEN ) 5 MG tablet TAKE 1/2 TABLET BY MOUTH DAILY 15 tablet 5   No current facility-administered medications for this visit.    REVIEW OF SYSTEMS:   All relevant systems were reviewed with the patient and are negative.  PHYSICAL EXAMINATION: ECOG PERFORMANCE STATUS: 1 - Symptomatic but completely ambulatory  Vitals:   08/02/23 1243  BP: 137/87  Pulse: 89  Resp: 17  Temp: 97.6 F (36.4 C)  SpO2: 99%   Filed Weights   08/02/23 1243  Weight: 112 lb 3.2 oz (50.9 kg)    GENERAL: alert, no distress and comfortable, using a cane SKIN: skin color is normal, no jaundice, rashes on exposed skin EYES: sclera clear OROPHARYNX: no exudate, no erythema NECK: supple LYMPH:  no palpable lymphadenopathy in the  cervical, axillary regions LUNGS: Effort normal, no respiratory distress.  Clear to auscultation bilaterally HEART: regular rate & rhythm and no lower extremity edema ABDOMEN: soft, non-tender and nondistended Musculoskeletal: no edema  LABORATORY DATA:  I have reviewed the data as listed Lab Results  Component Value Date   WBC 6.6 07/08/2023   HGB 11.9 07/08/2023   HCT 37.5 07/08/2023   MCV 81 07/08/2023   PLT 338 07/08/2023   Recent Labs    12/07/22 1410 01/07/23 1520 07/08/23 1519  NA  --  134 139  K  --  4.8 3.8  CL  --  98 102  CO2  --  25 21  GLUCOSE  --  70 73  BUN  --  4* 9  CREATININE  --  0.54* 0.90  CALCIUM  --  9.6 9.7  PROT 6.6 6.5 7.4  ALBUMIN 4.3 4.2 4.4  AST 26 26 20   ALT 18 18 17   ALKPHOS 152* 156* 59  BILITOT 0.4 0.3 0.3  BILIDIR 0.14  --   --     RADIOGRAPHIC STUDIES: I have reviewed the radiological images as listed and agreed with the findings in the report.  New labs and imaging ordered.

## 2023-08-02 ENCOUNTER — Inpatient Hospital Stay

## 2023-08-02 VITALS — BP 137/87 | HR 89 | Temp 97.6°F | Resp 17 | Wt 112.2 lb

## 2023-08-02 DIAGNOSIS — C6932 Malignant neoplasm of left choroid: Secondary | ICD-10-CM | POA: Diagnosis not present

## 2023-08-02 DIAGNOSIS — Z923 Personal history of irradiation: Secondary | ICD-10-CM | POA: Insufficient documentation

## 2023-08-02 DIAGNOSIS — E039 Hypothyroidism, unspecified: Secondary | ICD-10-CM | POA: Insufficient documentation

## 2023-08-02 DIAGNOSIS — R718 Other abnormality of red blood cells: Secondary | ICD-10-CM | POA: Diagnosis not present

## 2023-08-02 DIAGNOSIS — Z853 Personal history of malignant neoplasm of breast: Secondary | ICD-10-CM | POA: Diagnosis not present

## 2023-08-05 ENCOUNTER — Encounter: Payer: Self-pay | Admitting: Ophthalmology

## 2023-08-17 DIAGNOSIS — H2513 Age-related nuclear cataract, bilateral: Secondary | ICD-10-CM | POA: Diagnosis not present

## 2023-08-17 DIAGNOSIS — H35373 Puckering of macula, bilateral: Secondary | ICD-10-CM | POA: Diagnosis not present

## 2023-08-17 DIAGNOSIS — C6932 Malignant neoplasm of left choroid: Secondary | ICD-10-CM | POA: Diagnosis not present

## 2023-09-01 ENCOUNTER — Other Ambulatory Visit: Payer: Self-pay | Admitting: *Deleted

## 2023-09-01 DIAGNOSIS — C6932 Malignant neoplasm of left choroid: Secondary | ICD-10-CM

## 2023-10-01 ENCOUNTER — Ambulatory Visit (HOSPITAL_COMMUNITY)
Admission: RE | Admit: 2023-10-01 | Discharge: 2023-10-01 | Disposition: A | Discharge status: Home / Self Care | Source: Ambulatory Visit

## 2023-10-01 DIAGNOSIS — K7689 Other specified diseases of liver: Secondary | ICD-10-CM | POA: Diagnosis not present

## 2023-10-01 DIAGNOSIS — C6932 Malignant neoplasm of left choroid: Secondary | ICD-10-CM | POA: Insufficient documentation

## 2023-10-01 DIAGNOSIS — I700 Atherosclerosis of aorta: Secondary | ICD-10-CM | POA: Diagnosis not present

## 2023-10-01 MED ORDER — IOHEXOL 9 MG/ML PO SOLN
500.0000 mL | ORAL | Status: AC
  Administered 2023-10-01: 1000 mL via ORAL

## 2023-10-01 MED ORDER — IOHEXOL 9 MG/ML PO SOLN
ORAL | Status: AC
  Filled 2023-10-01: qty 1000

## 2023-10-01 MED ORDER — IOHEXOL 300 MG/ML  SOLN
100.0000 mL | Freq: Once | INTRAMUSCULAR | Status: AC | PRN
  Administered 2023-10-01: 100 mL via INTRAVENOUS

## 2023-10-08 ENCOUNTER — Ambulatory Visit: Payer: Self-pay

## 2023-10-11 NOTE — Telephone Encounter (Signed)
 Spoke with pt and advised of recommendations from the radiologist.  Pt is checking to see if there is metal in her back from a previous surgery and she will let us  know as soon as she hears back from him so that we will know whether to proceed with the MRI or an US 

## 2023-10-21 ENCOUNTER — Other Ambulatory Visit: Payer: Self-pay

## 2023-10-21 DIAGNOSIS — K769 Liver disease, unspecified: Secondary | ICD-10-CM

## 2023-10-21 DIAGNOSIS — R1900 Intra-abdominal and pelvic swelling, mass and lump, unspecified site: Secondary | ICD-10-CM

## 2023-10-21 DIAGNOSIS — C6932 Malignant neoplasm of left choroid: Secondary | ICD-10-CM

## 2023-10-21 NOTE — Progress Notes (Signed)
 Patient called back and report titanium in the back compatible with MRI. MRI with and without contrast liver protocol ordered to rule out metastases Pelvic ultrasound ordered for pelvic findings: Left adnexal cyst measures 3.9 cm with a questionable 10 mm nodular focus previously measuring 3.7 cm. Suggest more definitive characterization by pelvic ultrasound.

## 2023-10-25 ENCOUNTER — Ambulatory Visit (HOSPITAL_COMMUNITY)
Admission: RE | Admit: 2023-10-25 | Discharge: 2023-10-25 | Disposition: A | Discharge status: Home / Self Care | Source: Ambulatory Visit

## 2023-10-25 DIAGNOSIS — N83202 Unspecified ovarian cyst, left side: Secondary | ICD-10-CM | POA: Diagnosis not present

## 2023-10-25 DIAGNOSIS — R1900 Intra-abdominal and pelvic swelling, mass and lump, unspecified site: Secondary | ICD-10-CM

## 2023-10-25 DIAGNOSIS — C6932 Malignant neoplasm of left choroid: Secondary | ICD-10-CM | POA: Insufficient documentation

## 2023-10-25 DIAGNOSIS — K769 Liver disease, unspecified: Secondary | ICD-10-CM | POA: Diagnosis not present

## 2023-10-25 DIAGNOSIS — N83201 Unspecified ovarian cyst, right side: Secondary | ICD-10-CM | POA: Diagnosis not present

## 2023-10-25 MED ORDER — GADOBUTROL 1 MMOL/ML IV SOLN
5.0000 mL | Freq: Once | INTRAVENOUS | Status: AC | PRN
  Administered 2023-10-25: 5 mL via INTRAVENOUS

## 2023-11-01 ENCOUNTER — Ambulatory Visit: Payer: Self-pay

## 2023-11-01 NOTE — Progress Notes (Signed)
 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   Per outside records with progressive enlarging uveal melanoma. Left eye periphery pigmented triangular shaped lesion, 13 x 13 x 2.72 mm thick report of Class 2 Prame negative. Will obtain [employer] report. S/p I 125 plaque radiotherapy OS 04/26/23. Discussed surveillance for recurrence. Higher risk for distant metastases especially in the liver.  Distant metastasis-free survival at five years based on class and PRAME expression reported 58% for Class 2 and PRAME negative patients. J Clin Oncol. 2024 Oct;42(28):3319-3329.  CT question liver lesion.  MRI showed no evidence of liver metastases.  Reported IPMN. Bilateral ovarian cysts with internal septations measuring approximately 4.5 cm on the left and 2.9 cm on the right, these are partially visualized and incompletely evaluated. Discussed with patient on the results. Will refer to gyn onc for evaluation. Assessment & Plan Choroid melanoma of left eye (HCC) CT chest in Dec MRI abdomen in Dec CBC, CMP, LDH with next visit. HLA-A*02:01 testing sent today. Adnexal cyst Refer to gyn onc for evaluation  Orders Placed This Encounter  Procedures   MR Abdomen W Wo Contrast    Standing Status:   Future    Expected Date:   02/21/2024    Expiration Date:   11/03/2024    If indicated for the ordered procedure, I authorize the administration of contrast media per Radiology protocol:   Yes    What is the patient's sedation requirement?:   No Sedation    Does the patient have a pacemaker or implanted devices?:   No    Preferred imaging location?:    [facility] (table limit - 500lbs)   CT Chest Wo Contrast    Standing Status:   Future    Expected Date:   02/21/2024    Expiration Date:   11/03/2024    Preferred imaging location?:   [facility]   CBC with Differential (Cancer Center Only)    Standing Status:   Future    Expiration Date:   11/03/2024   CMP (Cancer Center only)    Standing Status:   Future    Expiration Date:   11/03/2024   Lactate dehydrogenase    Standing Status:   Future    Expiration Date:   11/03/2024     Virginia JAYSON Chihuahua, MD  INTERVAL HISTORY: Patient returns for follow-up. Overall feeling well. No stomach pain, pelvic pain, short of breath, coughing, vaginal bleeding.  Oncology History  Breast cancer of upper-outer quadrant of right female breast (HCC) (Resolved)  03/21/2015 Mammogram   Right breast mass. Irregularly shaped upper central posterior third measuring 1.1 x 1.1 x 1 cm   03/29/2015 Initial Diagnosis   Right breast biopsy: Invasive ductal carcinoma, grade 1, ER 0%, PR 0%, HER-2 positive ratio 9.96, Ki-67 20%; T1cN0 stage IA clinical stage   04/24/2015 Surgery    Right lumpectomy: IDC grade 2, 1.3 cm, with DCIS high-grade, margins negative, DCIS margin 0.1-0.2 cm anterior and posterior margin focally , 0/3 lymph nodes negative, ER 0%, PR 0%, HER-2 positive ratio 9.96, Ki-67 20%, T1 cN0 stage IA   Choroid melanoma of left eye (HCC)  01/07/2023 Initial Diagnosis  Choroid melanoma of left eye (HCC) US  measurements  Initial thickness was 1.50mm.  02/26/15 1.7 mm. 09/03/15 1.33mm 04/07/16 1.74 mm 10/06/16 1.74 mm 04/09/17 1.70 mm 04/05/18 1.70 mm 04/11/19 1.70 mm 04/10/20 1.6 mm  08/19/2021 1.6 mm  12/01/2022 2.4 mm 04/06/23 2.72 mm ---s/p Plaque---    04/06/2023 Imaging   B-Scan and Quantitative A-Scan Ultrasound, Posterior Segment, Left Eye   The lesion measures 2.72 mm in A-P dimensions with a transverse base of  14.74 mm and a . longitudinal base of 10.25 mm. Standardized diagnostic   A-scan measures 2.71 mm in A-P dimensions. The lesion exhibits low  internal reflectivity. There is evidence of internal vascularity. with  regular internal structure. There does not appear to be evidence of  extrascleral extension. There does not appear to be evidence of choroidal  excavation.  Impression: Findings consistent with choroidal melanoma, of left eye.  Continued interval changes of growth indicated.  Interpretation reviewed and agreed by attending physician.    04/26/2023 -  Radiation Therapy   I 125 plaque radiotherapy 04/26/23    07/08/2023 Tumor Marker   Alk phos 59. AST 20 ALT 17 Cr 0.9 WBC 6.6 Hgb 11.9 MCV 81 plt 338     PHYSICAL EXAMINATION: ECOG PERFORMANCE STATUS: 1 - Symptomatic but completely ambulatory  Vitals:   11/04/23 1532  BP: 127/85  Pulse: 69  Resp: 18  Temp: (!) 97.3 F (36.3 C)  SpO2: 99%   Filed Weights   11/04/23 1532  Weight: 112 lb 3.2 oz (50.9 kg)    GENERAL: alert, no distress and comfortable SKIN: skin color normal and no jaundice EYES: sclera clear LUNGS: clear to auscultation and percussion with normal breathing effort HEART: regular rate & rhythm  ABDOMEN: abdomen soft, non-tender and nondistended.  Relevant data reviewed during this visit included labs and imaging.

## 2023-11-01 NOTE — Assessment & Plan Note (Addendum)
 CT chest in Dec MRI abdomen in Dec CBC, CMP, LDH with next visit. HLA-A*02:01 testing sent today.

## 2023-11-03 DIAGNOSIS — H2513 Age-related nuclear cataract, bilateral: Secondary | ICD-10-CM | POA: Diagnosis not present

## 2023-11-03 DIAGNOSIS — C6932 Malignant neoplasm of left choroid: Secondary | ICD-10-CM | POA: Diagnosis not present

## 2023-11-03 DIAGNOSIS — H35373 Puckering of macula, bilateral: Secondary | ICD-10-CM | POA: Diagnosis not present

## 2023-11-04 ENCOUNTER — Other Ambulatory Visit: Payer: Self-pay | Admitting: *Deleted

## 2023-11-04 ENCOUNTER — Inpatient Hospital Stay (HOSPITAL_BASED_OUTPATIENT_CLINIC_OR_DEPARTMENT_OTHER)

## 2023-11-04 ENCOUNTER — Inpatient Hospital Stay

## 2023-11-04 VITALS — BP 127/85 | HR 69 | Temp 97.3°F | Resp 18 | Wt 112.2 lb

## 2023-11-04 DIAGNOSIS — N949 Unspecified condition associated with female genital organs and menstrual cycle: Secondary | ICD-10-CM

## 2023-11-04 DIAGNOSIS — C6932 Malignant neoplasm of left choroid: Secondary | ICD-10-CM

## 2023-11-04 DIAGNOSIS — D3912 Neoplasm of uncertain behavior of left ovary: Secondary | ICD-10-CM | POA: Insufficient documentation

## 2023-11-04 DIAGNOSIS — R718 Other abnormality of red blood cells: Secondary | ICD-10-CM

## 2023-11-04 DIAGNOSIS — Z923 Personal history of irradiation: Secondary | ICD-10-CM | POA: Insufficient documentation

## 2023-11-04 DIAGNOSIS — R1900 Intra-abdominal and pelvic swelling, mass and lump, unspecified site: Secondary | ICD-10-CM

## 2023-11-04 DIAGNOSIS — Z853 Personal history of malignant neoplasm of breast: Secondary | ICD-10-CM | POA: Diagnosis not present

## 2023-11-04 LAB — CMP (CANCER CENTER ONLY)
ALT: 21 U/L (ref 0–44)
AST: 26 U/L (ref 15–41)
Albumin: 4.2 g/dL (ref 3.5–5.0)
Alkaline Phosphatase: 138 U/L — ABNORMAL HIGH (ref 38–126)
Anion gap: 5 (ref 5–15)
BUN: <5 mg/dL — ABNORMAL LOW (ref 8–23)
CO2: 25 mmol/L (ref 22–32)
Calcium: 9.3 mg/dL (ref 8.9–10.3)
Chloride: 101 mmol/L (ref 98–111)
Creatinine: 0.47 mg/dL (ref 0.44–1.00)
GFR, Estimated: >60 mL/min (ref >60)
Glucose, Bld: 79 mg/dL (ref 70–99)
Potassium: 5.2 mmol/L — ABNORMAL HIGH (ref 3.5–5.1)
Sodium: 131 mmol/L — ABNORMAL LOW (ref 135–145)
Total Bilirubin: 0.4 mg/dL (ref 0.0–1.2)
Total Protein: 6.5 g/dL (ref 6.5–8.1)

## 2023-11-04 LAB — CBC WITH DIFFERENTIAL (CANCER CENTER ONLY)
Abs Immature Granulocytes: 0.01 K/uL (ref 0.00–0.07)
Basophils Absolute: 0.1 K/uL (ref 0.0–0.1)
Basophils Relative: 1 %
Eosinophils Absolute: 0.1 K/uL (ref 0.0–0.5)
Eosinophils Relative: 2 %
HCT: 38.5 % (ref 36.0–46.0)
Hemoglobin: 13.1 g/dL (ref 12.0–15.0)
Immature Granulocytes: 0 %
Lymphocytes Relative: 24 %
Lymphs Abs: 1.1 K/uL (ref 0.7–4.0)
MCH: 31.3 pg (ref 26.0–34.0)
MCHC: 34 g/dL (ref 30.0–36.0)
MCV: 91.9 fL (ref 80.0–100.0)
Monocytes Absolute: 0.5 K/uL (ref 0.1–1.0)
Monocytes Relative: 11 %
Neutro Abs: 2.8 K/uL (ref 1.7–7.7)
Neutrophils Relative %: 62 %
Platelet Count: 282 K/uL (ref 150–400)
RBC: 4.19 MIL/uL (ref 3.87–5.11)
RDW: 12.4 % (ref 11.5–15.5)
WBC Count: 4.5 K/uL (ref 4.0–10.5)
nRBC: 0 % (ref 0.0–0.2)

## 2023-11-04 LAB — LACTATE DEHYDROGENASE: LDH: 148 U/L (ref 98–192)

## 2023-11-04 NOTE — Assessment & Plan Note (Addendum)
 Refer to gyn onc for evaluation

## 2023-11-05 LAB — FERRITIN: Ferritin: 53 ng/mL (ref 11–307)

## 2023-11-05 LAB — CA 125: Cancer Antigen (CA) 125: 10.9 U/mL (ref 0.0–38.1)

## 2023-11-08 ENCOUNTER — Ambulatory Visit: Payer: Self-pay

## 2023-11-08 DIAGNOSIS — R935 Abnormal findings on diagnostic imaging of other abdominal regions, including retroperitoneum: Secondary | ICD-10-CM

## 2023-11-09 ENCOUNTER — Telehealth: Payer: Self-pay | Admitting: *Deleted

## 2023-11-09 NOTE — Telephone Encounter (Signed)
-----   Message from [name] sent at 11/08/2023 12:26 PM EDT ----- Manuelita thanks Eleanor Norway would you let patient know cannot recommend getting MRI first?  Therefore I am ordering and she should get a call.  Thank you. ----- Message ----- From: Micheline Eleanor BIRCH, NP Sent: 11/05/2023   3:48 PM EDT To: [name], MD  Hello Dr. Chihuahua,     Dr. Viktoria reviewed the referral for this patient and would like for her to have an MRI of the pelvis (with and without) if possible. We will watch for the appt and then schedule her shortly after.   Thank you so much.  Eleanor Micheline NP GYN Oncology

## 2023-11-09 NOTE — Telephone Encounter (Signed)
 PC to patient, no answer, left VM - informed patient Dr Viktoria would like for her to have a MRI of her pelvis before her initial visit.  Dr Tina has ordered the MRI, Central Scheduling will be calling to schedule & then Dr Lewie office will schedule her to come in once the MRI is done.  Central Scheduling number given if patient wants to schedule the MRI herself.  Number to this office given if she has any questions/concerns.

## 2023-11-11 ENCOUNTER — Other Ambulatory Visit: Payer: Self-pay

## 2023-11-11 ENCOUNTER — Telehealth: Payer: Self-pay | Admitting: *Deleted

## 2023-11-11 DIAGNOSIS — C6932 Malignant neoplasm of left choroid: Secondary | ICD-10-CM

## 2023-11-11 NOTE — Telephone Encounter (Signed)
 Spoke with Virginia [name] regarding her referral to GYN oncology. She has an appointment scheduled with Dr. Viktoria on Thursday, September 11 th  at 11:15. Patient agrees to date and time. She has been provided with office address and location. She is also aware of our mask and visitor policy. Patient verbalized understanding and will call with any questions.

## 2023-11-11 NOTE — Progress Notes (Signed)
 Last [facility] order did not work. Ordered Test Number [id number] HLA A A*02:01-Specific.

## 2023-11-11 NOTE — Telephone Encounter (Signed)
 LMOM for the patient to call the office back to schedule a new patient appt. Patient to be scheduled on either 9/11 or 9/12

## 2023-11-12 ENCOUNTER — Other Ambulatory Visit: Payer: Self-pay

## 2023-11-12 DIAGNOSIS — C6932 Malignant neoplasm of left choroid: Secondary | ICD-10-CM

## 2023-11-24 ENCOUNTER — Ambulatory Visit (HOSPITAL_COMMUNITY)
Admission: RE | Admit: 2023-11-24 | Discharge: 2023-11-24 | Disposition: A | Discharge status: Home / Self Care | Source: Ambulatory Visit

## 2023-11-24 DIAGNOSIS — R9389 Abnormal findings on diagnostic imaging of other specified body structures: Secondary | ICD-10-CM | POA: Diagnosis not present

## 2023-11-24 DIAGNOSIS — N854 Malposition of uterus: Secondary | ICD-10-CM | POA: Diagnosis not present

## 2023-11-24 DIAGNOSIS — R935 Abnormal findings on diagnostic imaging of other abdominal regions, including retroperitoneum: Secondary | ICD-10-CM | POA: Insufficient documentation

## 2023-11-24 MED ORDER — GADOBUTROL 1 MMOL/ML IV SOLN
5.0000 mL | Freq: Once | INTRAVENOUS | Status: AC | PRN
  Administered 2023-11-24: 5 mL via INTRAVENOUS

## 2023-11-30 ENCOUNTER — Encounter: Payer: Self-pay | Admitting: Gynecologic Oncology

## 2023-12-01 NOTE — Progress Notes (Unsigned)
 GYNECOLOGIC ONCOLOGY NEW PATIENT CONSULTATION   Patient Name: Virginia [name]  Patient Age: 73 y.o. Date of Service: 12/02/23 Referring Provider: Pauletta Chihuahua, MD  Primary Care Provider: Georgina Speaks, FNP Consulting Provider: Comer Dollar, MD   Assessment/Plan:  Postmenopausal patient with history of breast cancer and melanoma with slowly enlarging mostly cystic bilateral adnexal masses.  We reviewed imaging findings over the last year and looked at CT images together today.  Overall, bilateral adnexal masses are stable to slightly increased over approximately 1 year on various imaging studies.  I discussed size of the masses on recent MRI and the area of focal nodular enhancement.  Overall, my suspicion is that these represent benign masses but I stressed that imaging is not diagnostic and that there is at least one feature (nodular enhancement) that raises the concern for the possibility of a borderline or malignant process.  We discussed recent tumor marker.  While her CA125 is normal, I stressed that this is not diagnostic.  CA125 can be elevated in many noncancerous disease processes and can be normal and up to 50% of early stage ovarian cancer.  I recommended that we get CEA and CA 19-9 as well.  Her preference was to wait to do this testing until she sees her primary care provider next with scheduled blood draw.  She is concerned about symptoms she is having and getting sick if she has blood drawn today.  Luckily, the patient is essentially asymptomatic.  With regard to management options, we discussed option of proceeding with diagnostic surgery with laparoscopic bilateral salpingo-oophorectomy.  The patient voiced significant hesitation regarding major surgery.  I think it would be reasonable to continue with close surveillance of these masses using imaging.  If there were to be continued or more significant growth in size or change to the appearance of either both adnexal masses,  this would lead to a stronger recommendation for diagnostic surgery.  The patient strongly wishes to avoid surgery and is amenable to follow-up imaging with a pelvic ultrasound 3-4 months after her recent MRI.  Reviewed trying to avoid gas producing foods for several days before her ultrasound and difficulty seeing her ovaries on last ultrasound due to bowel gas.  In the setting of a personal history of breast cancer and melanoma, I discussed with patient recommendation to meet with our genetic counselors for consideration of genetic testing.  If she had a genetic mutation that increased her risk of ovarian cancer, this may push us  to reconsider surgery sooner for risk reducing purposes.  Patient was amenable to referral being placed today.  The patient voiced having some discomfort on pelvic exam today related to vaginal atrophy.  We discussed which she has previously used including hormonal creams and currently suppositories that have coconut oil.  Discussed other natural lubricants.  A copy of this note was sent to the patient's referring provider.   65 minutes of total time was spent for this patient encounter, including preparation, face-to-face counseling with the patient and coordination of care, and documentation of the encounter.  Virginia Dollar, MD  Division of Gynecologic Oncology  Department of Obstetrics and Gynecology  [facility]  ___________________________________________  Chief Complaint: Chief Complaint  Patient presents with   Adnexal cyst    History of Present Illness:  Virginia [name] is a 73 y.o. y.o. female who is seen in consultation at the request of Dr. Chihuahua for an evaluation of bilateral adnexal masses.  Patient's history is for  ER/PR negative invasive ductal carcinoma of the right breast, stage I, diagnosed in 2017.  This was treated with lumpectomy.  She was subsequently diagnosed with melanoma of the left eye in 2024.  This was  treated with radioactive plaque therapy at [facility] in February 2025.  CT staging for melanoma in 11/2022 revealed incidental finding of bilateral adnexal cyst measuring up to 3.7 cm on the left and 2.9 cm on the right.  This was evaluated shortly after with transabdominal ultrasound exam showing septated cystic changes of the left ovary measuring up to 3.9 cm and similar findings of the right ovary measuring up to 3.9 cm.  CT of the chest, abdomen, pelvis on 10/01/2023 bleeding revealed minimal change in now 3.9 cm left adnexal cyst with questionable 10 mm nodular focus.  Stable small left adrenal nodule, indeterminate but favored to be benign.  Bilobular subcapsular nodular foci of enhancement of the liver are favored to be perfusional anomalies, nonspecific. Pelvic ultrasound on 8/4 unable to visualize either ovary due to overlying bowel gas.  MRI of the abdomen and pelvis on 9/3 revealed complex multiseptated bilateral ovarian cyst measuring up to 3 cm on the right and 4.4 on the left, with nodular foci of enhancement.  Fluid in the endocervical canal measures 4 mm.  Cystic lesion in the body of the pancreas measures 6 mm and does not demonstrate suspicious MRI features.  Colonic stool burden compatible with constipation.  CA-125 in mid August was 10.9.  The patient presents today by herself.  She notes overall doing well.  Endorses some fatigue.  Has occasional and very brief low pelvic pain for the last 3 years that resolves quickly and does not require the use of any medication for treatment.  She endorses normal bowel bladder function.  Reports good appetite without nausea or emesis.  Denies any recent weight changes.  PAST MEDICAL HISTORY:  Past Medical History:  Diagnosis Date   Abdominal distension    Abdominal pain    Age-related osteoporosis without current pathological fracture 07/08/2023   Allergy 02/05/1984   Chemical allergies/severe   Anxiety 02/2018   Off and on   Arthritis     hands, feet   Back pain with radiation    Cancer (HCC)    right breast   Cataract 02/2015   Mild   Cough    Dysrhythmia    rare palpitation   Encounter for annual health examination 07/08/2023   Hypothyroidism    no currently on medication   Low back pain with sciatica    Lymph nodes enlarged    Nasal congestion    Palpitations    Seizures (HCC)    30+ years ago, medication related   Sore throat      PAST SURGICAL HISTORY:  Past Surgical History:  Procedure Laterality Date   APPENDECTOMY  10/03/2011   BACK SURGERY     lumbar fusion   BREAST LUMPECTOMY WITH SENTINEL LYMPH NODE BIOPSY Right 04/24/2015   Procedure: RIGHT BREAST LUMPECTOMY WITH SENTINEL LYMPH NODE BIOPSY;  Surgeon: Jina Nephew, MD;  Location: [facility];  Service: General;  Laterality: Right;   BREAST SURGERY  Jan./2017   Lumpectomy, [place]   CESAREAN SECTION  09/09/1981   EYE SURGERY  04/2023   Brachytherapy/ [facility]- 5 days in hosp.   LAPAROSCOPIC APPENDECTOMY  10/03/2011   Procedure: APPENDECTOMY LAPAROSCOPIC;  Surgeon: Dann FORBES Hummer, MD;  Location: [facility] OR;  Service: General;  Laterality: N/A;   SPINE SURGERY  Nov./2013   Lower spinal fusion   WISDOM TOOTH EXTRACTION  1973 - approximate    OB/GYN HISTORY:  OB History  Gravida Para Term Preterm AB Living  2 2    2   SAB IAB Ectopic Multiple Live Births          # Outcome Date GA Lbr Len/2nd Weight Sex Type Anes PTL Lv  2 Para           1 Para             No LMP recorded. Patient is postmenopausal.  Age at menarche: 32  Age at menopause: 73 Hx of HRT: yes, previously used compounded vaginal cream (contained estrogen and testosterone ) Hx of STDs: denies Last pap: 2016 History of abnormal pap smears: denies  SCREENING STUDIES:  Last mammogram: 2016  Last colonoscopy: per patient > 10 years, + cologuard in 2022 Last bone mineral density: 2022  MEDICATIONS: Outpatient Encounter Medications as of 12/02/2023  Medication  Sig   albuterol  (VENTOLIN  HFA) 108 (90 Base) MCG/ACT inhaler Inhale 2 puffs into the lungs every 6 (six) hours as needed for wheezing or shortness of breath.   Ascorbic Acid  (VITAMIN C) POWD Take 2.5 g by mouth 2 (two) times daily.   B COMPLEX VITAMINS PO Take 1 tablet by mouth daily at 2 am.   Cholecalciferol (VITAMIN D PO) Take 4 drops by mouth daily.   EPINEPHrine  (EPIPEN  2-PAK) 0.3 mg/0.3 mL IJ SOAJ injection Inject 0.3 mg into the muscle as needed for anaphylaxis.   GOLDEN SEAL EXTRACT PO Take by mouth.   LYSINE PO Take 4 capsules by mouth daily.   Magnesium  Glycinate 665 MG CAPS Take 1 capsule by mouth at bedtime as needed.   Melatonin 5 MG CAPS Take 2 capsules by mouth at bedtime.   Multiple Vitamins-Minerals (ZINC PO) Take by mouth. daily   OVER THE COUNTER MEDICATION Lemon balm extract   OVER THE COUNTER MEDICATION [place]  Poppy Extract   oxyCODONE -acetaminophen  (PERCOCET) 5-325 MG tablet Take 0.5-1 tablets by mouth every 6 (six) hours as needed for severe pain.   sodium chloride  (OCEAN) 0.65 % nasal spray Place 1 spray into the nose daily as needed. For congestion.   UNABLE TO FIND Argentyn 23   UNABLE TO FIND Med Name: iodine 4 drops daily   zolpidem  (AMBIEN ) 5 MG tablet TAKE 1/2 TABLET BY MOUTH DAILY   [DISCONTINUED] MAGNESIUM  PO Take 1 tablet by mouth daily. (Patient not taking: Reported on 07/08/2023)   No facility-administered encounter medications on file as of 12/02/2023.    ALLERGIES:  Allergies  Allergen Reactions   Antihistamines, Diphenhydramine-Type Other (See Comments)    Dehydration and fatigue    Ofirmev  [Acetaminophen ] Other (See Comments)    Drop in BP   Phenergan [Promethazine] Other (See Comments)    Thinks it was a seizure   Scopolamine  Nausea And Vomiting     FAMILY HISTORY:  Family History  Problem Relation Age of Onset   Arthritis Mother    Vision loss Mother    Emphysema Father    COPD Father    Early death Father    Heart disease Father     Hypertension Father    Alcohol abuse Sister    Anxiety disorder Sister    Asthma Sister    COPD Sister    Early death Brother    Cancer Brother        diagnosed in 55s (found in brain and bones)   Alcohol abuse  Paternal Grandmother    Varicose Veins Maternal Aunt      SOCIAL HISTORY:  Social Connections: Unknown (07/07/2023)   Social Connection and Isolation Panel    Frequency of Communication with Friends and Family: Once a week    Frequency of Social Gatherings with Friends and Family: Patient declined    Attends Religious Services: Never    Database administrator or Organizations: No    Attends Engineer, structural: Not on file    Marital Status: Married    REVIEW OF SYSTEMS:  +ringing in ears occasionally, pain with intercourse Denies appetite changes, fevers, chills, fatigue, unexplained weight changes. Denies hearing loss, neck lumps or masses, mouth sores or voice changes. Denies cough or wheezing.  Denies shortness of breath. Denies chest pain or palpitations. Denies leg swelling. Denies abdominal distention, pain, blood in stools, constipation, diarrhea, nausea, vomiting, or early satiety. Denies dysuria, frequency, hematuria or incontinence. Denies hot flashes, pelvic pain, vaginal bleeding or vaginal discharge.   Denies joint pain, back pain or muscle pain/cramps. Denies itching, rash, or wounds. Denies dizziness, headaches, numbness or seizures. Denies swollen lymph nodes or glands, denies easy bruising or bleeding. Denies anxiety, depression, confusion, or decreased concentration.  Physical Exam:  Vital Signs for this encounter:  Blood pressure 128/64, pulse 73, temperature 98.6 F (37 C), temperature source Oral, resp. rate 19, height 5' 5 (1.651 m), weight 114 lb 3.2 oz (51.8 kg), SpO2 100%. Body mass index is 19 kg/m. General: Alert, oriented, no acute distress.  HEENT: Normocephalic, atraumatic. Sclera anicteric.  Chest: Clear to auscultation  bilaterally. No wheezes, rhonchi, or rales. Cardiovascular: Regular rate and rhythm, no murmurs, rubs, or gallops.  Abdomen: Normoactive bowel sounds. Soft, nondistended, nontender to palpation. No masses or hepatosplenomegaly appreciated. No palpable fluid wave.  Extremities: Grossly normal range of motion. Warm, well perfused. No edema bilaterally.  Skin: No rashes or lesions.  Lymphatics: No cervical, supraclavicular, or inguinal adenopathy.  GU:  Normal external female genitalia. No lesions. No discharge or bleeding.             Bladder/urethra:  No lesions or masses, well supported bladder             Vagina: Moderate vaginal atrophy, no lesions.             Cervix: Normal appearing, no lesions.  Atrophic.  Stenosis of the external os.  After the cervix was cleansed with Betadine x 3, os was attempted to be dilated using Cytobrush, ECC, and os finder without success.               Uterus: Small, mobile, no parametrial involvement or nodularity.             Adnexa: Mild fullness in bilateral adnexa, no nodularity.   Rectal: Deferred.  LABORATORY AND RADIOLOGIC DATA:  Outside medical records were reviewed to synthesize the above history, along with the history and physical obtained during the visit.   Lab Results  Component Value Date   WBC 4.5 11/04/2023   HGB 13.1 11/04/2023   HCT 38.5 11/04/2023   PLT 282 11/04/2023   GLUCOSE 79 11/04/2023   CHOL 158 01/07/2023   TRIG 113 01/07/2023   HDL 54 01/07/2023   LDLCALC 84 01/07/2023   ALT 21 11/04/2023   AST 26 11/04/2023   NA 131 (L) 11/04/2023   K 5.2 (H) 11/04/2023   CL 101 11/04/2023   CREATININE 0.47 11/04/2023   BUN <5 (L) 11/04/2023  CO2 25 11/04/2023   TSH 2.290 10/20/2018   INR 1.08 02/11/2012   HGBA1C 5.5 04/14/2018

## 2023-12-02 ENCOUNTER — Inpatient Hospital Stay: Admitting: Gynecologic Oncology

## 2023-12-02 ENCOUNTER — Ambulatory Visit

## 2023-12-02 ENCOUNTER — Encounter: Payer: Self-pay | Admitting: Gynecologic Oncology

## 2023-12-02 VITALS — BP 128/64 | HR 73 | Temp 98.6°F | Resp 19 | Ht 65.0 in | Wt 114.2 lb

## 2023-12-02 DIAGNOSIS — Z85820 Personal history of malignant melanoma of skin: Secondary | ICD-10-CM | POA: Diagnosis not present

## 2023-12-02 DIAGNOSIS — Z853 Personal history of malignant neoplasm of breast: Secondary | ICD-10-CM | POA: Insufficient documentation

## 2023-12-02 DIAGNOSIS — C50411 Malignant neoplasm of upper-outer quadrant of right female breast: Secondary | ICD-10-CM

## 2023-12-02 DIAGNOSIS — N952 Postmenopausal atrophic vaginitis: Secondary | ICD-10-CM | POA: Insufficient documentation

## 2023-12-02 DIAGNOSIS — D398 Neoplasm of uncertain behavior of other specified female genital organs: Secondary | ICD-10-CM | POA: Diagnosis not present

## 2023-12-02 DIAGNOSIS — N949 Unspecified condition associated with female genital organs and menstrual cycle: Secondary | ICD-10-CM

## 2023-12-02 DIAGNOSIS — M810 Age-related osteoporosis without current pathological fracture: Secondary | ICD-10-CM | POA: Insufficient documentation

## 2023-12-02 DIAGNOSIS — C6932 Malignant neoplasm of left choroid: Secondary | ICD-10-CM

## 2023-12-02 DIAGNOSIS — M5440 Lumbago with sciatica, unspecified side: Secondary | ICD-10-CM | POA: Insufficient documentation

## 2023-12-02 DIAGNOSIS — E039 Hypothyroidism, unspecified: Secondary | ICD-10-CM | POA: Insufficient documentation

## 2023-12-02 NOTE — Patient Instructions (Addendum)
 It was nice to meet you today.  We discussed findings of what appeared to be cysts on both of your ovaries that have been stable to slightly increased in size over the last year on multiple imaging studies including CT scan and most recently MRI.  Overall, these masses do not have significant features that raise concern for a precancer or cancerous process but on MRI do have some complexity, meaning they do not look simply like water balloons.  We discussed several options for management including surgery now to rule out the possibility of a precancerous or cancerous process.  We also discussed getting follow-up imaging to make sure that there is not a significant change in the size or appearance of these masses.  I also recommended that we get some additional blood tests or tumor markers, which can sometimes be high in precancer and cancer of the ovary.  Based on our discussion, you would like to get the blood test the next time you get blood when you see your primary care provider.  My office will schedule you for a follow-up ultrasound 3-4 months after your recent MRI.    I am also placing a referral for you to meet with one of our genetic counselors to discuss genetic testing in the setting of your history of breast cancer and melanoma.  If you have any new or concerning symptoms between now and your follow-up ultrasound, please call my office to let me know.  The number here is [phone].

## 2023-12-06 ENCOUNTER — Telehealth: Payer: Self-pay | Admitting: *Deleted

## 2023-12-06 NOTE — Telephone Encounter (Signed)
 Per provider moved US  appt from April to 12/19 at 3:30 pm. Patient aware

## 2024-01-04 ENCOUNTER — Other Ambulatory Visit: Payer: Self-pay | Admitting: Nurse Practitioner

## 2024-01-04 DIAGNOSIS — G4709 Other insomnia: Secondary | ICD-10-CM

## 2024-01-12 ENCOUNTER — Inpatient Hospital Stay: Admitting: Genetic Counselor

## 2024-01-12 ENCOUNTER — Inpatient Hospital Stay

## 2024-01-13 ENCOUNTER — Ambulatory Visit: Payer: Self-pay

## 2024-01-26 ENCOUNTER — Ambulatory Visit (INDEPENDENT_AMBULATORY_CARE_PROVIDER_SITE_OTHER): Payer: Self-pay | Admitting: Nurse Practitioner

## 2024-01-26 ENCOUNTER — Ambulatory Visit: Payer: Self-pay

## 2024-01-26 ENCOUNTER — Encounter: Payer: Self-pay | Admitting: Nurse Practitioner

## 2024-01-26 VITALS — BP 110/60 | HR 88 | Temp 98.4°F | Ht 65.0 in | Wt 115.6 lb

## 2024-01-26 VITALS — BP 110/60 | HR 88 | Temp 98.4°F | Ht 65.0 in | Wt 115.0 lb

## 2024-01-26 DIAGNOSIS — R7309 Other abnormal glucose: Secondary | ICD-10-CM | POA: Diagnosis not present

## 2024-01-26 DIAGNOSIS — E875 Hyperkalemia: Secondary | ICD-10-CM | POA: Diagnosis not present

## 2024-01-26 DIAGNOSIS — Z2821 Immunization not carried out because of patient refusal: Secondary | ICD-10-CM | POA: Diagnosis not present

## 2024-01-26 DIAGNOSIS — Z0000 Encounter for general adult medical examination without abnormal findings: Secondary | ICD-10-CM

## 2024-01-26 DIAGNOSIS — G4709 Other insomnia: Secondary | ICD-10-CM

## 2024-01-26 DIAGNOSIS — I700 Atherosclerosis of aorta: Secondary | ICD-10-CM

## 2024-01-26 DIAGNOSIS — Z171 Estrogen receptor negative status [ER-]: Secondary | ICD-10-CM | POA: Diagnosis not present

## 2024-01-26 DIAGNOSIS — M810 Age-related osteoporosis without current pathological fracture: Secondary | ICD-10-CM | POA: Diagnosis not present

## 2024-01-26 DIAGNOSIS — C50411 Malignant neoplasm of upper-outer quadrant of right female breast: Secondary | ICD-10-CM | POA: Diagnosis not present

## 2024-01-26 MED ORDER — ZOLPIDEM TARTRATE 5 MG PO TABS: 5.0000 mg | ORAL_TABLET | Freq: Every evening | ORAL | 5 refills | Status: AC | PRN

## 2024-01-26 NOTE — Progress Notes (Signed)
 Subjective:   Virginia [name] is a 73 y.o. female who presents for a [employer] Visit.  Allergies (verified) Antihistamines, diphenhydramine-type; Ofirmev  [acetaminophen ]; Phenergan [promethazine]; and Scopolamine    History: Past Medical History:  Diagnosis Date   Abdominal distension    Abdominal pain    Age-related osteoporosis without current pathological fracture 07/08/2023   Allergy 02/05/1984   Chemical allergies/severe   Anxiety 02/2018   Off and on   Arthritis    hands, feet   Back pain with radiation    Breast cancer (HCC) 02/21/2015   Lumpectomy 04/2015; no spread of cancer detected   Cancer (HCC)    right breast   Cataract 02/2015   Mild   Cough    Dysrhythmia    rare palpitation   Encounter for annual health examination 07/08/2023   Hypothyroidism    no currently on medication   Low back pain with sciatica    Lymph nodes enlarged    Nasal congestion    Palpitations    Seizures (HCC)    30+ years ago, medication related   Sore throat    Past Surgical History:  Procedure Laterality Date   APPENDECTOMY  10/03/2011   BACK SURGERY     lumbar fusion   BREAST LUMPECTOMY WITH SENTINEL LYMPH NODE BIOPSY Right 04/24/2015   Procedure: RIGHT BREAST LUMPECTOMY WITH SENTINEL LYMPH NODE BIOPSY;  Surgeon: Jina Nephew, MD;  Location: [facility];  Service: General;  Laterality: Right;   BREAST SURGERY  Jan./2017   Lumpectomy, [place]   CESAREAN SECTION  09/09/1981   EYE SURGERY  04/2023   Brachytherapy/ [facility]- 5 days in hosp.   LAPAROSCOPIC APPENDECTOMY  10/03/2011   Procedure: APPENDECTOMY LAPAROSCOPIC;  Surgeon: Dann FORBES Hummer, MD;  Location: [facility] OR;  Service: General;  Laterality: N/A;   SPINE SURGERY  Nov./2013   Lower spinal fusion   WISDOM TOOTH EXTRACTION  1973 - approximate   Family History  Problem Relation Age of Onset   Arthritis Mother    Vision loss Mother    Emphysema Father    COPD Father    Early death  Father    Heart disease Father    Hypertension Father    Alcohol abuse Sister    Anxiety disorder Sister    Asthma Sister    COPD Sister    Early death Brother    Cancer Brother        diagnosed in 60s (found in brain and bones)   Alcohol abuse Paternal Grandmother    Early death Paternal Grandmother    Varicose Veins Maternal Aunt    Early death Maternal Grandmother    Alcohol abuse Paternal Grandfather    Social History   Occupational History   Occupation: retired  Tobacco Use   Smoking status: Never   Smokeless tobacco: Never  Vaping Use   Vaping status: Never Used  Substance and Sexual Activity   Alcohol use: Never   Drug use: Never   Sexual activity: Not Currently    Birth control/protection: Post-menopausal    Comment: Vaginal dryness   Tobacco Counseling Counseling given: Not Answered  SDOH Screenings   Food Insecurity: Food Insecurity Present (01/26/2024)  Housing: Low Risk  (01/26/2024)  Transportation Needs: No Transportation Needs (01/26/2024)  Utilities: Not At Risk (01/26/2024)  Alcohol Screen: Low Risk  (01/26/2024)  Depression (PHQ2-9): Low Risk  (01/26/2024)  Financial Resource Strain: Medium Risk (01/26/2024)  Physical Activity: Insufficiently Active (01/26/2024)  Social Connections: Moderately  Isolated (01/26/2024)  Stress: Stress Concern Present (01/26/2024)  Tobacco Use: Low Risk  (01/26/2024)  Health Literacy: Adequate Health Literacy (01/26/2024)   Depression Screen    01/26/2024    2:52 PM 11/04/2023    3:37 PM 01/07/2023    2:23 PM 12/24/2021    2:47 PM 12/04/2020    2:52 PM 10/30/2020    2:18 PM 10/26/2019    2:24 PM  PHQ 2/9 Scores  PHQ - 2 Score 0 0 0 0 0 0 0     Goals Addressed             This Visit's Progress    Patient Stated       01/26/2024, wants to start walking 10 minutes a day and start back yoga three times a week       Visit info / Clinical Intake: Medicare Wellness Visit Type:: Subsequent Annual Wellness Visit Medicare  Wellness Visit Mode:: In-person (required for WTM) Interpreter Needed?: No Pre-visit prep was completed: yes AWV questionnaire completed by patient prior to visit?: yes Date:: 01/26/24 Living arrangements:: lives with spouse/significant other Patient's Overall Health Status Rating: very good Typical amount of pain: some Does pain affect daily life?: (!) yes (some times) Are you currently prescribed opioids?: (!) yes (rarely)  Dietary Habits and Nutritional Risks How many meals a day?: 2 Eats fruit and vegetables daily?: yes Most meals are obtained by: preparing own meals Diabetic:: no  Functional Status Activities of Daily Living (to include ambulation/medication): Independent Ambulation: Independent with device- listed below Home Assistive Devices/Equipment: Cane Medication Administration: Independent Home Management: Independent Manage your own finances?: yes Primary transportation is: driving Concerns about vision?: (!) yes (decreased vision left eye) Concerns about hearing?: no  Fall Screening Falls in the past year?: 0 Number of falls in past year: 0 Was there an injury with Fall?: 0 Fall Risk Category Calculator: 0 Patient Fall Risk Level: Low Fall Risk  Fall Risk Patient at Risk for Falls Due to: Impaired mobility; Impaired balance/gait Fall risk Follow up: Falls evaluation completed; Falls prevention discussed  Home and Transportation Safety: All rugs have non-skid backing?: N/A, no rugs All stairs or steps have railings?: (!) no Grab bars in the bathtub or shower?: (!) no Have non-skid surface in bathtub or shower?: yes Good home lighting?: yes Regular seat belt use?: yes Hospital stays in the last year:: (!) yes How many hospital stays:: 1 Reason: eye surgery  Cognitive Assessment Difficulty concentrating, remembering, or making decisions? : no Will 6CIT or Mini Cog be Completed: yes What year is it?: 0 points What month is it?: 0 points Give patient  an address phrase to remember (5 components): [address] About what time is it?: 0 points Count backwards from 20 to 1: 0 points Say the months of the year in reverse: 0 points Repeat the address phrase from earlier: 0 points 6 CIT Score: 0 points  Advance Directives (For Healthcare) Does Patient Have a Medical Advance Directive?: No Would patient like information on creating a medical advance directive?: No - Patient declined  Reviewed/Updated  Reviewed/Updated: All        Objective:    Today's Vitals   01/26/24 1450  BP: 110/60  Pulse: 88  Temp: 98.4 F (36.9 C)  TempSrc: Oral  Weight: 115 lb (52.2 kg)  Height: 5' 5 (1.651 m)   Body mass index is 19.14 kg/m.  Current Medications (verified) Outpatient Encounter Medications as of 01/26/2024  Medication Sig  albuterol  (VENTOLIN  HFA) 108 (90 Base) MCG/ACT inhaler Inhale 2 puffs into the lungs every 6 (six) hours as needed for wheezing or shortness of breath.   Ascorbic Acid  (VITAMIN C) POWD Take 2.5 g by mouth 2 (two) times daily.   B COMPLEX VITAMINS PO Take 1 tablet by mouth daily at 2 am.   Cholecalciferol (VITAMIN D PO) Take 4 drops by mouth daily.   EPINEPHrine  (EPIPEN  2-PAK) 0.3 mg/0.3 mL IJ SOAJ injection Inject 0.3 mg into the muscle as needed for anaphylaxis.   GOLDEN SEAL EXTRACT PO Take by mouth.   Magnesium  Glycinate 665 MG CAPS Take 1 capsule by mouth at bedtime as needed.   Melatonin 5 MG CAPS Take 2 capsules by mouth at bedtime.   Multiple Vitamins-Minerals (ZINC PO) Take by mouth. daily   OVER THE COUNTER MEDICATION Lemon balm extract   OVER THE COUNTER MEDICATION [place]  Poppy Extract   oxyCODONE -acetaminophen  (PERCOCET) 5-325 MG tablet Take 0.5-1 tablets by mouth every 6 (six) hours as needed for severe pain.   sodium chloride  (OCEAN) 0.65 % nasal spray Place 1 spray into the nose daily as needed. For congestion.   UNABLE TO FIND Argentyn 23   UNABLE TO FIND Med Name: iodine 4 drops daily    zolpidem  (AMBIEN ) 5 MG tablet TAKE A HALF TABLET BY MOUTH DAILY   L-Lysine HCl 500 MG TABS Take 1,500 mg by mouth.   LYSINE PO Take 4 capsules by mouth daily.   No facility-administered encounter medications on file as of 01/26/2024.   Hearing/Vision screen Hearing Screening - Comments:: Denies hearing issues Vision Screening - Comments:: Regular eye exams, Dr. Greven Immunizations and Health Maintenance Health Maintenance  Topic Date Due   Colonoscopy  Never done   Mammogram  03/28/2016   COVID-19 Vaccine (1) 02/11/2024 (Originally 11/21/1955)   Zoster Vaccines- Shingrix (1 of 2) 04/27/2024 (Originally [birth year]22/1970)   Influenza Vaccine  06/20/2024 (Originally 10/22/2023)   Pneumococcal Vaccine: 50+ Years (1 of 1 - PCV) 07/07/2024 (Originally 11/21/2001)   DTaP/Tdap/Td (1 - Tdap) 01/25/2025 (Originally 11/22/1970)   Medicare Annual Wellness (AWV)  01/25/2025   DEXA SCAN  Completed   Hepatitis C Screening  Completed   Meningococcal B Vaccine  Aged Out        Assessment/Plan:  This is a routine wellness examination for Nelsy.  Patient Care Team: Georgina Speaks, FNP as PCP - General (General Practice) Tina [name], MD as Consulting Physician (Oncology) Viktoria Comer SAUNDERS, MD as Consulting Physician (Gynecologic Oncology) Cheree Banks, MD as Referring Physician (Ophthalmology)  I have personally reviewed and noted the following in the patient's chart:   Medical and social history Use of alcohol, tobacco or illicit drugs  Current medications and supplements including opioid prescriptions. Functional ability and status Nutritional status Physical activity Advanced directives List of other physicians Hospitalizations, surgeries, and ER visits in previous 12 months Vitals Screenings to include cognitive, depression, and falls Referrals and appointments  No orders of the defined types were placed in this encounter.  In addition, I have reviewed and discussed with patient  certain preventive protocols, quality metrics, and best practice recommendations. A written personalized care plan for preventive services as well as general preventive health recommendations were provided to patient.   Ardella FORBES Dawn, LPN   [id number]/06/[id number]   Return in 1 year (on 01/25/2025).  After Visit Summary: (In Person-Printed) AVS printed and given to the patient  Nurse Notes: Information about food pantries given to patient.

## 2024-01-26 NOTE — Patient Instructions (Signed)
 Ms. Isip,  Thank you for taking the time for your Medicare Wellness Visit. I appreciate your continued commitment to your health goals. Please review the care plan we discussed, and feel free to reach out if I can assist you further.  Please note that Annual Wellness Visits do not include a physical exam. Some assessments may be limited, especially if the visit was conducted virtually. If needed, we may recommend an in-person follow-up with your provider.  Ongoing Care Seeing your primary care provider every 3 to 6 months helps us  monitor your health and provide consistent, personalized care.   Referrals If a referral was made during today's visit and you haven't received any updates within two weeks, please contact the referred provider directly to check on the status.  Recommended Screenings:  Health Maintenance  Topic Date Due   Colon Cancer Screening  Never done   Breast Cancer Screening  03/28/2016   Medicare Annual Wellness Visit  01/07/2024   COVID-19 Vaccine (1) 02/11/2024*   Zoster (Shingles) Vaccine (1 of 2) 04/27/2024*   Flu Shot  06/20/2024*   Pneumococcal Vaccine for age over 19 (1 of 1 - PCV) 07/07/2024*   DTaP/Tdap/Td vaccine (1 - Tdap) 01/25/2025*   DEXA scan (bone density measurement)  Completed   Hepatitis C Screening  Completed   Meningitis B Vaccine  Aged Out  *Topic was postponed. The date shown is not the original due date.       01/26/2024    2:58 PM  Advanced Directives  Does Patient Have a Medical Advance Directive? No  Would patient like information on creating a medical advance directive? No - Patient declined    Vision: Annual vision screenings are recommended for early detection of glaucoma, cataracts, and diabetic retinopathy. These exams can also reveal signs of chronic conditions such as diabetes and high blood pressure.  Dental: Annual dental screenings help detect early signs of oral cancer, gum disease, and other conditions linked to overall  health, including heart disease and diabetes.  Please see the attached documents for additional preventive care recommendations.

## 2024-01-26 NOTE — Progress Notes (Signed)
 LILLETTE Kristeen JINNY Gladis, CMA,acting as a neurosurgeon for Gaines Ada, FNP.,have documented all relevant documentation on the behalf of Gaines Ada, FNP,as directed by  Gaines Ada, FNP while in the presence of Gaines Ada, FNP.  Subjective:  Patient ID: Virginia [name] , female    DOB: [birth year] , 73 y.o.   MRN: [id number]  Chief Complaint  Patient presents with   Insomnia    Patient presents today for a insomnia follow up, Patient reports compliance with medication. Patient denies any chest pain, SOB, or headaches. Patient has no concerns today. Patient declines mammogram and colonoscopy.     HPI  Discussed the use of AI scribe software for clinical note transcription with the patient, who gave verbal consent to proceed.  History of Present Illness Virginia [name] is a 73 year old female who presents for a follow-up visit regarding her potassium levels and general health maintenance.  Her sleep schedule is disrupted due to her husband's recent job loss, affecting her routine. She is attempting to adjust back to a first shift schedule, currently experiencing variable sleep durations ranging from three to seven hours per night.  Her potassium levels were elevated during her last visit, and she is scheduled for a recheck today. She consumes foods high in potassium, such as tomatoes and potatoes.  She is dealing with an ovarian cyst, which she notes is 'getting slightly better' and is scheduled for reevaluation in early December.  She has a history of osteoporosis and is considering a bone density test, as it has been nearly three years since her last one. She notes a slight weight gain since her last visit, currently weighing 115 pounds, up from 114 pounds in September.  She declines a colonoscopy and mammogram, citing personal reservations and past experiences with medical procedures. She discusses these concerns with her oncologist.  No significant swelling in feet or ankles, but she  notes occasional pain from past injuries when on her feet for extended periods.  Past Medical History:  Diagnosis Date   Abdominal distension    Abdominal pain    Age-related osteoporosis without current pathological fracture 07/08/2023   Allergy 02/05/1984   Chemical allergies/severe   Anxiety 02/2018   Off and on   Arthritis    hands, feet   Back pain with radiation    Breast cancer (HCC) 02/21/2015   Lumpectomy 04/2015; no spread of cancer detected   Cancer (HCC)    right breast   Cataract 02/2015   Mild   Cough    Dysrhythmia    rare palpitation   Encounter for annual health examination 07/08/2023   Hypothyroidism    no currently on medication   Low back pain with sciatica    Lymph nodes enlarged    Nasal congestion    Palpitations    Seizures (HCC)    30+ years ago, medication related   Sore throat      Family History  Problem Relation Age of Onset   Arthritis Mother    Vision loss Mother    Emphysema Father    COPD Father    Early death Father    Heart disease Father    Hypertension Father    Alcohol abuse Sister    Anxiety disorder Sister    Asthma Sister    COPD Sister    Early death Brother    Cancer Brother        diagnosed in 56s (found in brain and bones)   Alcohol  abuse Paternal Grandmother    Early death Paternal Grandmother    Varicose Veins Maternal Aunt    Early death Maternal Grandmother    Alcohol abuse Paternal Grandfather      Current Outpatient Medications:    albuterol  (VENTOLIN  HFA) 108 (90 Base) MCG/ACT inhaler, Inhale 2 puffs into the lungs every 6 (six) hours as needed for wheezing or shortness of breath., Disp: 8 g, Rfl: 2   Ascorbic Acid  (VITAMIN C) POWD, Take 2.5 g by mouth 2 (two) times daily., Disp: , Rfl:    B COMPLEX VITAMINS PO, Take 1 tablet by mouth daily at 2 am., Disp: , Rfl:    Cholecalciferol (VITAMIN D PO), Take 4 drops by mouth daily., Disp: , Rfl:    EPINEPHrine  (EPIPEN  2-PAK) 0.3 mg/0.3 mL IJ SOAJ injection,  Inject 0.3 mg into the muscle as needed for anaphylaxis., Disp: 1 each, Rfl: 2   GOLDEN SEAL EXTRACT PO, Take by mouth., Disp: , Rfl:    Magnesium  Glycinate 665 MG CAPS, Take 1 capsule by mouth at bedtime as needed., Disp: 30 capsule, Rfl: 1   Melatonin 5 MG CAPS, Take 2 capsules by mouth at bedtime., Disp: , Rfl:    Multiple Vitamins-Minerals (ZINC PO), Take by mouth. daily, Disp: , Rfl:    OVER THE COUNTER MEDICATION, Lemon balm extract, Disp: , Rfl:    OVER THE COUNTER MEDICATION, [place]  Poppy Extract, Disp: , Rfl:    oxyCODONE -acetaminophen  (PERCOCET) 5-325 MG tablet, Take 0.5-1 tablets by mouth every 6 (six) hours as needed for severe pain., Disp: 20 tablet, Rfl: 0   sodium chloride  (OCEAN) 0.65 % nasal spray, Place 1 spray into the nose daily as needed. For congestion., Disp: , Rfl:    UNABLE TO FIND, Argentyn 23, Disp: , Rfl:    UNABLE TO FIND, Med Name: iodine 4 drops daily, Disp: , Rfl:    L-Lysine HCl 500 MG TABS, Take 1,500 mg by mouth., Disp: , Rfl:    LYSINE PO, Take 4 capsules by mouth daily., Disp: , Rfl:    zolpidem  (AMBIEN ) 5 MG tablet, Take 1 tablet (5 mg total) by mouth at bedtime as needed for sleep., Disp: 15 tablet, Rfl: 5   Allergies  Allergen Reactions   Antihistamines, Diphenhydramine-Type Other (See Comments)    Dehydration and fatigue    Ofirmev  [Acetaminophen ] Other (See Comments)    Drop in BP   Phenergan [Promethazine] Other (See Comments)    Thinks it was a seizure   Scopolamine  Nausea And Vomiting     Review of Systems  Constitutional: Negative.   HENT: Negative.    Eyes: Negative.   Respiratory: Negative.    Cardiovascular: Negative.   Gastrointestinal: Negative.   Neurological: Negative.   Psychiatric/Behavioral:  Positive for sleep disturbance.      Today's Vitals   01/26/24 1444  BP: 110/60  Pulse: 88  Temp: 98.4 F (36.9 C)  TempSrc: Oral  Weight: 115 lb 9.6 oz (52.4 kg)  Height: 5' 5 (1.651 m)  PainSc: 0-No pain   Body mass  index is 19.24 kg/m.  Wt Readings from Last 3 Encounters:  01/26/24 115 lb (52.2 kg)  01/26/24 115 lb 9.6 oz (52.4 kg)  12/02/23 114 lb 3.2 oz (51.8 kg)     Objective:  Physical Exam Vitals and nursing note reviewed.  Constitutional:      General: She is not in acute distress.    Appearance: Normal appearance. She is normal weight.  Cardiovascular:  Rate and Rhythm: Normal rate and regular rhythm.     Pulses: Normal pulses.     Heart sounds: Normal heart sounds. No murmur heard. Pulmonary:     Effort: Pulmonary effort is normal. No respiratory distress.     Breath sounds: Normal breath sounds. No wheezing.  Musculoskeletal:        General: Normal range of motion.     Comments: She is using a cane to ambulate  Skin:    General: Skin is warm and dry.     Capillary Refill: Capillary refill takes less than 2 seconds.  Neurological:     General: No focal deficit present.     Mental Status: She is alert and oriented to person, place, and time.     Cranial Nerves: No cranial nerve deficit.     Motor: No weakness.  Psychiatric:        Mood and Affect: Mood normal.        Behavior: Behavior normal.        Thought Content: Thought content normal.        Judgment: Judgment normal.      Assessment And Plan:  Abnormal glucose Assessment & Plan: HgbA1c is stable, continue focusing on healthy diet  Orders: -     Hemoglobin A1c -     CMP14+EGFR  Other insomnia Assessment & Plan: Sleep disrupted by schedule changes and husband's health issues. Continue current medications  Orders: -     Zolpidem  Tartrate; Take 1 tablet (5 mg total) by mouth at bedtime as needed for sleep.  Dispense: 15 tablet; Refill: 5 -     CMP14+EGFR  Influenza vaccination declined  Herpes zoster vaccination declined  Tetanus, diphtheria, and acellular pertussis (Tdap) vaccination declined  Aortic atherosclerosis Assessment & Plan: She is currently not on a statin, discussed the risk of not  taking a statin and the importance due to having plaque build up in her vessels   Other insomnia Assessment & Plan: Sleep disrupted by schedule changes and husband's health issues. Continue current medications  Orders: -     Zolpidem  Tartrate; Take 1 tablet (5 mg total) by mouth at bedtime as needed for sleep.  Dispense: 15 tablet; Refill: 5 -     CMP14+EGFR  Serum potassium elevated Assessment & Plan: Potassium levels elevated, likely due to dietary intake. Risk of cardiac issues. - Rechecked potassium levels. - Advised reduction in high-potassium foods, especially white potatoes and bananas, if levels remain high.  Orders: -     CMP14+EGFR  Malignant neoplasm of upper-outer quadrant of right breast in female, estrogen receptor negative (HCC) Assessment & Plan: She has continued to decline a mammogram.  Discussed mammograms and colonoscopies. She has concerns about pain. Explained temporary nature of mammogram pain versus untreated cancer. - Encouraged consideration of mammogram and colonoscopy if she changes her mind.   Age-related osteoporosis without current pathological fracture Assessment & Plan: Osteoporosis present. Bone density scan overdue. - Ordered bone density scan.  Orders: -     DG Bone Density; Future     Return for keep same next.  Patient was given opportunity to ask questions. Patient verbalized understanding of the plan and was able to repeat key elements of the plan. All questions were answered to their satisfaction.   LILLETTE Gaines Ada, FNP, have reviewed all documentation for this visit. The documentation on 01/26/24 for the exam, diagnosis, procedures, and orders are all accurate and complete.   IF YOU HAVE BEEN REFERRED TO A  SPECIALIST, IT MAY TAKE 1-2 WEEKS TO SCHEDULE/PROCESS THE REFERRAL. IF YOU HAVE NOT HEARD FROM US /SPECIALIST IN TWO WEEKS, PLEASE GIVE US  A CALL AT [phone].

## 2024-01-27 LAB — CMP14+EGFR
ALT: 19 IU/L (ref 0–32)
AST: 24 IU/L (ref 0–40)
Albumin: 4.1 g/dL (ref 3.8–4.8)
Alkaline Phosphatase: 141 IU/L — ABNORMAL HIGH (ref 49–135)
BUN/Creatinine Ratio: 7 — ABNORMAL LOW (ref 12–28)
BUN: 4 mg/dL — ABNORMAL LOW (ref 8–27)
Bilirubin Total: 0.3 mg/dL (ref 0.0–1.2)
CO2: 22 mmol/L (ref 20–29)
Calcium: 9.2 mg/dL (ref 8.7–10.3)
Chloride: 98 mmol/L (ref 96–106)
Creatinine, Ser: 0.57 mg/dL (ref 0.57–1.00)
Globulin, Total: 2.3 g/dL (ref 1.5–4.5)
Glucose: 99 mg/dL (ref 70–99)
Potassium: 4.6 mmol/L (ref 3.5–5.2)
Sodium: 134 mmol/L (ref 134–144)
Total Protein: 6.4 g/dL (ref 6.0–8.5)
eGFR: 96 mL/min/1.73 (ref >59)

## 2024-01-27 LAB — HEMOGLOBIN A1C
Est. average glucose Bld gHb Est-mCnc: 105 mg/dL
Hgb A1c MFr Bld: 5.3 % (ref 4.8–5.6)

## 2024-02-06 ENCOUNTER — Ambulatory Visit: Payer: Self-pay | Admitting: Nurse Practitioner

## 2024-02-06 DIAGNOSIS — E875 Hyperkalemia: Secondary | ICD-10-CM | POA: Insufficient documentation

## 2024-02-06 NOTE — Assessment & Plan Note (Addendum)
 She has continued to decline a mammogram.  Discussed mammograms and colonoscopies. She has concerns about pain. Explained temporary nature of mammogram pain versus untreated cancer. - Encouraged consideration of mammogram and colonoscopy if she changes her mind.

## 2024-02-06 NOTE — Assessment & Plan Note (Signed)
 Sleep disrupted by schedule changes and husband's health issues. Continue current medications

## 2024-02-06 NOTE — Assessment & Plan Note (Signed)
She is currently not on a statin, discussed the risk of not taking a statin and the importance due to having plaque build up in her vessels

## 2024-02-06 NOTE — Assessment & Plan Note (Signed)
 Potassium levels elevated, likely due to dietary intake. Risk of cardiac issues. - Rechecked potassium levels. - Advised reduction in high-potassium foods, especially white potatoes and bananas, if levels remain high.

## 2024-02-06 NOTE — Assessment & Plan Note (Signed)
 HgbA1c is stable, continue focusing on healthy diet.

## 2024-02-06 NOTE — Assessment & Plan Note (Signed)
 Osteoporosis present. Bone density scan overdue. - Ordered bone density scan.

## 2024-02-21 ENCOUNTER — Ambulatory Visit (HOSPITAL_COMMUNITY): Admission: RE | Admit: 2024-02-21 | Discharge: 2024-02-21 | Disposition: A | Source: Ambulatory Visit

## 2024-02-21 DIAGNOSIS — C6932 Malignant neoplasm of left choroid: Secondary | ICD-10-CM | POA: Insufficient documentation

## 2024-02-21 DIAGNOSIS — Z85840 Personal history of malignant neoplasm of eye: Secondary | ICD-10-CM | POA: Diagnosis not present

## 2024-02-21 DIAGNOSIS — K8689 Other specified diseases of pancreas: Secondary | ICD-10-CM | POA: Diagnosis not present

## 2024-02-21 DIAGNOSIS — C4310 Malignant melanoma of unspecified eyelid, including canthus: Secondary | ICD-10-CM | POA: Diagnosis not present

## 2024-02-21 MED ORDER — GADOBUTROL 1 MMOL/ML IV SOLN
5.0000 mL | Freq: Once | INTRAVENOUS | Status: AC | PRN
  Administered 2024-02-21: 5 mL via INTRAVENOUS

## 2024-02-24 ENCOUNTER — Ambulatory Visit: Payer: Self-pay

## 2024-02-29 DIAGNOSIS — C6932 Malignant neoplasm of left choroid: Secondary | ICD-10-CM | POA: Diagnosis not present

## 2024-02-29 DIAGNOSIS — H2513 Age-related nuclear cataract, bilateral: Secondary | ICD-10-CM | POA: Diagnosis not present

## 2024-02-29 DIAGNOSIS — H35371 Puckering of macula, right eye: Secondary | ICD-10-CM | POA: Diagnosis not present

## 2024-03-06 NOTE — Progress Notes (Addendum)
 [facility] OFFICE PROGRESS NOTE  Patient Care Team: Virginia Speaks, FNP as PCP - General (General Practice) Virginia [name], MD as Consulting Physician (Oncology) Virginia Comer SAUNDERS, MD as Consulting Physician (Gynecologic Oncology) Virginia Banks, MD as Referring Physician (Ophthalmology)  Addendum HLA-A*02:01 is POSITIVE.   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   Per outside records with progressive enlarging uveal melanoma. Left eye periphery pigmented triangular shaped lesion, 13 x 13 x 2.72 mm thick report of Class 2 Prame negative. Will obtain [employer] report. S/p I 125 plaque radiotherapy OS 04/26/23.   Distant metastasis-free survival at five years based on class and PRAME expression reported 58% for Class 2 and PRAME negative patients. J Clin Oncol. 2024 Oct;42(28):3319-3329.   CT chest MRI abdomen negative for metastases in December. 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 Follow-up examination with MRI abdomen with MRCP protocol in 2 years is recommended.  Assessment & Plan Choroid melanoma of left eye (HCC) Labs, MRI of the abdomen about first week of March Follow-up with me during second week to March Reporting symptoms earlier HLA A A*02:01 testing obtained today Continue follow-up with ophthalmology Pancreatic cyst Redemonstration of a 4 x 7 mm T2 hyperintense lesion in the pancreatic body, favored to represent pancreatic side-branch IPMN. This will be followed on future MRI for surveillance of uveal melanoma.  Orders Placed This Encounter  Procedures   MR Abdomen W Wo Contrast    Standing Status:   Future    Expected Date:    05/23/2024    Expiration Date:   03/09/2025    If indicated for the ordered procedure, I authorize the administration of contrast media per Radiology protocol:   Yes    What is the patient's sedation requirement?:   No Sedation    Does the patient have a pacemaker or implanted devices?:   No    Preferred imaging location?:   [facility] (table limit - 500lbs)   CBC with Differential (Cancer Center Only)    Standing Status:   Future    Expiration Date:   03/09/2025   CMP (Cancer Center only)    Standing Status:   Future    Expiration Date:   03/09/2025     Pauletta [name] Tina, MD  INTERVAL HISTORY: Patient returns for follow-up.  Oncology History  Breast cancer of upper-outer quadrant of right female breast (HCC) (Resolved)  03/21/2015 Mammogram   Right breast mass. Irregularly shaped upper central posterior third measuring 1.1 x 1.1 x 1 cm   03/29/2015 Initial Diagnosis   Right breast biopsy: Invasive ductal carcinoma, grade 1, ER 0%, PR 0%, HER-2 positive ratio 9.96, Ki-67 20%; T1cN0 stage IA clinical stage   04/24/2015 Surgery    Right lumpectomy: IDC grade 2, 1.3 cm, with DCIS high-grade, margins negative, DCIS margin 0.1-0.2 cm anterior and posterior margin focally , 0/3 lymph nodes negative, ER 0%, PR 0%, HER-2 positive ratio 9.96, Ki-67 20%, T1 cN0 stage IA   Choroid melanoma of left eye (HCC)  01/07/2023 Initial Diagnosis   Choroid melanoma of left eye (HCC) US  measurements  Initial thickness was 1.69mm.  02/26/15 1.7 mm. 09/03/15 1.70mm 04/07/16 1.74 mm 10/06/16 1.74 mm 04/09/17 1.70 mm  04/05/18 1.70 mm 04/11/19 1.70 mm 04/10/20 1.6 mm  08/19/2021 1.6 mm  12/01/2022 2.4 mm 04/06/23 2.72 mm ---s/p Plaque---    04/06/2023 Imaging   B-Scan and Quantitative A-Scan Ultrasound, Posterior Segment, Left Eye   The lesion measures 2.72 mm in A-P dimensions with a transverse base of  14.74 mm and a . longitudinal base of 10.25 mm. Standardized diagnostic  A-scan measures 2.71 mm  in A-P dimensions. The lesion exhibits low  internal reflectivity. There is evidence of internal vascularity. with  regular internal structure. There does not appear to be evidence of  extrascleral extension. There does not appear to be evidence of choroidal  excavation.  Impression: Findings consistent with choroidal melanoma, of left eye.  Continued interval changes of growth indicated.  Interpretation reviewed and agreed by attending physician.    04/26/2023 -  Radiation Therapy   I 125 plaque radiotherapy 04/26/23    07/08/2023 Tumor Marker   Alk phos 59. AST 20 ALT 17 Cr 0.9 WBC 6.6 Hgb 11.9 MCV 81 plt 338      PHYSICAL EXAMINATION: ECOG PERFORMANCE STATUS: 1  Vitals:   03/09/24 1545 03/09/24 1550  BP: (!) 142/89 134/83  Pulse: 67   Resp: 16   Temp: (!) 97.5 F (36.4 C)   SpO2: 99%    Filed Weights   03/09/24 1545  Weight: 116 lb (52.6 kg)   GENERAL: alert, no distress and comfortable SKIN: skin color normal and no jaundice  LUNGS: clear to auscultation and percussion with normal breathing effort HEART: regular rate & rhythm  ABDOMEN: abdomen soft, non-tender and nondistended.   Relevant data reviewed during this visit included labs.  New labs and imaging ordered.

## 2024-03-09 ENCOUNTER — Other Ambulatory Visit (HOSPITAL_COMMUNITY)

## 2024-03-09 ENCOUNTER — Ambulatory Visit

## 2024-03-09 ENCOUNTER — Other Ambulatory Visit: Payer: Self-pay

## 2024-03-09 ENCOUNTER — Inpatient Hospital Stay

## 2024-03-09 VITALS — BP 134/83 | HR 67 | Temp 97.5°F | Resp 16 | Ht 65.0 in | Wt 116.0 lb

## 2024-03-09 DIAGNOSIS — K862 Cyst of pancreas: Secondary | ICD-10-CM | POA: Diagnosis not present

## 2024-03-09 DIAGNOSIS — Z853 Personal history of malignant neoplasm of breast: Secondary | ICD-10-CM | POA: Insufficient documentation

## 2024-03-09 DIAGNOSIS — C6932 Malignant neoplasm of left choroid: Secondary | ICD-10-CM | POA: Insufficient documentation

## 2024-03-09 DIAGNOSIS — Z923 Personal history of irradiation: Secondary | ICD-10-CM | POA: Diagnosis not present

## 2024-03-09 LAB — CMP (CANCER CENTER ONLY)
ALT: 19 U/L (ref 0–44)
AST: 26 U/L (ref 15–41)
Albumin: 4.3 g/dL (ref 3.5–5.0)
Alkaline Phosphatase: 148 U/L — ABNORMAL HIGH (ref 38–126)
Anion gap: 10 (ref 5–15)
BUN: <5 mg/dL — ABNORMAL LOW (ref 8–23)
CO2: 21 mmol/L — ABNORMAL LOW (ref 22–32)
Calcium: 9.3 mg/dL (ref 8.9–10.3)
Chloride: 98 mmol/L (ref 98–111)
Creatinine: 0.5 mg/dL (ref 0.44–1.00)
GFR, Estimated: >60 mL/min (ref >60)
Glucose, Bld: 66 mg/dL — ABNORMAL LOW (ref 70–99)
Potassium: 4.7 mmol/L (ref 3.5–5.1)
Sodium: 129 mmol/L — ABNORMAL LOW (ref 135–145)
Total Bilirubin: 0.3 mg/dL (ref 0.0–1.2)
Total Protein: 6.6 g/dL (ref 6.5–8.1)

## 2024-03-09 LAB — CBC WITH DIFFERENTIAL (CANCER CENTER ONLY)
Abs Immature Granulocytes: 0.01 K/uL (ref 0.00–0.07)
Basophils Absolute: 0 K/uL (ref 0.0–0.1)
Basophils Relative: 1 %
Eosinophils Absolute: 0.1 K/uL (ref 0.0–0.5)
Eosinophils Relative: 3 %
HCT: 36.8 % (ref 36.0–46.0)
Hemoglobin: 12.6 g/dL (ref 12.0–15.0)
Immature Granulocytes: 0 %
Lymphocytes Relative: 26 %
Lymphs Abs: 1.2 K/uL (ref 0.7–4.0)
MCH: 31 pg (ref 26.0–34.0)
MCHC: 34.2 g/dL (ref 30.0–36.0)
MCV: 90.4 fL (ref 80.0–100.0)
Monocytes Absolute: 0.5 K/uL (ref 0.1–1.0)
Monocytes Relative: 12 %
Neutro Abs: 2.8 K/uL (ref 1.7–7.7)
Neutrophils Relative %: 58 %
Platelet Count: 291 K/uL (ref 150–400)
RBC: 4.07 MIL/uL (ref 3.87–5.11)
RDW: 12.4 % (ref 11.5–15.5)
WBC Count: 4.6 K/uL (ref 4.0–10.5)
nRBC: 0 % (ref 0.0–0.2)

## 2024-03-09 NOTE — Assessment & Plan Note (Addendum)
 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 This will be followed on future MRI for surveillance of uveal melanoma.

## 2024-03-09 NOTE — Assessment & Plan Note (Addendum)
 Labs, MRI of the abdomen about first week of March Follow-up with me during second week to March Reporting symptoms earlier HLA A A*02:01 testing obtained today Continue follow-up with ophthalmology

## 2024-03-10 ENCOUNTER — Ambulatory Visit (HOSPITAL_COMMUNITY)
Admission: RE | Admit: 2024-03-10 | Discharge: 2024-03-10 | Disposition: A | Discharge status: Home / Self Care | Source: Ambulatory Visit | Attending: Gynecologic Oncology | Admitting: Gynecologic Oncology

## 2024-03-10 DIAGNOSIS — N949 Unspecified condition associated with female genital organs and menstrual cycle: Secondary | ICD-10-CM | POA: Insufficient documentation

## 2024-03-15 LAB — MISC LABCORP TEST (SEND OUT): Labcorp test code: 167107

## 2024-03-18 IMAGING — CT CT CERVICAL SPINE W/O CM
3 of 4 series · 11 of 33 positions shown, 13 images · non-contrast
Comparison: None.

CLINICAL DATA: Fall



[Series 9: c_spine 2.0 st · axial · 0.29mm/px · z∈[+294,+418]mm · 3 of 94 slices shown, 4 images]
[im 16/94  soft-tissue]
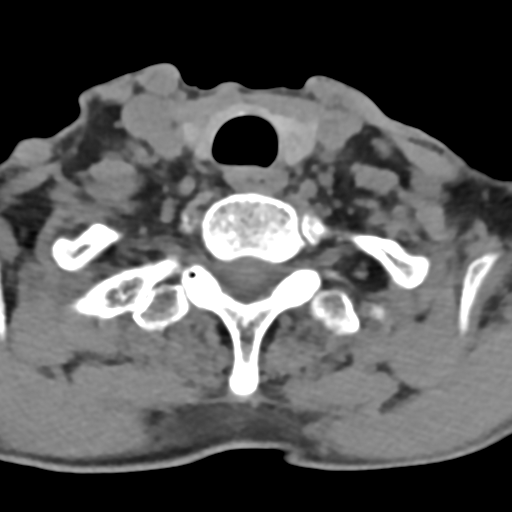
[im 16/94  bone]
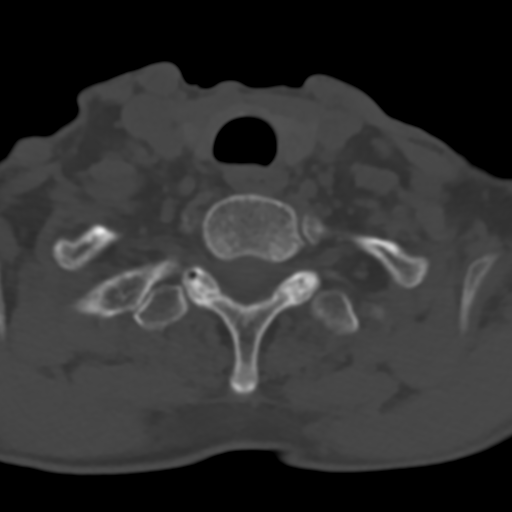
[im 47/94  bone]
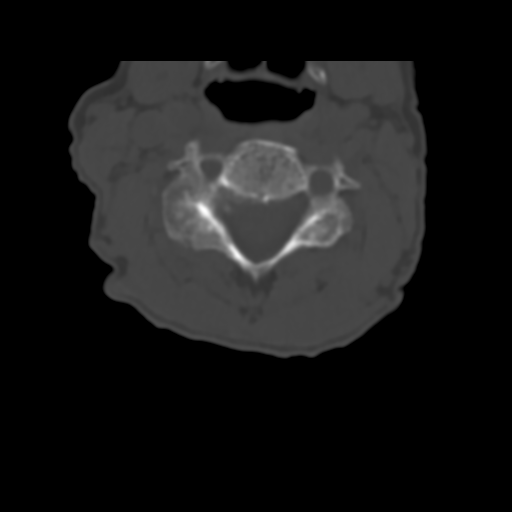
[im 78/94  bone]
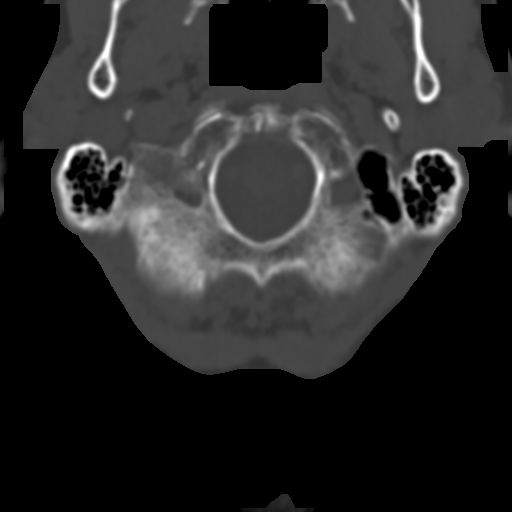

[Series 12: coronal bone · coronal · 0.26mm/px · 3 of 61 slices shown]
[im 13/61  bone]
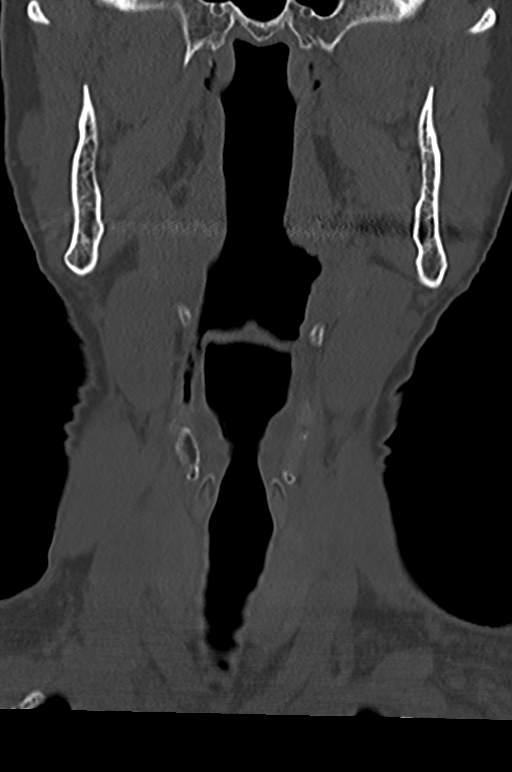
[im 25/61  bone]
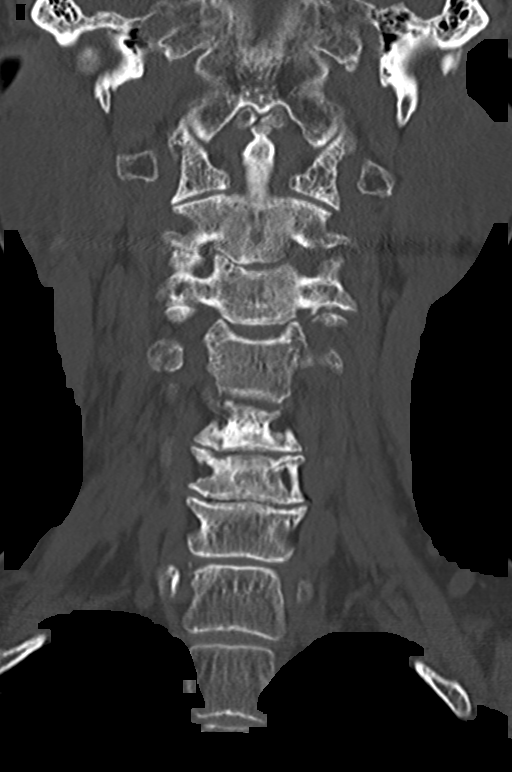
[im 37/61  bone]
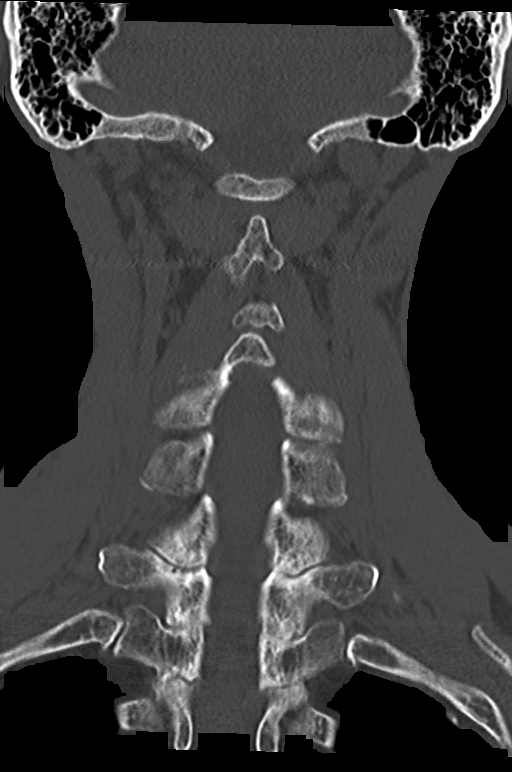

[Series 13: sagittal bone · sagittal · 0.23mm/px · 5 of 67 slices shown, 6 images]
[im 23/67  bone]
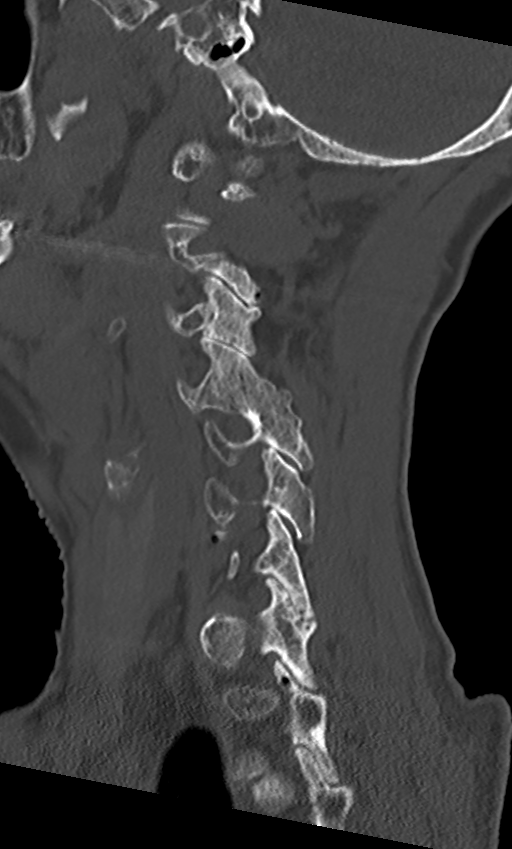
[im 28/67  bone]
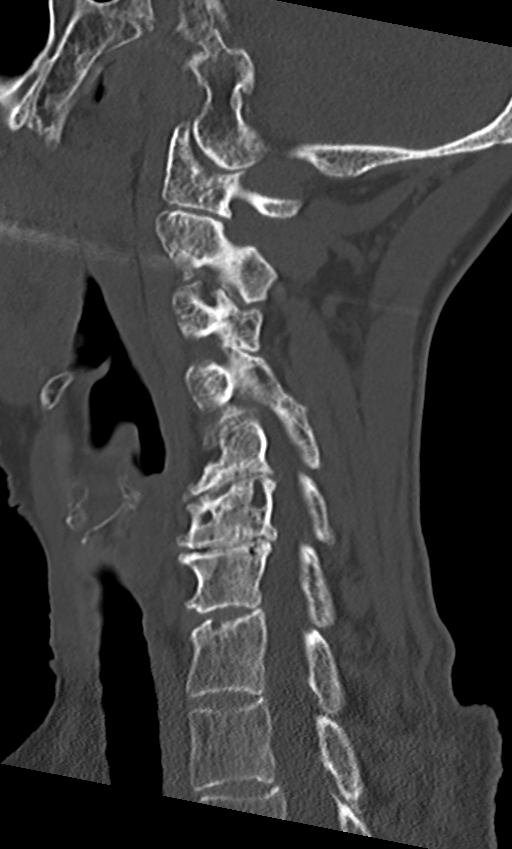
[im 34/67  soft-tissue]
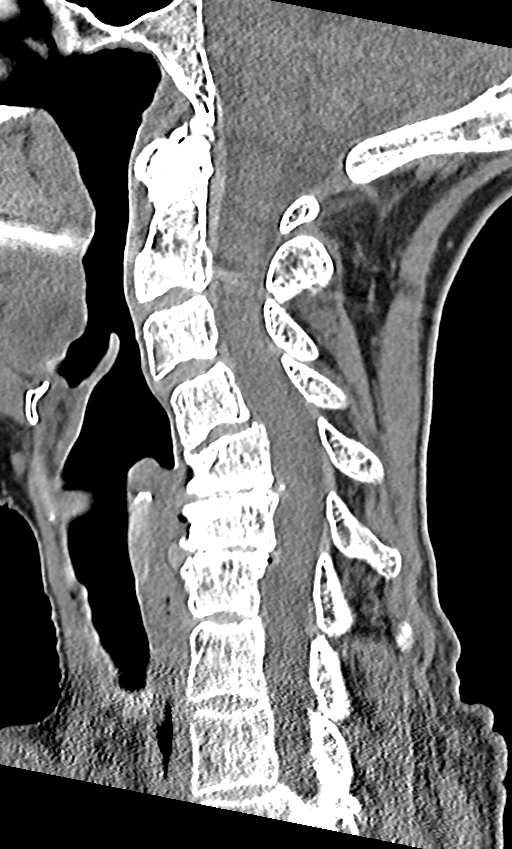
[im 34/67  bone]
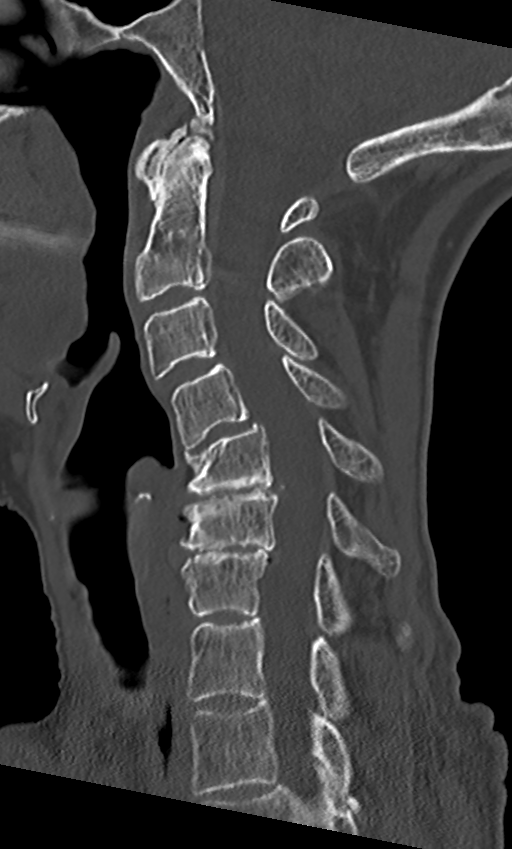
[im 39/67  bone]
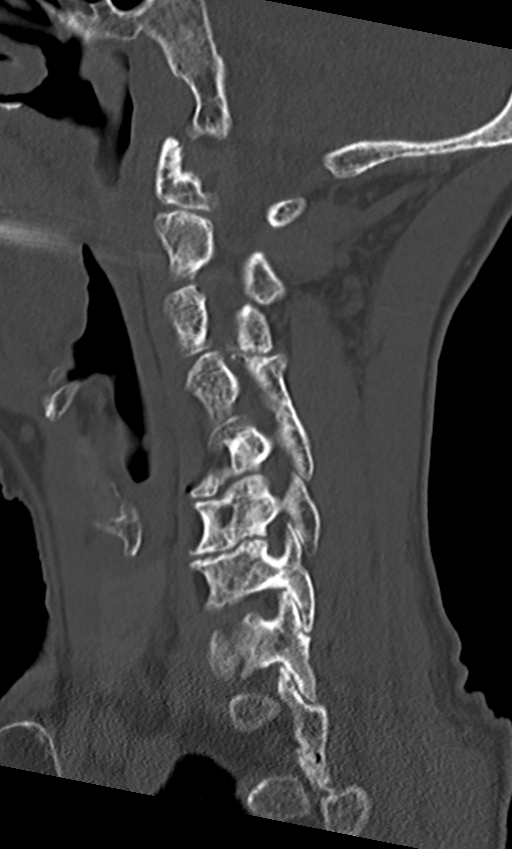
[im 45/67  bone]
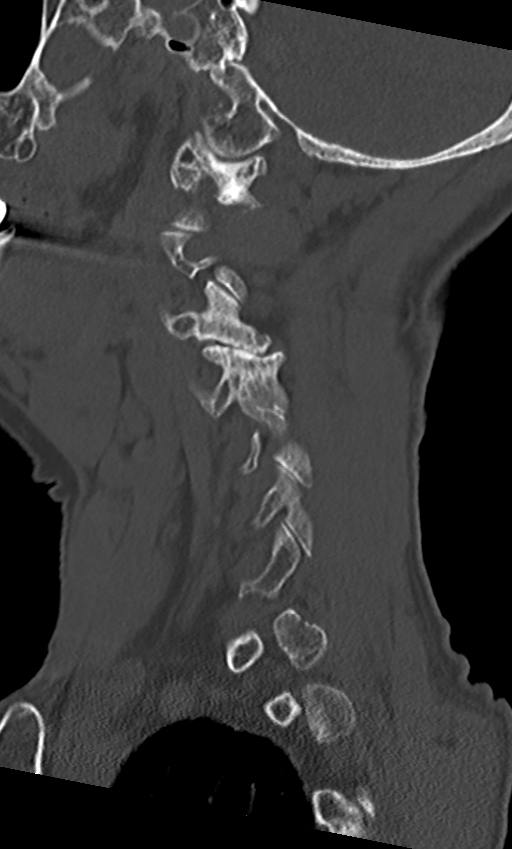

[11 of 33 positions shown; findings below may reference images not displayed]

FINDINGS: CT HEAD FINDINGS

Brain: There is no mass, hemorrhage or extra-axial collection. The
size and configuration of the ventricles and extra-axial CSF spaces
are normal. The brain parenchyma is normal, without evidence of
acute or chronic infarction.

Vascular: No abnormal hyperdensity of the major intracranial
arteries or dural venous sinuses. No intracranial atherosclerosis.

Skull: The visualized skull base, calvarium and extracranial soft
tissues are normal.

Sinuses/Orbits: No fluid levels or advanced mucosal thickening of
the visualized paranasal sinuses. No mastoid or middle ear effusion.
The orbits are normal.

CT CERVICAL SPINE FINDINGS

Alignment: Reversal of normal cervical lordosis. Grade 1
anterolisthesis at C4-5.

Skull base and vertebrae: No acute fracture.

Soft tissues and spinal canal: No prevertebral fluid or swelling. No
visible canal hematoma.

Disc levels: Disc space narrowing is greatest at C5-6 and C6-7. The
C4-5 facets are fused bilaterally.

Upper chest: No pneumothorax, pulmonary nodule or pleural effusion.

Other: Normal visualized paraspinal cervical soft tissues.
IMPRESSION: 1. No acute intracranial abnormality.
2. No acute fracture or static subluxation of the cervical spine.
3. Lower cervical degenerative disease with reversal of lordotic
curvature.

## 2024-03-21 ENCOUNTER — Ambulatory Visit: Payer: Self-pay | Admitting: Gynecologic Oncology

## 2024-03-21 DIAGNOSIS — N949 Unspecified condition associated with female genital organs and menstrual cycle: Secondary | ICD-10-CM

## 2024-03-29 ENCOUNTER — Telehealth: Payer: Self-pay | Admitting: *Deleted

## 2024-03-29 ENCOUNTER — Telehealth: Payer: Self-pay | Admitting: Oncology

## 2024-03-29 NOTE — Telephone Encounter (Signed)
-----   Message from Comer Dollar, MD sent at 03/29/2024 10:57 AM EST ----- Could one of you please call the patient to pass along my message - it appears she has not read it. Thanks!

## 2024-03-29 NOTE — Telephone Encounter (Signed)
 Spoke with patient and relayed message from Dr. Viktoria. That on ultrasound ovaries look stable -both in terms of the cysts themselves and the size of the cysts. Overall, no features to suggest a precancer or cancer. If you continue to wish to avoid surgery, I would suggest that we repeat an ultrasound in 6-9 months.   Pt verbalized understanding and wishes to avoid surgery at this time and agrees to a repeat ultrasound in 6-9 months. Advised patient the office will call back once we have her ultrasound scheduled. Pt thanked the office for calling.

## 2024-03-29 NOTE — Telephone Encounter (Signed)
 Lonell called back and would like to have the US  scheduled at [facility] if possible.  Advised her that we will call her back with an appointment.

## 2024-03-29 NOTE — Telephone Encounter (Signed)
Left a message regarding [place] results.  Requested a return call.

## 2024-03-29 NOTE — Telephone Encounter (Signed)
 Spoke with patient and per her request an appointment for her pelvic ultrasound has been scheduled for Monday, August 3 rd. At 1100 at [facility] Location.  Pt is also aware that she needs to drink 32 oz. Of water 1 hour prior and arrive with full bladder.  Pt thanked the office for calling.

## 2024-05-22 ENCOUNTER — Inpatient Hospital Stay

## 2024-05-29 ENCOUNTER — Inpatient Hospital Stay

## 2024-07-06 ENCOUNTER — Other Ambulatory Visit (HOSPITAL_COMMUNITY)

## 2024-07-10 ENCOUNTER — Encounter: Payer: Self-pay | Admitting: Nurse Practitioner

## 2024-07-12 ENCOUNTER — Telehealth: Admitting: Gynecologic Oncology

## 2024-10-23 ENCOUNTER — Other Ambulatory Visit

## 2025-02-21 ENCOUNTER — Ambulatory Visit: Payer: Self-pay | Admitting: Nurse Practitioner

## 2025-02-21 ENCOUNTER — Ambulatory Visit: Payer: Self-pay
